# Patient Record
Sex: Female | Born: 1960 | Race: White | Smoking: Current every day smoker
Health system: Southern US, Community
[De-identification: ages and names within clinical notes are randomized; demographics above are authoritative.]

## PROBLEM LIST (undated history)

## (undated) ENCOUNTER — Encounter

## (undated) ENCOUNTER — Ambulatory Visit

## (undated) ENCOUNTER — Telehealth

## (undated) ENCOUNTER — Ambulatory Visit: Payer: Medicare (Managed Care)

## (undated) ENCOUNTER — Encounter
Attending: Student in an Organized Health Care Education/Training Program | Primary: Student in an Organized Health Care Education/Training Program

## (undated) ENCOUNTER — Ambulatory Visit: Payer: MEDICARE

## (undated) ENCOUNTER — Ambulatory Visit
Payer: MEDICARE | Attending: Student in an Organized Health Care Education/Training Program | Primary: Student in an Organized Health Care Education/Training Program

## (undated) DIAGNOSIS — R5383 Other fatigue: Secondary | ICD-10-CM

## (undated) DIAGNOSIS — Z9889 Other specified postprocedural states: Secondary | ICD-10-CM

## (undated) DIAGNOSIS — M1990 Unspecified osteoarthritis, unspecified site: Secondary | ICD-10-CM

## (undated) DIAGNOSIS — M797 Fibromyalgia: Secondary | ICD-10-CM

## (undated) DIAGNOSIS — K219 Gastro-esophageal reflux disease without esophagitis: Secondary | ICD-10-CM

## (undated) DIAGNOSIS — N200 Calculus of kidney: Secondary | ICD-10-CM

## (undated) DIAGNOSIS — F32A Depression, unspecified: Secondary | ICD-10-CM

## (undated) DIAGNOSIS — I639 Cerebral infarction, unspecified: Secondary | ICD-10-CM

## (undated) DIAGNOSIS — I729 Aneurysm of unspecified site: Secondary | ICD-10-CM

## (undated) DIAGNOSIS — T783XXA Angioneurotic edema, initial encounter: Secondary | ICD-10-CM

## (undated) DIAGNOSIS — M150 Primary generalized (osteo)arthritis: Secondary | ICD-10-CM

## (undated) DIAGNOSIS — F329 Major depressive disorder, single episode, unspecified: Secondary | ICD-10-CM

## (undated) DIAGNOSIS — R42 Dizziness and giddiness: Secondary | ICD-10-CM

## (undated) DIAGNOSIS — N311 Reflex neuropathic bladder, not elsewhere classified: Secondary | ICD-10-CM

## (undated) DIAGNOSIS — Z8489 Family history of other specified conditions: Secondary | ICD-10-CM

## (undated) DIAGNOSIS — Z789 Other specified health status: Secondary | ICD-10-CM

## (undated) DIAGNOSIS — R0602 Shortness of breath: Secondary | ICD-10-CM

## (undated) DIAGNOSIS — I471 Supraventricular tachycardia: Secondary | ICD-10-CM

## (undated) DIAGNOSIS — G43909 Migraine, unspecified, not intractable, without status migrainosus: Secondary | ICD-10-CM

## (undated) DIAGNOSIS — F419 Anxiety disorder, unspecified: Secondary | ICD-10-CM

## (undated) DIAGNOSIS — R51 Headache: Secondary | ICD-10-CM

## (undated) DIAGNOSIS — R112 Nausea with vomiting, unspecified: Secondary | ICD-10-CM

## (undated) HISTORY — DX: Major depressive disorder, single episode, unspecified: F32.9

## (undated) HISTORY — DX: Fibromyalgia: M79.7

## (undated) HISTORY — PX: URETHRA SURGERY: SHX824

## (undated) HISTORY — DX: Cerebral infarction, unspecified: I63.9

## (undated) HISTORY — DX: Dizziness and giddiness: R42

## (undated) HISTORY — DX: Depression, unspecified: F32.A

## (undated) HISTORY — DX: Calculus of kidney: N20.0

## (undated) HISTORY — PX: KIDNEY STONE SURGERY: SHX686

## (undated) HISTORY — PX: BREAST CYST EXCISION: SHX579

## (undated) HISTORY — PX: COLONOSCOPY W/ BIOPSIES AND POLYPECTOMY: SHX1376

## (undated) HISTORY — DX: Angioneurotic edema, initial encounter: T78.3XXA

## (undated) HISTORY — DX: Shortness of breath: R06.02

## (undated) HISTORY — PX: HEMORROIDECTOMY: SUR656

## (undated) HISTORY — PX: TOTAL ABDOMINAL HYSTERECTOMY: SHX209

## (undated) HISTORY — DX: Primary generalized (osteo)arthritis: M15.0

## (undated) HISTORY — DX: Other specified health status: Z78.9

## (undated) HISTORY — DX: Anxiety disorder, unspecified: F41.9

## (undated) HISTORY — DX: Unspecified osteoarthritis, unspecified site: M19.90

## (undated) HISTORY — DX: Reflex neuropathic bladder, not elsewhere classified: N31.1

## (undated) HISTORY — DX: Other fatigue: R53.83

## (undated) HISTORY — DX: Migraine, unspecified, not intractable, without status migrainosus: G43.909

## (undated) HISTORY — DX: Headache: R51

---
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
## 1998-06-13 HISTORY — PX: ABDOMINAL HYSTERECTOMY: SHX81

## 2009-06-13 DIAGNOSIS — G459 Transient cerebral ischemic attack, unspecified: Secondary | ICD-10-CM

## 2009-06-13 DIAGNOSIS — I639 Cerebral infarction, unspecified: Secondary | ICD-10-CM

## 2009-06-13 HISTORY — PX: CARDIOVASCULAR STRESS TEST: SHX262

## 2009-06-13 HISTORY — DX: Cerebral infarction, unspecified: I63.9

## 2009-06-13 HISTORY — DX: Transient cerebral ischemic attack, unspecified: G45.9

## 2011-06-30 ENCOUNTER — Encounter: Payer: Self-pay | Admitting: Cardiovascular Disease

## 2011-06-30 ENCOUNTER — Ambulatory Visit (INDEPENDENT_AMBULATORY_CARE_PROVIDER_SITE_OTHER): Payer: Self-pay | Admitting: Cardiovascular Disease

## 2011-06-30 VITALS — BP 124/78 | HR 96 | Ht 63.5 in | Wt 140.8 lb

## 2011-06-30 DIAGNOSIS — R600 Localized edema: Secondary | ICD-10-CM | POA: Insufficient documentation

## 2011-06-30 DIAGNOSIS — R609 Edema, unspecified: Secondary | ICD-10-CM

## 2011-06-30 NOTE — Progress Notes (Signed)
Kara Ochoa Date of Birth  Dec 22, 1960 [facility]     Kara Ochoa  [address]    [address]   [address] [place], [place]  [address]    [place], [place]  [address] [phone]  Fax  [phone]  [phone]  Fax [phone]   History of Present Illness:  Kara Ochoa is a 51 yo with a hx of fibromyalgia and migraines,  and recent onset total body edema ( arms, hands, legs, ankles) .    She has fairly healthy with the exception of her osteoarthritis. She's gained 40 pounds this past year. Since that time she's had lots of swelling in arms, hands, legs, and ankles.  A venous duplex was negative for DVT.  CT angiogram to chest was negative for pulmonary embolus.  She's had an outside echocardiogram which revealed normal left systolic function with an ejection fraction of 60%. There is no evidence of pulmonary hypertension. There was no ASD or VSD. There was mild tricuspid regurgitation and trace mitral regurgitation.  Her weight gain has been slow and progressive for the past year.   She was given Lasix to take PRN for some leg swelling.  She has intertmittant chest pains .  She has DOE.  She does not get any regular exercise.  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  She's on her feet walking and checking machines 12 hours a day.  She does not get any regular aerobic exercise. She admits to eating a bit of fast food. She smokes about a pack of cigarettes a day.   Current Outpatient Prescriptions  Medication Sig Dispense Refill  . carisoprodol (SOMA) 350 MG tablet Take 350 mg by mouth daily.      . celecoxib (CELEBREX) 200 MG capsule Take 200 mg by mouth 2 (two) times daily.      . cyclobenzaprine (FLEXERIL) 10 MG tablet Take 10 mg by mouth 3 (three) times daily as needed.      . eletriptan (RELPAX) 20 MG tablet One tablet by mouth as needed for migraine headache.  If the headache improves and then returns, dose may be repeated after 2 hours  have elapsed since first dose (do not exceed 80 mg per day). may repeat in 2 hours if necessary      . eletriptan (RELPAX) 40 MG tablet One tablet by mouth as needed for migraine headache.  If the headache improves and then returns, dose may be repeated after 2 hours have elapsed since first dose (do not exceed 80 mg per day). may repeat in 2 hours if necessary      . estradiol (CLIMARA) 0.1 mg/24hr Place 1 patch onto the skin once a week.      Marland Kitchen LORazepam (ATIVAN) 1 MG tablet Take 1 mg by mouth 3 (three) times daily.      Marland Kitchen oxyCODONE (OXYCONTIN) 10 MG 12 hr tablet Take 10 mg by mouth every 12 (twelve) hours as needed.          Allergies  Allergen Reactions  . Penicillins   . Sulfa Drugs Cross Reactors     Past Medical History  Diagnosis Date  . Chest pain     Pressure  . SOB (shortness of breath)   . Lightheadedness   . Headache   . Mini stroke 2011  . Allergy history unknown   . Constipation   . Fatigue   . Depression   . Arthritis   . Anxiety  Past Surgical History  Procedure Date  . Cardiovascular stress test 2011    with IV   . Abdominal hysterectomy 2000    History  Smoking status  . Current Everyday Smoker  Smokeless tobacco  . Not on file    History  Alcohol Use: Not on file    No family history on file.  Reviw of Systems:  Reviewed in the HPI.  All other systems are negative.  Physical Exam: BP 124/78  Pulse 96  Ht 5' 3.5" (1.613 m)  Wt 140 lb 12.8 oz (63.866 kg)  BMI 24.55 kg/m2 The patient is alert and oriented x 3.  The mood and affect are normal.   Skin: warm and dry.  Color is normal.    HEENT:   Normocephalic/atraumatic. She has normal carotids. Neck is supple. There is no JVD.  Lungs: Her lungs are clear.   Heart: Regular rate S1-S2. There is a soft systolic murmur. There is no S3 gallop.    Abdomen: Good bowel sounds. There is no hepatosplenomegaly.  Extremities:  No clubbing cyanosis or edema. Her distal pulses are quite  good.  Neuro:  Her exam is nonfocal. Her gait is normal the    ECG: Normal sinus rhythm. She is nonspecific ST-T wave abnormalities.  Assessment / Plan:

## 2011-06-30 NOTE — Patient Instructions (Signed)
Your physician recommends that you schedule a follow-up appointment in: AS NEEDED BASIS  

## 2011-06-30 NOTE — Assessment & Plan Note (Signed)
Kara Ochoa presents with multiple vague complaints but the primary cardiac question is what to do about her leg edema. She has intermittent edema which I suspect is due to the fact that she stands on her feet for 12 hours a day. She also smokes cigarettes he needs extra salt in the form of fast foods. Her echocardiogram reveals normal left ventricular systolic function. She has mild tricuspid regurgitation but no significant abnormalities that would indicate any real cardiac problem.  I think that her edema is cosmetic edema. I do not think that it needs regular therapy. We discussed the appropriate therapies including leg elevation, compression hose and ambulation. Her medical doctor has given her some Lasix and I think that is fine for temporary [place] but I don't think that is a good long-term solution. She's had venous duplex scan which was negative for DVT. She's also had a CT angio  which is negative for pulmonary embolus.  At this point I don't think that she needs any further cardiac workup. I'll see her again on as-needed basis.

## 2016-12-07 ENCOUNTER — Encounter: Payer: Self-pay | Admitting: Internal Medicine

## 2016-12-07 ENCOUNTER — Ambulatory Visit (INDEPENDENT_AMBULATORY_CARE_PROVIDER_SITE_OTHER): Payer: 59 | Admitting: Internal Medicine

## 2016-12-07 VITALS — BP 110/71 | HR 58 | Ht 63.0 in | Wt 137.4 lb

## 2016-12-07 DIAGNOSIS — R011 Cardiac murmur, unspecified: Secondary | ICD-10-CM | POA: Diagnosis not present

## 2016-12-07 DIAGNOSIS — R0602 Shortness of breath: Secondary | ICD-10-CM

## 2016-12-07 DIAGNOSIS — R079 Chest pain, unspecified: Secondary | ICD-10-CM

## 2016-12-07 DIAGNOSIS — R002 Palpitations: Secondary | ICD-10-CM | POA: Diagnosis not present

## 2016-12-07 NOTE — Patient Instructions (Addendum)
Your physician has requested that you have an echocardiogram @ [address]. Echocardiography is a painless test that uses sound waves to create images of your heart. It provides your doctor with information about the size and shape of your heart and how well your heart's chambers and valves are working. This procedure takes approximately one hour. There are no restrictions for this procedure.  Your physician has requested that you have a lexiscan myoview @ [address]. For further information please visit [email or web address]. Please follow instruction sheet, as given.  Your physician has recommended that you wear an event monitor for 2 weeks - placed @ [address]. Event monitors are medical devices that record the heart's electrical activity. Doctors most often us these monitors to diagnose arrhythmias. Arrhythmias are problems with the speed or rhythm of the heartbeat. The monitor is a small, portable device. You can wear one while you do your normal daily activities. This is usually used to diagnose what is causing palpitations/syncope (passing out).  Your physician recommends that you schedule a follow-up appointment after your testing.   Your physician has recommended you make the following change in your medication:  -- DECREASE metoprolol tartrate to 25mg  twice daily (cut 50mg  tablets in half & take half tablet twice daily)

## 2016-12-07 NOTE — Progress Notes (Signed)
OFFICE CONSULT NOTE  Chief Complaint:  Fluttering, chest pain, fatigue  Primary Care Physician: Shelbie AmmonsHaque, Imran P, MD  HPI:  Kara Ochoa is a 56 y.o. female who is being seen today for the evaluation of fluttering, chest pain, fatigue at the request of Delray AltAnkita Desai, PA. [name] has a complex medical history significant for depression, anxiety, fibromyalgia, chronic headaches, constipation, recent SIBO, history of multiple TIAs and 1 reported stroke, osteoarthritis and recent symptoms of shortness of breath, left chest pain and palpitations. She recently presented to her primary care provider with symptoms of left chest pain and worsening shortness of breath she reported fluttering in her chest. An EKG was performed which was interpreted by the computer as sinus rhythm at 81 with extensive ST and T-wave changes possibly due to ischemia. The EKG is circled in several leads indicating what was interpreted as a flutter waves and the patient was referred to primarily for atrial flutter. I personally reviewed this EKG and disagree with the provider's assessment. I do agree with the computer interpretation that this is a sinus rhythm. I suspect what we were saying was baseline lead artifact. We repeated an EKG today which shows sinus bradycardia and nonspecific ST changes at 58. Heart rate is about 30 points lower this is likely related to her being started on high-dose metoprolol 50 mg twice daily. She was also placed on aspirin which she had previously taken that was removed by her gastroenterologist. This was primarily for stroke prevention given her history of TIA. Incidentally, if this were to be atrial flutter, her CHADSVASC score would likely be 5 or higher based on her TIA/stroke history and being female. Aspirin would not be sufficient anticoagulation to prevent stroke. She subsequently is reporting chest pain. She's describes this as over the left chest her left breast and squeezes her left breast  to describes the symptom. She reports the pain is deep, nonradiating, not worse with exertion or relieved by rest, rated as a 7 or 8 out of 10. She says it comes very intermittently. No clear alleviating factors. There is some associated fatigue recently as well.  PMHx:  Past Medical History:  Diagnosis Date  . Allergy history unknown   . Anxiety   . Arthritis   . Chest pain    Pressure  . Constipation   . Depression   . Fatigue   . Fibromyalgia   . Headache(784.0)   . Kidney stone   . Lightheadedness   . Migraines   . Mini stroke (HCC) 2011  . OA (osteoarthritis)   . SOB (shortness of breath)     Past Surgical History:  Procedure Laterality Date  . ABDOMINAL HYSTERECTOMY  2000  . CARDIOVASCULAR STRESS TEST  2011   with IV     FAMHx:  Family History  Problem Relation Age of Onset  . Cancer Mother   . Depression Mother   . Valvular heart disease Mother        mitral valve reflux  . Other Father        degenerative bone   . Colon cancer Maternal Grandmother   . Stroke Maternal Grandfather   . Thyroid disease Daughter   . Basal cell carcinoma Daughter     SOCHx:   reports that she has been smoking.  She has smoked for the past 38.00 years. She has never used smokeless tobacco. She reports that she does not drink alcohol or use drugs.  ALLERGIES:  Allergies  Allergen Reactions  .  Bee Venom Swelling    Respiratory   . Codeine Hives  . Zonisamide Anaphylaxis    Headache medication  . Penicillins Hives  . Sulfa Drugs Cross Reactors Swelling    Throat swelling   . Hydrocodone Nausea And Vomiting    ROS: Pertinent items noted in HPI and remainder of comprehensive ROS otherwise negative.  HOME MEDS: No current outpatient prescriptions on file prior to visit.   No current facility-administered medications on file prior to visit.     LABS/IMAGING: No results found for this or any previous visit (from the past 48 hour(s)). No results found.  LIPID  PANEL: No results found for: CHOL, TRIG, HDL, CHOLHDL, VLDL, LDLCALC, LDLDIRECT  WEIGHTS: Wt Readings from Last 3 Encounters:  12/07/16 137 lb 6.4 oz (62.3 kg)  06/30/11 140 lb 12.8 oz (63.9 kg)    VITALS: BP 110/71   Pulse (!) 58   Ht 5\' 3"  (1.6 m)   Wt 137 lb 6.4 oz (62.3 kg)   BMI 24.34 kg/m   EXAM: General appearance: alert and no distress Neck: no carotid bruit and no JVD Lungs: clear to auscultation bilaterally Heart: regular rate and rhythm, S1, S2 normal and systolic murmur: early systolic 2/6, blowing at apex Abdomen: soft, non-tender; bowel sounds normal; no masses,  no organomegaly Extremities: extremities normal, atraumatic, no cyanosis or edema Pulses: 2+ and symmetric Skin: Skin color, texture, turgor normal. No rashes or lesions Neurologic: Grossly normal Psych: Pleasant, mildly anxious  EKG: Sinus bradycardia 58, nonspecific ST changes - personally reviewed  ASSESSMENT: 1. Dyspnea and exertion, fatigue, palpitations - left chest pain 2. Inconclusive evidence of atrial flutter 3. Depression/anxiety/fibromyalgia 4. History of recurrent TIA/stroke-unknown etiology  PLAN: 1.   [name] reports worsening dyspnea on exertion, fatigue and palpitations. She is also describing left-sided chest pain. It's not clear this is anginal however she does have cardiac risk factors and prior history of TIA/stroke. At this point I agree with starting low-dose aspirin and continuing it. I do believe she is on too high of a dose of metoprolol. 50 mg twice a day is a very high starting dose for somebody his beta blocker nave. I advised decreasing that dose to 25 mg twice a day. We will plan an echocardiogram to look for structural changes to explain her dyspnea and left-sided chest pain. I also like to obtain a Lexiscan Myoview's issues and unable to walk on a treadmill to evaluate for any ischemic causes of her chest pain. Finally with regard to palpitations, I disagree with the  interpretation of her EKG indicating atrial flutter. This is more likely to be baseline lead artifact. If one follows the contiguous V2 lead tracing you can see that the atrial flutter ceases prior to the end of the 12-lead with clear P-wave morphologies that are larger than the "presumed flutter waves". The computer interpretation was sinus rhythm as well. We'll plan a 2 week monitor nonetheless.  Follow-up with me afterwards. Thanks as eyes for the kind referral.  TIME: I have spent a total of 60 minutes with patient reviewing hospital notes, telemetry, EKGs, labs and examining the patient as well as establishing an assessment and plan that was discussed with the patient. > 50% of time was spent in direct patient care.   Chrystie Nose, MD, [facility]  [place]  [facility]  Attending Cardiologist  Direct Dial: [phone]  Fax: [phone]  Website:  [email or web address] Nicely Garry Bochicchio 12/07/2016, 5:14 PM

## 2016-12-08 ENCOUNTER — Telehealth: Payer: Self-pay | Admitting: Internal Medicine

## 2016-12-08 NOTE — Telephone Encounter (Signed)
Mrs.Cheema is calling because she has questions about her Lexiscan . Please call

## 2016-12-08 NOTE — Telephone Encounter (Signed)
Phone number on file is wrong. Returned call but goes to another person.

## 2016-12-09 NOTE — Telephone Encounter (Signed)
Number listed was the wrong number. Verified with person spoke with and she stated she had that number for 2 years and it was incorrect, took out of Epic. Called remaining number listed for patient and recording stating call can not be completed.

## 2016-12-12 NOTE — Telephone Encounter (Signed)
Unable to reach pt or leave a message  

## 2016-12-13 ENCOUNTER — Telehealth (HOSPITAL_COMMUNITY): Payer: Self-pay

## 2016-12-13 NOTE — Telephone Encounter (Signed)
Close encounter 

## 2016-12-15 ENCOUNTER — Telehealth (HOSPITAL_COMMUNITY): Payer: Self-pay

## 2016-12-15 NOTE — Telephone Encounter (Signed)
Encounter complete. 

## 2016-12-16 ENCOUNTER — Ambulatory Visit (HOSPITAL_COMMUNITY)
Admission: RE | Admit: 2016-12-16 | Payer: Medicare Other | Source: Ambulatory Visit | Attending: Internal Medicine | Admitting: Internal Medicine

## 2016-12-19 ENCOUNTER — Other Ambulatory Visit (HOSPITAL_COMMUNITY): Payer: PRIVATE HEALTH INSURANCE

## 2016-12-21 ENCOUNTER — Other Ambulatory Visit: Payer: Self-pay

## 2016-12-21 ENCOUNTER — Ambulatory Visit (HOSPITAL_COMMUNITY): Payer: Medicare Other | Attending: Cardiovascular Disease

## 2016-12-21 ENCOUNTER — Ambulatory Visit (INDEPENDENT_AMBULATORY_CARE_PROVIDER_SITE_OTHER): Payer: Medicare Other

## 2016-12-21 DIAGNOSIS — R002 Palpitations: Secondary | ICD-10-CM | POA: Diagnosis not present

## 2016-12-21 DIAGNOSIS — R011 Cardiac murmur, unspecified: Secondary | ICD-10-CM | POA: Diagnosis present

## 2016-12-21 DIAGNOSIS — R079 Chest pain, unspecified: Secondary | ICD-10-CM | POA: Insufficient documentation

## 2016-12-21 DIAGNOSIS — R0602 Shortness of breath: Secondary | ICD-10-CM | POA: Diagnosis not present

## 2016-12-26 ENCOUNTER — Encounter (HOSPITAL_COMMUNITY): Payer: Self-pay | Admitting: Internal Medicine

## 2017-01-02 ENCOUNTER — Ambulatory Visit: Payer: Medicare Other | Admitting: Internal Medicine

## 2017-01-04 ENCOUNTER — Telehealth (HOSPITAL_COMMUNITY): Payer: Self-pay | Admitting: Internal Medicine

## 2017-01-04 NOTE — Telephone Encounter (Signed)
User: Kara Ochoa, [name] A Date/time: 12/26/2016 1:59 PM  Comment: Called pt and her phone is still disconnected so a letter will be mailed out today.   Context: [place] Schedule Orders/Appt Requests Outcome: Not Available  Phone number: [phone] Phone Type: Home Phone  Comm. type: Telephone Call type: Outgoing  Contact: [name], [name] Relation to patient: Self  Letter:      User: Kara Ochoa, [name] A Date/time: 12/20/2016 2:40 PM  Comment: Patient's number is disconnected.  Context: [place] Schedule Orders/Appt Requests Outcome: Missing or Invalid Number  Phone number: [phone] Phone Type: Home Phone  Comm. type: Telephone Call type: Outgoing  Contact: [name], [name] Relation to patient: Self  Letter:       She will be removed from the workqueue.

## 2017-01-05 ENCOUNTER — Ambulatory Visit: Payer: Medicare Other | Admitting: Cardiology

## 2017-01-05 ENCOUNTER — Ambulatory Visit: Payer: Medicare Other | Admitting: Internal Medicine

## 2017-01-06 ENCOUNTER — Telehealth (HOSPITAL_COMMUNITY): Payer: Self-pay | Admitting: Internal Medicine

## 2017-01-06 NOTE — Telephone Encounter (Signed)
Echo was reassuring with normal systolic and diastolic function. We'll proceed without the stress test.  Dr. HRexene Edison

## 2017-01-06 NOTE — Telephone Encounter (Signed)
Patient returned my call in regards to scheduling her Lexiscan. She stated that after calling her insurance to see how much she would be responsible for, that amount came to $1200. She stated that she can not afford that. Please advise.

## 2017-01-06 NOTE — Telephone Encounter (Signed)
Spoke with patient. She cannot afford lexiscan. Advised would need to defer to MD to see if further testing is warranted. Had normal echo.   Message routed

## 2017-01-06 NOTE — Telephone Encounter (Signed)
Follow up   Pt calling back - says to leave the message on the phone because she cant answer

## 2017-01-06 NOTE — Telephone Encounter (Signed)
LMTCB

## 2017-01-09 NOTE — Telephone Encounter (Signed)
Left message for patient that MD said is OK to proceed without stress testing based on echo results

## 2017-01-13 ENCOUNTER — Telehealth: Payer: Self-pay | Admitting: Internal Medicine

## 2017-01-13 NOTE — Telephone Encounter (Signed)
Patient would like to change from Dr. Rennis GoldenHilty  to Dr. Tomie Chinaevankar in the [facility] office. Is this ok?

## 2017-01-16 NOTE — Telephone Encounter (Signed)
Ok with me to switch providers top Dr. Josiah Loboevenkar if she wants.  Dr. Rennis GoldenHilty

## 2017-01-26 ENCOUNTER — Ambulatory Visit: Admission: RE | Admit: 2017-01-26 | Discharge: 2017-01-26 | Disposition: A | Discharge status: Home / Self Care

## 2017-01-26 DIAGNOSIS — M79672 Pain in left foot: Principal | ICD-10-CM

## 2017-01-27 ENCOUNTER — Encounter: Payer: Self-pay | Admitting: Internal Medicine

## 2017-02-02 MED ORDER — OMEPRAZOLE 40 MG CAPSULE,DELAYED RELEASE
ORAL_CAPSULE | 2 refills | 0 days | Status: CP
Start: 2017-02-02 — End: 2017-10-09

## 2017-02-23 NOTE — Telephone Encounter (Signed)
Called and left message for patient to call office on numerous occasions and she has not returned call.Marland Kitchen..Marland Kitchen

## 2017-10-09 MED ORDER — OMEPRAZOLE 40 MG CAPSULE,DELAYED RELEASE
ORAL_CAPSULE | 1 refills | 0 days | Status: CP
Start: 2017-10-09 — End: 2017-11-15

## 2017-11-07 ENCOUNTER — Other Ambulatory Visit (HOSPITAL_COMMUNITY): Payer: Self-pay | Admitting: Interventional Radiology

## 2017-11-07 ENCOUNTER — Telehealth (HOSPITAL_COMMUNITY): Payer: Self-pay

## 2017-11-07 DIAGNOSIS — I671 Cerebral aneurysm, nonruptured: Secondary | ICD-10-CM

## 2017-11-07 NOTE — Telephone Encounter (Signed)
Called to schedule consult, no answer, left vm. AW  

## 2017-11-09 ENCOUNTER — Ambulatory Visit (HOSPITAL_COMMUNITY)
Admission: RE | Admit: 2017-11-09 | Discharge: 2017-11-09 | Disposition: A | Discharge status: Home / Self Care | Payer: Medicare PPO | Source: Ambulatory Visit | Attending: Interventional Radiology | Admitting: Interventional Radiology

## 2017-11-09 DIAGNOSIS — I671 Cerebral aneurysm, nonruptured: Secondary | ICD-10-CM

## 2017-11-09 HISTORY — PX: IR RADIOLOGIST EVAL & MGMT: IMG5224

## 2017-11-10 ENCOUNTER — Encounter (HOSPITAL_COMMUNITY): Payer: Self-pay | Admitting: Interventional Radiology

## 2017-11-10 ENCOUNTER — Other Ambulatory Visit (HOSPITAL_COMMUNITY): Payer: Self-pay | Admitting: Interventional Radiology

## 2017-11-10 DIAGNOSIS — I671 Cerebral aneurysm, nonruptured: Secondary | ICD-10-CM

## 2017-11-11 HISTORY — PX: BRAIN SURGERY: SHX531

## 2017-11-15 ENCOUNTER — Telehealth: Payer: Self-pay | Admitting: Student

## 2017-11-15 MED ORDER — PANTOPRAZOLE 40 MG TABLET,DELAYED RELEASE
ORAL_TABLET | Freq: Two times a day (BID) | ORAL | 1 refills | 0.00000 days | Status: CP
Start: 2017-11-15 — End: 2018-02-13

## 2017-11-15 NOTE — Telephone Encounter (Signed)
Received message from patient regarding Plavix use. Patient was seen in consultation with Dr. Corliss Skainseveshwar 11/09/2017 for management of her right paraophthalmic aneurysm. She is planned for intervention with Dr. Corliss Skainseveshwar 11/23/2017. Patient is currently taking Plavix 75 mg once daily and Aspirin 81 mg once daily.  Patient complains of a sore throat and "choaking" feeling that she believes is due to her Plavix use. States she began taking Plavix Saturday 11/11/2017. States that Sunday 11/12/2017 she developed a sore throat with associated "choaking" feeling. Patient states that when she lays flat, the "choaking" feeling worsens. States she has been taking Plavix once daily since Saturday 11/11/2017. States the sore throat and "choaking" feeling have been stable since Sunday 11/12/2017.  Patient also complains of GERD flare up. States that she was told by Dr. Corliss Skainseveshwar to discontinue Omeprazole due to her Plavix use. States she was supposed to receive a prescription for another GERD medication, but never did.  Discussed case with Dr. Corliss Skainseveshwar.  Informed patient, per Dr. Corliss Skainseveshwar, to discontinue Plavix use immediately. Informed patient that if she does not feel better in 2 days, to see her PCP. Informed patient to call us in 2 days to update her on how she is feeling and we will make a plan regarding her intervention. Informed patient that if her symptoms worsen to go to the ED immediately for evaluation.  Informed patient, per Dr. Corliss Skainseveshwar, that she can restart taking Omeprazole as directed for GERD, as she will no longer be on Plavix.  All questions answered and concerns addressed. Patient conveys understanding and agrees with plan.  Waylan Bogalexandra M Jamien Casanova, PA-C 11/15/2017, 3:47 PM

## 2017-11-16 ENCOUNTER — Other Ambulatory Visit (HOSPITAL_COMMUNITY): Payer: Self-pay

## 2017-11-16 DIAGNOSIS — K219 Gastro-esophageal reflux disease without esophagitis: Secondary | ICD-10-CM | POA: Diagnosis not present

## 2017-11-16 DIAGNOSIS — I671 Cerebral aneurysm, nonruptured: Secondary | ICD-10-CM | POA: Diagnosis not present

## 2017-11-16 DIAGNOSIS — M940 Chondrocostal junction syndrome [Tietze]: Secondary | ICD-10-CM | POA: Diagnosis not present

## 2017-11-16 DIAGNOSIS — Z86010 Personal history of colonic polyps: Secondary | ICD-10-CM | POA: Diagnosis not present

## 2017-11-16 DIAGNOSIS — Z7982 Long term (current) use of aspirin: Secondary | ICD-10-CM | POA: Diagnosis not present

## 2017-11-16 DIAGNOSIS — Z87442 Personal history of urinary calculi: Secondary | ICD-10-CM | POA: Diagnosis not present

## 2017-11-16 DIAGNOSIS — Z91030 Bee allergy status: Secondary | ICD-10-CM | POA: Diagnosis not present

## 2017-11-16 DIAGNOSIS — I729 Aneurysm of unspecified site: Secondary | ICD-10-CM

## 2017-11-16 DIAGNOSIS — Z91018 Allergy to other foods: Secondary | ICD-10-CM | POA: Diagnosis not present

## 2017-11-16 DIAGNOSIS — F17210 Nicotine dependence, cigarettes, uncomplicated: Secondary | ICD-10-CM | POA: Diagnosis not present

## 2017-11-16 DIAGNOSIS — Z79899 Other long term (current) drug therapy: Secondary | ICD-10-CM | POA: Diagnosis not present

## 2017-11-16 DIAGNOSIS — Z7902 Long term (current) use of antithrombotics/antiplatelets: Secondary | ICD-10-CM | POA: Diagnosis not present

## 2017-11-16 DIAGNOSIS — M797 Fibromyalgia: Secondary | ICD-10-CM | POA: Diagnosis not present

## 2017-11-16 DIAGNOSIS — F329 Major depressive disorder, single episode, unspecified: Secondary | ICD-10-CM | POA: Diagnosis not present

## 2017-11-16 DIAGNOSIS — G43909 Migraine, unspecified, not intractable, without status migrainosus: Secondary | ICD-10-CM | POA: Diagnosis present

## 2017-11-16 DIAGNOSIS — Z818 Family history of other mental and behavioral disorders: Secondary | ICD-10-CM | POA: Diagnosis not present

## 2017-11-16 LAB — PLATELET INHIBITION P2Y12: Platelet Function  P2Y12: 1 [PRU] — ABNORMAL LOW (ref 194–418)

## 2017-11-21 ENCOUNTER — Telehealth (HOSPITAL_COMMUNITY): Payer: Self-pay | Admitting: *Deleted

## 2017-11-21 ENCOUNTER — Other Ambulatory Visit (HOSPITAL_COMMUNITY): Payer: Self-pay | Admitting: Radiology

## 2017-11-21 DIAGNOSIS — I729 Aneurysm of unspecified site: Secondary | ICD-10-CM

## 2017-11-21 LAB — PLATELET INHIBITION P2Y12: PLATELET FUNCTION P2Y12: 3 [PRU] — AB (ref 194–418)

## 2017-11-21 NOTE — Telephone Encounter (Signed)
called and spoke with patient.  Per Dr. Corliss Skainseveshwar pt needs to take Brilinta Every other day and ASA 81 mg daily.   Pt states she took Brilinta this am. Instructed patient to not take Brilinta until she comes in Thursday and we will recheck P2Y12.  And make decision based on lab work.

## 2017-11-22 ENCOUNTER — Other Ambulatory Visit: Payer: Self-pay | Admitting: Radiology

## 2017-11-22 ENCOUNTER — Other Ambulatory Visit: Payer: Self-pay

## 2017-11-22 ENCOUNTER — Encounter (HOSPITAL_COMMUNITY): Payer: Self-pay | Admitting: Certified Registered Nurse Anesthetist

## 2017-11-22 NOTE — Progress Notes (Signed)
Pt denies SOB, chest pain, and being under the care of a cardiologist. Pt denies having a cardiac cath. Pt made aware to stop taking vitamins, fish oil herbal medications, glucosamine, Apple Cider Vinegar tabs. Do not take any NSAIDs ie: Ibuprofen, Advil, Naproxen (Aleve), Motrin, BC and Goody Powder. Pt stated that she was not instructed to take ASA daily and Brilinta every other day. Pt stated that she was instructed to take a half of Brilinta along with Aspirin daily. Pt stated that she takes both Brilinta and Aspirin in the afternoon and will bring both medications in on the DOS. Pt stated that she was instructed to not check in at admitting but to go to the lab first. ?? [facility] Pt verbalized understanding of all pre-op instructions.

## 2017-11-23 ENCOUNTER — Inpatient Hospital Stay (HOSPITAL_COMMUNITY)
Admission: RE | Admit: 2017-11-23 | Discharge: 2017-11-25 | DRG: 027 | Disposition: A | Discharge status: Home / Self Care | Payer: Medicare Other | Attending: Interventional Radiology | Admitting: Interventional Radiology

## 2017-11-23 ENCOUNTER — Other Ambulatory Visit: Payer: Self-pay

## 2017-11-23 ENCOUNTER — Encounter (HOSPITAL_COMMUNITY)
Admission: RE | Disposition: A | Discharge status: Home / Self Care | Payer: Self-pay | Source: Home / Self Care | Attending: Interventional Radiology

## 2017-11-23 ENCOUNTER — Ambulatory Visit (HOSPITAL_COMMUNITY): Payer: Medicare Other | Admitting: Certified Registered Nurse Anesthetist

## 2017-11-23 ENCOUNTER — Encounter (HOSPITAL_COMMUNITY): Payer: Self-pay | Admitting: *Deleted

## 2017-11-23 ENCOUNTER — Ambulatory Visit (HOSPITAL_COMMUNITY)
Admission: RE | Admit: 2017-11-23 | Discharge: 2017-11-23 | Disposition: A | Discharge status: Home / Self Care | Payer: Medicare Other | Source: Ambulatory Visit | Attending: Interventional Radiology | Admitting: Interventional Radiology

## 2017-11-23 DIAGNOSIS — I671 Cerebral aneurysm, nonruptured: Principal | ICD-10-CM | POA: Diagnosis present

## 2017-11-23 DIAGNOSIS — Z818 Family history of other mental and behavioral disorders: Secondary | ICD-10-CM | POA: Diagnosis not present

## 2017-11-23 DIAGNOSIS — F17210 Nicotine dependence, cigarettes, uncomplicated: Secondary | ICD-10-CM | POA: Diagnosis not present

## 2017-11-23 DIAGNOSIS — Z7902 Long term (current) use of antithrombotics/antiplatelets: Secondary | ICD-10-CM | POA: Diagnosis not present

## 2017-11-23 DIAGNOSIS — F329 Major depressive disorder, single episode, unspecified: Secondary | ICD-10-CM | POA: Diagnosis present

## 2017-11-23 DIAGNOSIS — Z7982 Long term (current) use of aspirin: Secondary | ICD-10-CM

## 2017-11-23 DIAGNOSIS — Z87442 Personal history of urinary calculi: Secondary | ICD-10-CM | POA: Diagnosis not present

## 2017-11-23 DIAGNOSIS — M797 Fibromyalgia: Secondary | ICD-10-CM | POA: Diagnosis not present

## 2017-11-23 DIAGNOSIS — Z91030 Bee allergy status: Secondary | ICD-10-CM | POA: Diagnosis not present

## 2017-11-23 DIAGNOSIS — Z86010 Personal history of colonic polyps: Secondary | ICD-10-CM

## 2017-11-23 DIAGNOSIS — Z91018 Allergy to other foods: Secondary | ICD-10-CM

## 2017-11-23 DIAGNOSIS — G43909 Migraine, unspecified, not intractable, without status migrainosus: Secondary | ICD-10-CM | POA: Diagnosis present

## 2017-11-23 DIAGNOSIS — K219 Gastro-esophageal reflux disease without esophagitis: Secondary | ICD-10-CM | POA: Diagnosis not present

## 2017-11-23 DIAGNOSIS — M940 Chondrocostal junction syndrome [Tietze]: Secondary | ICD-10-CM | POA: Diagnosis present

## 2017-11-23 DIAGNOSIS — Z79899 Other long term (current) drug therapy: Secondary | ICD-10-CM

## 2017-11-23 HISTORY — DX: Gastro-esophageal reflux disease without esophagitis: K21.9

## 2017-11-23 HISTORY — PX: IR ANGIO VERTEBRAL SEL SUBCLAVIAN INNOMINATE UNI R MOD SED: IMG5365

## 2017-11-23 HISTORY — PX: IR ANGIO VERTEBRAL SEL VERTEBRAL UNI L MOD SED: IMG5367

## 2017-11-23 HISTORY — DX: Aneurysm of unspecified site: I72.9

## 2017-11-23 HISTORY — DX: Family history of other specified conditions: Z84.89

## 2017-11-23 HISTORY — DX: Other specified postprocedural states: Z98.890

## 2017-11-23 HISTORY — PX: IR ANGIO INTRA EXTRACRAN SEL INTERNAL CAROTID UNI R MOD SED: IMG5362

## 2017-11-23 HISTORY — PX: IR ANGIOGRAM FOLLOW UP STUDY: IMG697

## 2017-11-23 HISTORY — PX: IR 3D INDEPENDENT WKST: IMG2385

## 2017-11-23 HISTORY — PX: IR ANGIO INTRA EXTRACRAN SEL COM CAROTID INNOMINATE UNI L MOD SED: IMG5358

## 2017-11-23 HISTORY — DX: Other specified postprocedural states: R11.2

## 2017-11-23 HISTORY — PX: RADIOLOGY WITH ANESTHESIA: SHX6223

## 2017-11-23 HISTORY — DX: Cerebral infarction, unspecified: I63.9

## 2017-11-23 HISTORY — PX: IR TRANSCATH/EMBOLIZ: IMG695

## 2017-11-23 LAB — CBC WITH DIFFERENTIAL/PLATELET
ABS IMMATURE GRANULOCYTES: 0 10*3/uL (ref 0.0–0.1)
Basophils Absolute: 0.1 10*3/uL (ref 0.0–0.1)
Basophils Relative: 1 %
Eosinophils Absolute: 0.1 10*3/uL (ref 0.0–0.7)
Eosinophils Relative: 2 %
HEMATOCRIT: 39 % (ref 36.0–46.0)
Hemoglobin: 12.9 g/dL (ref 12.0–15.0)
IMMATURE GRANULOCYTES: 1 %
LYMPHS ABS: 3.4 10*3/uL (ref 0.7–4.0)
Lymphocytes Relative: 47 %
MCH: 32.4 pg (ref 26.0–34.0)
MCHC: 33.1 g/dL (ref 30.0–36.0)
MCV: 98 fL (ref 78.0–100.0)
MONO ABS: 0.7 10*3/uL (ref 0.1–1.0)
MONOS PCT: 9 %
NEUTROS ABS: 2.8 10*3/uL (ref 1.7–7.7)
NEUTROS PCT: 40 %
Platelets: 235 10*3/uL (ref 150–400)
RBC: 3.98 MIL/uL (ref 3.87–5.11)
RDW: 13.6 % (ref 11.5–15.5)
WBC: 7.1 10*3/uL (ref 4.0–10.5)

## 2017-11-23 LAB — BASIC METABOLIC PANEL
ANION GAP: 11 (ref 5–15)
BUN: 21 mg/dL — ABNORMAL HIGH (ref 6–20)
CHLORIDE: 107 mmol/L (ref 101–111)
CO2: 26 mmol/L (ref 22–32)
CREATININE: 0.73 mg/dL (ref 0.44–1.00)
Calcium: 8.9 mg/dL (ref 8.9–10.3)
GFR calc non Af Amer: >60 mL/min (ref >60)
Glucose, Bld: 97 mg/dL (ref 65–99)
Potassium: 3.4 mmol/L — ABNORMAL LOW (ref 3.5–5.1)
Sodium: 144 mmol/L (ref 135–145)

## 2017-11-23 LAB — PROTIME-INR
INR: 0.91
Prothrombin Time: 12.1 seconds (ref 11.4–15.2)

## 2017-11-23 LAB — POCT ACTIVATED CLOTTING TIME
ACTIVATED CLOTTING TIME: 158 s
ACTIVATED CLOTTING TIME: 202 s

## 2017-11-23 LAB — MRSA PCR SCREENING: MRSA by PCR: NEGATIVE

## 2017-11-23 LAB — PLATELET INHIBITION P2Y12
PLATELET FUNCTION P2Y12: 5 [PRU] — AB (ref 194–418)
Platelet Function  P2Y12: 227 [PRU] (ref 194–418)

## 2017-11-23 LAB — HEPARIN LEVEL (UNFRACTIONATED): Heparin Unfractionated: 0.11 IU/mL — ABNORMAL LOW (ref 0.30–0.70)

## 2017-11-23 SURGERY — IR WITH ANESTHESIA
Anesthesia: General

## 2017-11-23 MED ORDER — PANTOPRAZOLE SODIUM 40 MG PO TBEC: 40.0000 mg | DELAYED_RELEASE_TABLET | Freq: Every day | ORAL | Status: DC

## 2017-11-23 MED ORDER — SUGAMMADEX SODIUM 200 MG/2ML IV SOLN
INTRAVENOUS | Status: DC | PRN
  Administered 2017-11-23: 200 mg via INTRAVENOUS

## 2017-11-23 MED ORDER — HEPARIN (PORCINE) IN NACL 100-0.45 UNIT/ML-% IJ SOLN
INTRAMUSCULAR | Status: AC
  Filled 2017-11-23: qty 250

## 2017-11-23 MED ORDER — PHENYLEPHRINE HCL 10 MG/ML IJ SOLN
INTRAVENOUS | Status: DC | PRN
  Administered 2017-11-23: 50 ug/min via INTRAVENOUS

## 2017-11-23 MED ORDER — GABAPENTIN 100 MG PO CAPS: 100.0000 mg | ORAL_CAPSULE | Freq: Three times a day (TID) | ORAL | Status: DC

## 2017-11-23 MED ORDER — PROTAMINE SULFATE 10 MG/ML IV SOLN
INTRAVENOUS | Status: DC | PRN
  Administered 2017-11-23: 5 mg via INTRAVENOUS

## 2017-11-23 MED ORDER — ASPIRIN 81 MG PO CHEW
81.0000 mg | CHEWABLE_TABLET | Freq: Every day | ORAL | Status: DC
  Administered 2017-11-24 – 2017-11-25 (×2): 81 mg via ORAL
  Filled 2017-11-23 (×2): qty 1

## 2017-11-23 MED ORDER — DIVALPROEX SODIUM 250 MG PO DR TAB
250.0000 mg | DELAYED_RELEASE_TABLET | Freq: Every day | ORAL | Status: DC
  Administered 2017-11-24 – 2017-11-25 (×2): 250 mg via ORAL
  Filled 2017-11-23 (×2): qty 1

## 2017-11-23 MED ORDER — HEPARIN (PORCINE) IN NACL 100-0.45 UNIT/ML-% IJ SOLN
500.0000 [IU]/h | INTRAMUSCULAR | Status: DC
  Filled 2017-11-23: qty 250

## 2017-11-23 MED ORDER — MIDAZOLAM HCL 2 MG/2ML IJ SOLN
INTRAMUSCULAR | Status: AC
  Filled 2017-11-23: qty 2

## 2017-11-23 MED ORDER — PROMETHAZINE HCL 25 MG PO TABS: 25.0000 mg | ORAL_TABLET | Freq: Four times a day (QID) | ORAL | Status: DC | PRN

## 2017-11-23 MED ORDER — TICAGRELOR 90 MG PO TABS
180.0000 mg | ORAL_TABLET | ORAL | Status: AC
  Administered 2017-11-23: 180 mg via ORAL
  Filled 2017-11-23: qty 2

## 2017-11-23 MED ORDER — SODIUM CHLORIDE 0.9 % IV SOLN: INTRAVENOUS | Status: DC

## 2017-11-23 MED ORDER — ASPIRIN 81 MG PO CHEW
81.0000 mg | CHEWABLE_TABLET | Freq: Once | ORAL | Status: AC
  Administered 2017-11-23: 81 mg via ORAL
  Filled 2017-11-23: qty 1

## 2017-11-23 MED ORDER — TICAGRELOR 90 MG PO TABS: 90.0000 mg | ORAL_TABLET | Freq: Two times a day (BID) | ORAL | Status: DC

## 2017-11-23 MED ORDER — FENTANYL CITRATE (PF) 100 MCG/2ML IJ SOLN: 25.0000 ug | INTRAMUSCULAR | Status: DC | PRN

## 2017-11-23 MED ORDER — ONDANSETRON HCL 4 MG/2ML IJ SOLN
INTRAMUSCULAR | Status: DC | PRN
  Administered 2017-11-23: 4 mg via INTRAVENOUS

## 2017-11-23 MED ORDER — VANCOMYCIN HCL 1000 MG IV SOLR
1000.0000 mg | INTRAVENOUS | Status: AC
  Administered 2017-11-23: 1000 mg via INTRAVENOUS

## 2017-11-23 MED ORDER — NICARDIPINE HCL IN NACL 20-0.86 MG/200ML-% IV SOLN
0.0000 mg/h | INTRAVENOUS | Status: DC
  Filled 2017-11-23: qty 200

## 2017-11-23 MED ORDER — DEXAMETHASONE SODIUM PHOSPHATE 10 MG/ML IJ SOLN
INTRAMUSCULAR | Status: DC | PRN
  Administered 2017-11-23: 10 mg via INTRAVENOUS

## 2017-11-23 MED ORDER — HEPARIN SODIUM (PORCINE) 1000 UNIT/ML IJ SOLN
INTRAMUSCULAR | Status: DC | PRN
  Administered 2017-11-23 (×3): 500 [IU] via INTRAVENOUS
  Administered 2017-11-23: 1000 [IU] via INTRAVENOUS
  Administered 2017-11-23: 2000 [IU] via INTRAVENOUS

## 2017-11-23 MED ORDER — LIDOCAINE HCL 1 % IJ SOLN
INTRAMUSCULAR | Status: AC
  Filled 2017-11-23: qty 20

## 2017-11-23 MED ORDER — ACETAMINOPHEN 650 MG RE SUPP: 650.0000 mg | RECTAL | Status: DC | PRN

## 2017-11-23 MED ORDER — DIVALPROEX SODIUM 250 MG PO DR TAB: 250.0000 mg | DELAYED_RELEASE_TABLET | ORAL | Status: DC

## 2017-11-23 MED ORDER — VENLAFAXINE HCL ER 150 MG PO CP24: 150.0000 mg | ORAL_CAPSULE | Freq: Every day | ORAL | Status: DC

## 2017-11-23 MED ORDER — ASPIRIN 81 MG PO CHEW: 81.0000 mg | CHEWABLE_TABLET | Freq: Every day | ORAL | Status: DC

## 2017-11-23 MED ORDER — ACETAMINOPHEN 160 MG/5ML PO SOLN: 650.0000 mg | ORAL | Status: DC | PRN

## 2017-11-23 MED ORDER — SODIUM CHLORIDE 0.9 % IV SOLN
INTRAVENOUS | Status: DC
  Administered 2017-11-23 – 2017-11-24 (×2): via INTRAVENOUS

## 2017-11-23 MED ORDER — MIDAZOLAM HCL 5 MG/5ML IJ SOLN
INTRAMUSCULAR | Status: DC | PRN
  Administered 2017-11-23 (×3): 1 mg via INTRAVENOUS

## 2017-11-23 MED ORDER — VENLAFAXINE HCL ER 150 MG PO CP24
150.0000 mg | ORAL_CAPSULE | Freq: Every day | ORAL | Status: DC
  Administered 2017-11-23 – 2017-11-24 (×2): 150 mg via ORAL
  Filled 2017-11-23 (×3): qty 1

## 2017-11-23 MED ORDER — LIDOCAINE HCL (CARDIAC) PF 100 MG/5ML IV SOSY
PREFILLED_SYRINGE | INTRAVENOUS | Status: DC | PRN
  Administered 2017-11-23: 60 mg via INTRAVENOUS

## 2017-11-23 MED ORDER — CALCIUM 1200 1200-1000 MG-UNIT PO CHEW: 1.0000 | CHEWABLE_TABLET | Freq: Every day | ORAL | Status: DC

## 2017-11-23 MED ORDER — LORAZEPAM 1 MG PO TABS
1.0000 mg | ORAL_TABLET | Freq: Two times a day (BID) | ORAL | Status: DC
  Administered 2017-11-24 – 2017-11-25 (×3): 1 mg via ORAL
  Filled 2017-11-23 (×3): qty 1

## 2017-11-23 MED ORDER — LORAZEPAM 1 MG PO TABS: 1.0000 mg | ORAL_TABLET | Freq: Two times a day (BID) | ORAL | Status: DC

## 2017-11-23 MED ORDER — ACETAMINOPHEN 325 MG PO TABS
650.0000 mg | ORAL_TABLET | ORAL | Status: DC | PRN
  Administered 2017-11-23 – 2017-11-25 (×6): 650 mg via ORAL
  Filled 2017-11-23 (×6): qty 2

## 2017-11-23 MED ORDER — IOHEXOL 300 MG/ML  SOLN
300.0000 mL | Freq: Once | INTRAMUSCULAR | Status: AC | PRN
  Administered 2017-11-23: 160 mL via INTRA_ARTERIAL

## 2017-11-23 MED ORDER — LACTATED RINGERS IV SOLN
INTRAVENOUS | Status: DC | PRN
  Administered 2017-11-23: 08:00:00 via INTRAVENOUS

## 2017-11-23 MED ORDER — TICAGRELOR 90 MG PO TABS: 45.0000 mg | ORAL_TABLET | Freq: Every day | ORAL | Status: DC

## 2017-11-23 MED ORDER — ROCURONIUM BROMIDE 100 MG/10ML IV SOLN
INTRAVENOUS | Status: DC | PRN
  Administered 2017-11-23: 60 mg via INTRAVENOUS

## 2017-11-23 MED ORDER — PANTOPRAZOLE SODIUM 40 MG PO TBEC
40.0000 mg | DELAYED_RELEASE_TABLET | Freq: Every day | ORAL | Status: DC
  Administered 2017-11-24 – 2017-11-25 (×2): 40 mg via ORAL
  Filled 2017-11-23 (×2): qty 1

## 2017-11-23 MED ORDER — FENTANYL CITRATE (PF) 100 MCG/2ML IJ SOLN
INTRAMUSCULAR | Status: DC | PRN
  Administered 2017-11-23 (×2): 50 ug via INTRAVENOUS
  Administered 2017-11-23: 75 ug via INTRAVENOUS
  Administered 2017-11-23: 25 ug via INTRAVENOUS

## 2017-11-23 MED ORDER — CALCIUM CARBONATE-VITAMIN D 500-200 MG-UNIT PO TABS
1.0000 | ORAL_TABLET | Freq: Every day | ORAL | Status: DC
  Administered 2017-11-24 – 2017-11-25 (×2): 1 via ORAL
  Filled 2017-11-23 (×3): qty 1

## 2017-11-23 MED ORDER — PROPOFOL 10 MG/ML IV BOLUS
INTRAVENOUS | Status: DC | PRN
  Administered 2017-11-23: 130 mg via INTRAVENOUS

## 2017-11-23 MED ORDER — LIDOCAINE HCL (PF) 1 % IJ SOLN
INTRAMUSCULAR | Status: DC | PRN
  Administered 2017-11-23: 10 mL

## 2017-11-23 MED ORDER — LACTATED RINGERS IV SOLN
INTRAVENOUS | Status: DC | PRN
  Administered 2017-11-23: 09:00:00 via INTRAVENOUS

## 2017-11-23 MED ORDER — DIVALPROEX SODIUM 500 MG PO DR TAB
500.0000 mg | DELAYED_RELEASE_TABLET | Freq: Every day | ORAL | Status: DC
  Administered 2017-11-23 – 2017-11-24 (×2): 500 mg via ORAL
  Filled 2017-11-23 (×3): qty 1

## 2017-11-23 MED ORDER — FENTANYL CITRATE (PF) 100 MCG/2ML IJ SOLN
INTRAMUSCULAR | Status: AC
  Filled 2017-11-23: qty 2

## 2017-11-23 MED ORDER — TICAGRELOR 90 MG PO TABS
90.0000 mg | ORAL_TABLET | Freq: Two times a day (BID) | ORAL | Status: DC
  Administered 2017-11-23 – 2017-11-25 (×4): 90 mg via ORAL
  Filled 2017-11-23 (×4): qty 1

## 2017-11-23 MED ORDER — ASPIRIN EC 81 MG PO TBEC: 81.0000 mg | DELAYED_RELEASE_TABLET | Freq: Every day | ORAL | Status: DC

## 2017-11-23 MED ORDER — PROMETHAZINE HCL 25 MG PO TABS
25.0000 mg | ORAL_TABLET | Freq: Four times a day (QID) | ORAL | Status: DC | PRN
  Administered 2017-11-24: 25 mg via ORAL
  Filled 2017-11-23: qty 1

## 2017-11-23 MED ORDER — GABAPENTIN 100 MG PO CAPS
100.0000 mg | ORAL_CAPSULE | Freq: Three times a day (TID) | ORAL | Status: DC
  Administered 2017-11-23 – 2017-11-25 (×6): 100 mg via ORAL
  Filled 2017-11-23 (×6): qty 1

## 2017-11-23 NOTE — Progress Notes (Addendum)
MD Deveshwar arrived in room at 0945. Timeout done after. Patient under care of CRNA. RN in room to assist.

## 2017-11-23 NOTE — Procedures (Signed)
S/P 4 vessel cerebral arteriogram  RT CFA approach. Findings. 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 S/P endovasculaR TREATMENT OF rt ica PERIOPHTALMIC ANEURYSM WITH X1 PIPELINE FLEX DEVICE  WITH STASIS

## 2017-11-23 NOTE — Progress Notes (Signed)
Saw patient in neuro ICU following procedure. Patient underwent cerebral angiogram with embolization of right ICA periophthalmic aneurysm this AM with Dr. Corliss Skainseveshwar.  Patient awake and alert laying in bed. Complains of headache, rated 5/10 at this time. Denies weakness, numbness/tingling, dizziness, vision changes, hearing changes, tinnitus, or speech difficulty.  Alert, awake, and oriented x3. Speech and comprehension intact. PERRL bilaterally. EOMs intact bilaterally without nystagmus or subjective diplopia. Visual fields not assessed. No facial asymmetry. Tongue midline. Motor power symmetric proportional to effort. No pronator drift. Fine motor and coordination intact and symmetric. Gait not assessed. Romberg not assessed. Heel to toe not assessed. Distal pulses 2+ bilaterally. Right groin incision soft without hematoma, minimal oozing noted on gauze.  Plan to stay in neuro ICU for overnight observation. Administer Tylenol PRN headaches. Continue with routine neuro and BP checks.  Patient to lay flat until 1600 today. Ok to take out a-line if causes patient discomfort. IR to follow.  Kara Bogalexandra M Jonte Shiller, PA-C 11/23/2017, 4:01 PM

## 2017-11-23 NOTE — Anesthesia Preprocedure Evaluation (Addendum)
Anesthesia Evaluation  Patient identified by MRN, date of birth, ID band Patient awake    Reviewed: Allergy & Precautions, NPO status , Patient's Chart, lab work & pertinent test results  History of Anesthesia Complications (+) PONV  Airway Mallampati: II  TM Distance: >3 FB Neck ROM: Full    Dental no notable dental hx.    Pulmonary shortness of breath, Current Smoker,    breath sounds clear to auscultation       Cardiovascular  Rhythm:Regular Rate:Normal     Neuro/Psych  Headaches,  Neuromuscular disease CVA    GI/Hepatic GERD  ,  Endo/Other    Renal/GU Renal disease     Musculoskeletal  (+) Arthritis , Fibromyalgia -  Abdominal   Peds  Hematology   Anesthesia Other Findings   Reproductive/Obstetrics                            Anesthesia Physical Anesthesia Plan  ASA: III  Anesthesia Plan: General   Post-op Pain Management:    Induction: Intravenous  PONV Risk Score and Plan: 4 or greater and Treatment may vary due to age or medical condition  Airway Management Planned: Oral ETT  Additional Equipment:   Intra-op Plan:   Post-operative Plan: Extubation in OR  Informed Consent: I have reviewed the patients History and Physical, chart, labs and discussed the procedure including the risks, benefits and alternatives for the proposed anesthesia with the patient or authorized representative who has indicated his/her understanding and acceptance.   Dental advisory given  Plan Discussed with: CRNA  Anesthesia Plan Comments:         Anesthesia Quick Evaluation

## 2017-11-23 NOTE — Sedation Documentation (Signed)
Sheath removed. 6FR angioseal closure device used. Manual pressure being held at right groin site.

## 2017-11-23 NOTE — Progress Notes (Signed)
ANTICOAGULATION CONSULT NOTE - Follow Up Consult  Pharmacy Consult for heparin Indication: s/p cerebral embolization  Labs: Recent Labs    11/23/17 0655 11/23/17 2116  HGB 12.9  --   HCT 39.0  --   PLT 235  --   LABPROT 12.1  --   INR 0.91  --   HEPARINUNFRC  --  0.11*  CREATININE 0.73  --     Assessment/Plan:  57yo female therapeutic on heparin with initial dosing s/p IR. Will continue gtt at current rate until off for sheath removal.   Kara Ochoa, PharmD, BCPS  11/23/2017,11:24 PM

## 2017-11-23 NOTE — Progress Notes (Signed)
ANTICOAGULATION CONSULT NOTE - Initial Consult  Pharmacy Consult for heparin Indication: Post interventional radiology   Allergies  Allergen Reactions  . Bee Venom Anaphylaxis  . Plavix [Clopidogrel Bisulfate] Shortness Of Breath and Other (See Comments)    QUESTIONABLE ASSOCIATION WITH SYMPTOMS Over the course of a week it made her sore throat and difficulty breathing, believes throat was slowly swelling. Starting a z-pack today 11/17/17 to see if sore throat subsides.  Louanne Skye [Pickled Meat] Swelling    Lips, legs, arms swell  . Sulfa Drugs Cross Reactors Anaphylaxis  . Zonisamide Anaphylaxis and Other (See Comments)    Headache medication  . Codeine Hives  . Penicillins Hives and Other (See Comments)    Has patient had a PCN reaction causing immediate rash, facial/tongue/throat swelling, SOB or lightheadedness with hypotension: Unknown Has patient had a PCN reaction causing severe rash involving mucus membranes or skin necrosis: No Has patient had a PCN reaction that required hospitalization: No Has patient had a PCN reaction occurring within the last 10 years: No If all of the above answers are "NO", then may proceed with Cephalosporin use.   Marland Kitchen Hydrocodone Nausea And Vomiting    Patient Measurements: Height: '5\' 3"'  (160 cm) Weight: 125 lb (56.7 kg) IBW/kg (Calculated) : 52.4 Heparin Dosing Weight: 56.7 kg   Vital Signs: Temp: 98.6 F (37 C) (06/13 1257) Temp Source: Oral (06/13 8177) BP: 104/66 (06/13 1343) Pulse Rate: 79 (06/13 1345)  Labs: Recent Labs    11/23/17 0655  HGB 12.9  HCT 39.0  PLT 235  LABPROT 12.1  INR 0.91  CREATININE 0.73    Estimated Creatinine Clearance: 65.7 mL/min (by C-G formula based on SCr of 0.73 mg/dL).   Medical History: Past Medical History:  Diagnosis Date  . Allergy history unknown   . Aneurysm ([place])   . Anxiety   . Arthritis   . Chest pain    Pressure  . Constipation   . CVA (cerebral vascular accident) ([place])    2016, 2018  . Depression   . Family history of adverse reaction to anesthesia    pt had mother had PONV, difficulty waking up  . Fatigue   . Fibromyalgia   . GERD (gastroesophageal reflux disease)   . Headache(784.0)   . Kidney stone   . Lightheadedness   . Migraines   . Mini stroke ([place]) 2011  . OA (osteoarthritis)   . PONV (postoperative nausea and vomiting)   . SOB (shortness of breath)     Medications:  Medications Prior to Admission  Medication Sig Dispense Refill Last Dose  . aspirin EC 81 MG tablet Take 81 mg by mouth daily.   11/22/2017 at Unknown time  . Calcium Carbonate-Vit D-Min (CALCIUM 1200) 1200-1000 MG-UNIT CHEW Chew 1 tablet by mouth daily.   11/22/2017 at Unknown time  . Cholecalciferol (VITAMIN D PO) Take 5,000 Units by mouth daily.    11/22/2017 at Unknown time  . Cyanocobalamin (B-12) 1000 MCG/ML KIT Inject 1 mL as directed every 30 (thirty) days.   11/22/2017 at Unknown time  . divalproex (DEPAKOTE) 250 MG DR tablet Take 250-500 mg by mouth See admin instructions. 1 tablet in the morning, 2 tablets in the evening    11/23/2017 at 0415  . gabapentin (NEURONTIN) 100 MG capsule Take 100 mg by mouth 3 (three) times daily.   11/23/2017 at 0415  . Lactobacillus (ACIDOPHILUS PO) Take 1 capsule by mouth daily. Probiotic 10 Billion +   Past Week at Unknown  time  . LORazepam (ATIVAN) 1 MG tablet Take 1 mg by mouth 2 (two) times daily. Pt does take 0.5 mg in the morning   11/23/2017 at 0415  . Misc Natural Products (APPLE CIDER VINEGAR DIET PO) Take 1 tablet by mouth daily.   11/22/2017 at Unknown time  . omeprazole (PRILOSEC) 40 MG capsule Take 40 mg by mouth 2 (two) times daily.   11/23/2017 at 0415  . Oxycodone HCl 10 MG TABS Take 10 mg by mouth every 8 (eight) hours as needed (pain).    11/22/2017 at Unknown time  . promethazine (PHENERGAN) 25 MG tablet Take 25 mg by mouth every 6 (six) hours as needed for nausea or vomiting.   Past Month at Unknown time  . ticagrelor (BRILINTA)  90 MG TABS tablet Take 45 mg by mouth daily.   Past Week at Unknown time  . venlafaxine XR (EFFEXOR-XR) 150 MG 24 hr capsule Take 150 mg by mouth at bedtime.    11/22/2017 at Unknown time  . venlafaxine XR (EFFEXOR-XR) 75 MG 24 hr capsule Take 75 capsules by mouth at bedtime.    11/22/2017 at Unknown time  . vitamin B-12 (CYANOCOBALAMIN) 1000 MCG tablet Take 1,000 mcg by mouth daily.   11/22/2017 at Unknown time  . vitamin E 400 UNIT capsule Take 400 Units by mouth daily.   11/22/2017 at Unknown time  . EPINEPHrine (EPIPEN 2-PAK) 0.3 mg/0.3 mL IJ SOAJ injection Inject 0.3 mg into the muscle continuous as needed.   More than a month at Unknown time    Assessment: 80 YOF s/p 4 vessel cerebral arteriogram s/p pipeline flex device on IV heparin at 500 units/hr. H/H and Plt wnl. SCr wnl   Goal of Therapy:  Heparin level 0.1 - 0.25 units/ml Monitor platelets by anticoagulation protocol: Yes   Plan:  -Continue IV heparin at 500 units/hr. Infusion to stop at 0800 tomorrow for sheath removal  -F/u 6 hr HL  Albertina Parr, PharmD., BCPS Clinical Pharmacist Clinical phone for 11/23/17 until 10:30pm: [phone] If after 10:30pm, please call main pharmacy at: [phone]

## 2017-11-23 NOTE — Plan of Care (Signed)
Pt tolerating clear liquid diet at this time

## 2017-11-23 NOTE — Sedation Documentation (Signed)
Nurse preformed ACT I stat on patient per Dr Corliss Skainseveshwar order. Result was 158 and told to MD during procedure. Patient remains under care of CRNA

## 2017-11-23 NOTE — Anesthesia Postprocedure Evaluation (Signed)
Anesthesia Post Note  Patient: Kara Ochoa  Procedure(s) Performed: EMBOLIZATION (N/A )     Patient location during evaluation: PACU Anesthesia Type: General Level of consciousness: awake and sedated Pain management: pain level controlled Vital Signs Assessment: post-procedure vital signs reviewed and stable Respiratory status: spontaneous breathing, nonlabored ventilation, respiratory function stable and patient connected to nasal cannula oxygen Cardiovascular status: blood pressure returned to baseline and stable Postop Assessment: no apparent nausea or vomiting Anesthetic complications: no    Last Vitals:  Vitals:   11/23/17 1343 11/23/17 1345  BP: 104/66   Pulse: 77 79  Resp: 15 17  Temp:    SpO2: 99% 99%    Last Pain:  Vitals:   11/23/17 1400  TempSrc:   PainSc: 0-No pain                 Vitoria Conyer,JAMES TERRILL

## 2017-11-23 NOTE — Transfer of Care (Signed)
Immediate Anesthesia Transfer of Care Note  Patient: Kara Ochoa  Procedure(s) Performed: EMBOLIZATION (N/A )  Patient Location: PACU  Anesthesia Type:General  Level of Consciousness: awake, alert  and oriented  Airway & Oxygen Therapy: Patient Spontanous Breathing and Patient connected to nasal cannula oxygen  Post-op Assessment: Report given to RN, Post -op Vital signs reviewed and stable and Patient moving all extremities X 4  Post vital signs: Reviewed and stable  Last Vitals:  Vitals Value Taken Time  BP 116/52 11/23/2017 12:58 PM  Temp 37 C 11/23/2017 12:57 PM  Pulse 85 11/23/2017  1:04 PM  Resp 15 11/23/2017  1:04 PM  SpO2 98 % 11/23/2017  1:04 PM  Vitals shown include unvalidated device data.  Last Pain:  Vitals:   11/23/17 0700  TempSrc:   PainSc: 4       Patients Stated Pain Goal: 3 (11/23/17 0700)  Complications: No apparent anesthesia complications

## 2017-11-23 NOTE — Sedation Documentation (Signed)
Patient intubated for remainder of procedure. Pt remains under care of CRNA.

## 2017-11-23 NOTE — H&P (Signed)
Chief Complaint: Patient was seen in consultation today for cerebral arteriogram and intracranial aneurysm embolization   Supervising Physician: Luanne Bras  Patient Status: [facility] - Out-pt  History of Present Illness: Kara Ochoa is a 57 y.o. female   Hx migraine headaches Hx CVA 2016; 2018  She has some residual confusion ; speech changes and gait instabilities rarely after 2 CVAs Denies N/V; denies dizziness Using all 4s equally  Was evaluated for recent migraine headache that lasted several days MRI performed 5/1:  IMPRESSION: MRI HEAD 1. Normal noncontrast MRI head, stable examination. MRA HEAD: 1. 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 2. Otherwise normal MRA head.  Was seen in consultation with Dr Estanislado Pandy 11/09/17 Now scheduled for cerebral arteriogram with possible embolization of Right paraophthalmic artery aneurysm  LD Plavix 6/5 LD Brilinta 6/11  Past Medical History:  Diagnosis Date  . Allergy history unknown   . Aneurysm ([place])   . Anxiety   . Arthritis   . Chest pain    Pressure  . Constipation   . CVA (cerebral vascular accident) ([place])    2016, 2018  . Depression   . Family history of adverse reaction to anesthesia    pt had mother had PONV, difficulty waking up  . Fatigue   . Fibromyalgia   . GERD (gastroesophageal reflux disease)   . Headache(784.0)   . Kidney stone   . Lightheadedness   . Migraines   . Mini stroke ([place]) 2011  . OA (osteoarthritis)   . PONV (postoperative nausea and vomiting)   . SOB (shortness of breath)     Past Surgical History:  Procedure Laterality Date  . ABDOMINAL HYSTERECTOMY  2000  . BREAST CYST EXCISION     lanced   . CARDIOVASCULAR STRESS TEST  2011   with IV   . COLONOSCOPY W/ BIOPSIES AND POLYPECTOMY    . HEMORROIDECTOMY    . IR RADIOLOGIST EVAL & MGMT  11/09/2017    Allergies: Bee venom; Plavix [clopidogrel bisulfate]; Sausage [pickled meat]; Sulfa drugs cross reactors;  Zonisamide; Codeine; Penicillins; and Hydrocodone  Medications: Prior to Admission medications   Medication Sig Start Date End Date Taking? Authorizing Provider  aspirin EC 81 MG tablet Take 81 mg by mouth daily.   Yes [provider]  Calcium Carbonate-Vit D-Min (CALCIUM 1200) 1200-1000 MG-UNIT CHEW Chew 1 tablet by mouth daily.   Yes [provider]  Cholecalciferol (VITAMIN D PO) Take 5,000 Units by mouth daily.    Yes [provider]  Cyanocobalamin (B-12) 1000 MCG/ML KIT Inject 1 mL as directed every 30 (thirty) days.   Yes [provider]  divalproex (DEPAKOTE) 250 MG DR tablet Take 250-500 mg by mouth See admin instructions. 1 tablet in the morning, 2 tablets in the evening    Yes [provider]  gabapentin (NEURONTIN) 100 MG capsule Take 100 mg by mouth 3 (three) times daily.   Yes [provider]  Lactobacillus (ACIDOPHILUS PO) Take 1 capsule by mouth daily. Probiotic 10 Billion +   Yes [provider]  LORazepam (ATIVAN) 1 MG tablet Take 1 mg by mouth 2 (two) times daily. Pt does take 0.5 mg in the morning   Yes [provider]  Misc Natural Products (APPLE CIDER VINEGAR DIET PO) Take 1 tablet by mouth daily.   Yes [provider]  omeprazole (PRILOSEC) 40 MG capsule Take 40 mg by mouth 2 (two) times daily.   Yes [provider]  Oxycodone HCl  10 MG TABS Take 10 mg by mouth every 8 (eight) hours as needed (pain).    Yes [provider]  promethazine (PHENERGAN) 25 MG tablet Take 25 mg by mouth every 6 (six) hours as needed for nausea or vomiting.   Yes [provider]  ticagrelor (BRILINTA) 90 MG TABS tablet Take 45 mg by mouth daily.   Yes [provider]  venlafaxine XR (EFFEXOR-XR) 150 MG 24 hr capsule Take 150 mg by mouth at bedtime.    Yes [provider]  venlafaxine XR (EFFEXOR-XR) 75 MG 24 hr capsule Take 75 capsules by mouth at bedtime.  06/19/17  Yes  [provider]  vitamin B-12 (CYANOCOBALAMIN) 1000 MCG tablet Take 1,000 mcg by mouth daily.   Yes [provider]  vitamin E 400 UNIT capsule Take 400 Units by mouth daily.   Yes [provider]  EPINEPHrine (EPIPEN 2-PAK) 0.3 mg/0.3 mL IJ SOAJ injection Inject 0.3 mg into the muscle continuous as needed.    [provider]     Family History  Problem Relation Age of Onset  . Cancer Mother   . Depression Mother   . Valvular heart disease Mother        mitral valve reflux  . Other Father        degenerative bone   . Colon cancer Maternal Grandmother   . Stroke Maternal Grandfather   . Thyroid disease Daughter   . Basal cell carcinoma Daughter     Social History   Socioeconomic History  . Marital status: Legally Separated    Spouse name: Not on file  . Number of children: 4  . Years of education: 54  . Highest education level: Not on file  Occupational History  . Occupation: Hotel manager: [employer]  . Financial resource strain: Not on file  . Food insecurity:    Worry: Not on file    Inability: Not on file  . Transportation needs:    Medical: Not on file    Non-medical: Not on file  Tobacco Use  . Smoking status: Current Every Day Smoker    Packs/day: 0.25    Years: 38.00    Pack years: 9.50    Types: Cigarettes  . Smokeless tobacco: Never Used  . Tobacco comment: approx 10 packs every 3 weeks  Substance and Sexual Activity  . Alcohol use: No  . 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 Sexual activity: Not on file  Lifestyle  . Physical activity:    Days per week: Not on file    Minutes per session: Not on file  . Stress: Not on file  Relationships  . Social connections:    Talks on phone: Not on file    Gets together: Not on file    Attends religious service: Not on file    Active member of club or organization: Not on file    Attends meetings of clubs or organizations:  Not on file    Relationship status: Not on file  Other Topics Concern  . Not on file  Social History Narrative  . Not on file    Review of Systems: A 12 point ROS discussed and pertinent positives are indicated in the HPI above.  All other systems are negative.  Review of Systems  Constitutional: Negative for activity change, appetite change, fatigue and fever.  HENT: Negative for tinnitus, trouble swallowing and voice change.  Eyes: Negative for photophobia and visual disturbance.  Respiratory: Negative for cough, choking and shortness of breath.   Cardiovascular: Negative for chest pain.  Gastrointestinal: Negative for abdominal pain.  Musculoskeletal: Positive for gait problem.  Neurological: Positive for speech difficulty and headaches. Negative for dizziness, tremors, seizures, syncope, facial asymmetry, weakness, light-headedness and numbness.       Migraines  Speech with minimal changes  Psychiatric/Behavioral: Negative for behavioral problems, confusion and decreased concentration.    Vital Signs: BP 119/72   Pulse 79   Temp 97.9 F (36.6 C) (Oral)   Ht 5' 3" (1.6 m)   Wt 125 lb (56.7 kg)   SpO2 99%   BMI 22.14 kg/m   Physical Exam  Constitutional: She is oriented to person, place, and time. She appears well-nourished.  HENT:  Head: Atraumatic.  Eyes: EOM are normal.  Neck: Neck supple.  Cardiovascular: Normal rate, regular rhythm and normal heart sounds.  Pulmonary/Chest: Effort normal and breath sounds normal.  Abdominal: Soft. Bowel sounds are normal.  Musculoskeletal: Normal range of motion. She exhibits no edema.  Neurological: She is alert and oriented to person, place, and time.  Skin: Skin is warm and dry.  Ecchymotic areas over body  Psychiatric: She has a normal mood and affect. Her behavior is normal. Judgment and thought content normal.  Nursing note and vitals reviewed.   Imaging: Ir Radiologist Eval & Mgmt  Result Date: 11/10/2017 EXAM:  NEW PATIENT OFFICE VISIT CHIEF COMPLAINT: Headaches.  Recent discovery of a right-sided intracranial aneurysm. 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 The patient does give a history of migraine headaches which appear to be generalized for many years. She reports there is a constant background headache in the parietooccipital regions. Acute exacerbations may last up to 4 days with associated nausea, vomiting and significant debilitation where the patient is nonfunctional. During this time she reports only mild improvement with the Depakote and other medications she takes. Following the headaches, the patient appears spent out and tired. Her most recent episode was about 5 days ago over Memorial Day lasting 4 days again associated with nausea, vomiting, photophobia and significant debilitation. According to the patient she was "unresponsive" for 3 days. Her migraine headaches have been reasonably controlled without undue debilitation up until November of last year. Otherwise, the patient reports no changes in her speech, visual blurring, diplopia, blindness, or loss of consciousness or of seizure-like activity. The patient has had multiple visits with a neurologist and also her internist regarding management of her headaches. 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 Review of systems, otherwise, the patient reports no undue chest pain, shortness of breath or palpitation, coughing, wheezing or of hemoptysis. Her weight is steady, her appetite is within normal limits. She has no abdominal pain, constipation, diarrhea or bloody stools. She denies symptoms of dysuria, hematuria or polyuria. Past Medical History: Past  medical history of fibromyalgia, and measured depression. Surgical History: Hysterectomy, and oophorectomy and hemorrhoidectomy. Medications: Depakote. Ativan as needed. Gabapentin. Oxycodone which she has been taking for many years every 8 hours only when needed. Vitamin D tablets. Vitamin E tablets. Calcium supplementation. Apple vinegar. B12 tablets. Omeprazole. Patient claims to be taking medication for diuresis, with the other combination ingredient being unknown. Allergies:  Bee and wasp stings, sulfa, penicillin, codeine. Social History: Patient has 4 children alive and well. 4 brothers and 3 sisters. She smokes up to a pack every 3-4 days. Denies using illicit chemicals. Says she drinks occasionally. Family History: Asthma. History of headaches. High blood pressure. Kidney disease. Lung disease. Stroke. Thyroid disease. Migraine headaches. Her grandfather died of a ruptured intracranial aneurysm. REVIEW OF SYSTEMS: Negative unless as mentioned above. PHYSICAL EXAMINATION: Somewhat anxious and jittery. Otherwise, affect appropriate for the situation. Neurologically intact without lateralizing cranial nerve, motor, sensory, coordination or station and gait abnormalities. ASSESSMENT AND PLAN: The patient's most recent MRI scan of the brain and MRA of the brain was reviewed with her. Brought to her attention was the saccular outpouching in the region of the right paraophthalmic region measuring approximately 3-3.5 mm. She was informed that is probably represented an intracranial aneurysm. The natural history of unruptured intracranial aneurysms was reviewed with her. Risk of rupture of 1-2% per year with attendant significant mobility and mortality were reviewed. Increased risk of rupture associated with female gender, hypertension, smoking and family history of ruptured or unruptured intracranial aneurysms was discussed. Options considered regarding future management was that of continued surveillance every 6  months to a year with neuroimaging with the patient advised to stop smoking and to continue monitoring her blood pressure. The other option was to consider obliteration of the aneurysm from the parent circulation. The endovascular route and surgical clipping was reviewed with her. The patient exhibited some knowledge of treatment of unruptured intracranial aneurysms. The patient expressed a desire to have this treated endovascularly. The endovascular procedure of treatment would be proceeded by a diagnostic catheter arteriogram. Depending on the findings and the angio architecture of the aneurysm, options of endovascular treatment may include primary coiling, stent assisted coiling versus use of a flow diverter device. Risk of thromboembolic stroke, versus remote possibility of intracranial rupture either during treatment, versus delayed rupture with use of a flow diverter were reviewed in detail. The patient would be preprocedurally prepared with aspirin 81 mg a day, and Plavix 75 mg a day at least 10 days prior to the procedure. Postprocedure the patient would remain in the hospital for at least 24 hours with probable discharge should the patient remain stable without complications. Questions were answered to her satisfaction. The patient would like to proceed with a diagnostic catheter arteriogram with intent to treat. This will be scheduled as soon as possible. In the meantime, she was advised to stop smoking. She was asked to call should she have any concerns or questions. Electronically Signed   By: Luanne Bras M.D.   On: 11/09/2017 15:45    Labs:  CBC: Recent Labs    11/23/17 0655  WBC 7.1  HGB 12.9  HCT 39.0  PLT 235    COAGS: Recent Labs    11/23/17 0655  INR 0.91    BMP: Recent Labs    11/23/17 0655  NA 144  K 3.4*  CL 107  CO2 26  GLUCOSE 97  BUN 21*  CALCIUM 8.9  CREATININE 0.73  GFRNONAA >60  GFRAA >60    LIVER FUNCTION TESTS: No results for input(s):  BILITOT, AST, ALT, ALKPHOS, PROT, ALBUMIN in the last 8760 hours.  TUMOR MARKERS: No results for input(s): AFPTM, CEA, CA199, CHROMGRNA in the last 8760 hours.  Assessment and Plan:  [id number] today LD Plavix 6/5; LD Brilinta 6/11 Hx CVA 2016; 2018 Migraines continue-- most recent few weeks ago MRI reveals  Rt Paraophthalmic artery aneurysm Now scheduled for cerebral arteriogram with possible embolization Risks and benefits of cerebral angiogram with intervention were discussed with the patient including, but not limited to bleeding, infection, vascular injury, contrast induced renal failure, stroke or even death.  This interventional procedure involves the use of X-rays and because of the nature of the planned procedure, it is possible that we will have prolonged use of X-ray fluoroscopy.  Potential radiation risks to you include (but are not limited to) the following: - A slightly elevated risk for cancer  several years later in life. This risk is typically less than 0.5% percent. This risk is low in comparison to the normal incidence of human cancer, which is 33% for women and 50% for men according to the [place]. - Radiation induced injury can include skin redness, resembling a rash, tissue breakdown / ulcers and hair loss (which can be temporary or permanent).   The likelihood of either of these occurring depends on the difficulty of the procedure and whether you are sensitive to radiation due to previous procedures, disease, or genetic conditions.   IF your procedure requires a prolonged use of radiation, you will be notified and given written instructions for further action.  It is your responsibility to monitor the irradiated area for the 2 weeks following the procedure and to notify your physician if you are concerned that you have suffered a radiation induced injury.    All of the patient's questions were answered, patient is agreeable to proceed.  Consent signed  and in chart.  Pt is aware if intervention is performed today-- she will be admitted overnight to Neuro ICU. Plan for DC in am Agreeable to proceed  Thank you for this interesting consult.  I greatly enjoyed meeting Kara Ochoa and look forward to participating in their care.  A copy of this report was sent to the requesting provider on this date.  Electronically Signed: Lavonia Drafts, PA-C 11/23/2017, 8:12 AM   I spent a total of    40 Minutes in face to face in clinical consultation, greater than 50% of which was counseling/coordinating care for cerebral arteriogram with possible intervention

## 2017-11-23 NOTE — Anesthesia Procedure Notes (Signed)
Arterial Line Insertion Start/End6/13/2019 9:00 AM, 11/23/2017 9:10 AM Performed by: Sharee HolsterMassagee, Terry, MD, Epifanio LeschesMercer, Anisha Starliper L, CRNA, CRNA  Preanesthetic checklist: patient identified, IV checked, site marked, risks and benefits discussed, surgical consent, monitors and equipment checked, pre-op evaluation, timeout performed and anesthesia consent Lidocaine 1% used for infiltration and patient sedated Left, radial was placed Catheter size: 20 G Hand hygiene performed , maximum sterile barriers used  and Seldinger technique used Allen's test indicative of satisfactory collateral circulation Attempts: 2 Procedure performed without using ultrasound guided technique. Following insertion, dressing applied and Biopatch. Post procedure assessment: normal  Patient tolerated the procedure well with no immediate complications.

## 2017-11-23 NOTE — Anesthesia Procedure Notes (Signed)
Procedure Name: Intubation Date/Time: 11/23/2017 10:50 AM Performed by: Inda Coke, CRNA Pre-anesthesia Checklist: Patient identified, Emergency Drugs available, Suction available and Patient being monitored Patient Re-evaluated:Patient Re-evaluated prior to induction Oxygen Delivery Method: Circle System Utilized Preoxygenation: Pre-oxygenation with 100% oxygen Induction Type: IV induction Ventilation: Mask ventilation without difficulty Laryngoscope Size: Mac and 3 Grade View: Grade II Tube type: Oral Tube size: 7.0 mm Number of attempts: 1 Airway Equipment and Method: Stylet and Oral airway Placement Confirmation: ETT inserted through vocal cords under direct vision,  positive ETCO2 and breath sounds checked- equal and bilateral Secured at: 22 cm Tube secured with: Tape Dental Injury: Teeth and Oropharynx as per pre-operative assessment  Comments: Performed by Julianne Rice, SRNA

## 2017-11-24 ENCOUNTER — Encounter (HOSPITAL_COMMUNITY): Payer: Self-pay | Admitting: Interventional Radiology

## 2017-11-24 DIAGNOSIS — M940 Chondrocostal junction syndrome [Tietze]: Secondary | ICD-10-CM | POA: Diagnosis not present

## 2017-11-24 DIAGNOSIS — G43909 Migraine, unspecified, not intractable, without status migrainosus: Secondary | ICD-10-CM | POA: Diagnosis not present

## 2017-11-24 DIAGNOSIS — M797 Fibromyalgia: Secondary | ICD-10-CM | POA: Diagnosis not present

## 2017-11-24 DIAGNOSIS — F329 Major depressive disorder, single episode, unspecified: Secondary | ICD-10-CM | POA: Diagnosis not present

## 2017-11-24 DIAGNOSIS — I671 Cerebral aneurysm, nonruptured: Secondary | ICD-10-CM | POA: Diagnosis not present

## 2017-11-24 LAB — CBC WITH DIFFERENTIAL/PLATELET
Abs Immature Granulocytes: 0 10*3/uL (ref 0.0–0.1)
BASOS ABS: 0 10*3/uL (ref 0.0–0.1)
Basophils Relative: 0 %
EOS PCT: 0 %
Eosinophils Absolute: 0 10*3/uL (ref 0.0–0.7)
HEMATOCRIT: 31.2 % — AB (ref 36.0–46.0)
HEMOGLOBIN: 10.2 g/dL — AB (ref 12.0–15.0)
Immature Granulocytes: 1 %
LYMPHS PCT: 20 %
Lymphs Abs: 1.6 10*3/uL (ref 0.7–4.0)
MCH: 32.6 pg (ref 26.0–34.0)
MCHC: 32.7 g/dL (ref 30.0–36.0)
MCV: 99.7 fL (ref 78.0–100.0)
MONO ABS: 0.4 10*3/uL (ref 0.1–1.0)
MONOS PCT: 5 %
Neutro Abs: 5.8 10*3/uL (ref 1.7–7.7)
Neutrophils Relative %: 74 %
Platelets: 198 10*3/uL (ref 150–400)
RBC: 3.13 MIL/uL — ABNORMAL LOW (ref 3.87–5.11)
RDW: 13.6 % (ref 11.5–15.5)
WBC: 7.9 10*3/uL (ref 4.0–10.5)

## 2017-11-24 LAB — BASIC METABOLIC PANEL
Anion gap: 5 (ref 5–15)
BUN: 12 mg/dL (ref 6–20)
CO2: 28 mmol/L (ref 22–32)
Calcium: 8.1 mg/dL — ABNORMAL LOW (ref 8.9–10.3)
Chloride: 109 mmol/L (ref 101–111)
Creatinine, Ser: 0.65 mg/dL (ref 0.44–1.00)
GFR calc Af Amer: >60 mL/min (ref >60)
GLUCOSE: 98 mg/dL (ref 65–99)
POTASSIUM: 3.3 mmol/L — AB (ref 3.5–5.1)
Sodium: 142 mmol/L (ref 135–145)

## 2017-11-24 LAB — PLATELET INHIBITION P2Y12: PLATELET FUNCTION P2Y12: 36 [PRU] — AB (ref 194–418)

## 2017-11-24 MED ORDER — ENSURE ENLIVE PO LIQD
237.0000 mL | Freq: Two times a day (BID) | ORAL | Status: DC
  Administered 2017-11-24 – 2017-11-25 (×2): 237 mL via ORAL

## 2017-11-24 NOTE — Progress Notes (Addendum)
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 Estanislado Pandy.  Dr. Estanislado Pandy was first notified at 0730 of patient's right groin bleeding that had been occurring intermittently all night. Upon assessment this AM, patient noted to have active oozing from her right groin incision.  Patient awake and alert laying in bed. Complains of chest pain, rated 9/10 at this time. States pain is located under her left breast. States pain started sometime last night. States she is not sure if her pain is related to her GERD, as she did not have her medication last night. Denies headache, weakness, numbness/tingling, vision changes, hearing changes, tinnitus, or speech difficulty.  Allergies: Bee venom; Plavix [clopidogrel bisulfate]; Sausage [pickled meat]; Sulfa drugs cross reactors; Zonisamide; Codeine; Penicillins; and Hydrocodone  Medications: Prior to Admission medications   Medication Sig Start Date End Date Taking? Authorizing Provider  aspirin EC 81 MG tablet Take 81 mg by mouth daily.   Yes [provider]  Calcium Carbonate-Vit D-Min (CALCIUM 1200) 1200-1000 MG-UNIT CHEW Chew 1 tablet by mouth daily.   Yes [provider]  Cholecalciferol (VITAMIN D PO) Take 5,000 Units by mouth daily.    Yes [provider]  Cyanocobalamin (B-12) 1000 MCG/ML KIT Inject 1 mL as directed every 30 (thirty) days.   Yes [provider]  divalproex (DEPAKOTE) 250 MG DR tablet Take 250-500 mg by mouth See admin instructions. 1 tablet in the morning, 2 tablets in the evening    Yes [provider]  gabapentin (NEURONTIN) 100 MG capsule Take 100 mg by mouth 3 (three) times daily.   Yes [provider]  Lactobacillus (ACIDOPHILUS PO) Take 1 capsule by mouth daily. Probiotic 10 Billion +   Yes [provider]  LORazepam  (ATIVAN) 1 MG tablet Take 1 mg by mouth 2 (two) times daily. Pt does take 0.5 mg in the morning   Yes [provider]  Misc Natural Products (APPLE CIDER VINEGAR DIET PO) Take 1 tablet by mouth daily.   Yes [provider]  omeprazole (PRILOSEC) 40 MG capsule Take 40 mg by mouth 2 (two) times daily.   Yes [provider]  Oxycodone HCl 10 MG TABS Take 10 mg by mouth every 8 (eight) hours as needed (pain).    Yes [provider]  promethazine (PHENERGAN) 25 MG tablet Take 25 mg by mouth every 6 (six) hours as needed for nausea or vomiting.   Yes [provider]  ticagrelor (BRILINTA) 90 MG TABS tablet Take 45 mg by mouth daily.   Yes [provider]  venlafaxine XR (EFFEXOR-XR) 150 MG 24 hr capsule Take 150 mg by mouth at bedtime.    Yes [provider]  venlafaxine XR (EFFEXOR-XR) 75 MG 24 hr capsule Take 75 capsules by mouth at bedtime.  06/19/17  Yes [provider]  vitamin B-12 (CYANOCOBALAMIN) 1000 MCG tablet Take 1,000 mcg by mouth daily.   Yes [provider]  vitamin E 400 UNIT capsule Take 400 Units by mouth daily.   Yes [provider]  EPINEPHrine (EPIPEN 2-PAK) 0.3 mg/0.3 mL IJ SOAJ injection Inject 0.3 mg into the muscle continuous as needed.    [provider]     Vital Signs: BP 91/76   Pulse 79   Temp 98 F (36.7 C) (Oral)   Resp (!) 21   Ht '5\' 3"'  (1.6 m)  Wt 131 lb 6.3 oz (59.6 kg)   SpO2 100%   BMI 23.28 kg/m   Physical Exam  Constitutional: She is oriented to person, place, and time. She appears well-developed and well-nourished. No distress.  Cardiovascular: Normal rate, regular rhythm and normal heart sounds.  No murmur heard. Pulmonary/Chest: Effort normal and breath sounds normal. No respiratory distress. She has no wheezes.  Neurological: She is alert and oriented to person, place, and time.  Alert, awake, and oriented x3. Speech and comprehension intact. PERRL  bilaterally. EOMs intact bilaterally without nystagmus or subjective diplopia. Visual fields not assessed. No facial asymmetry. Tongue midline. Motor power symmetric proportional to effort. No pronator drift. Fine motor and coordination intact and symmetric. Gait not assessed. Romberg not assessed. Heel to toe not assessed. Distal pulses 2+ bilaterally. Right groin incision soft without hematoma, oozing noted at incision site.  Skin: Skin is warm and dry.  Right groin incision with active oozing, no hematoma.  Psychiatric: She has a normal mood and affect. Her behavior is normal. Judgment and thought content normal.  Nursing note and vitals reviewed.   Imaging: No results found.  Labs:  CBC: Recent Labs    11/23/17 0655 11/24/17 0544  WBC 7.1 7.9  HGB 12.9 10.2*  HCT 39.0 31.2*  PLT 235 198    COAGS: Recent Labs    11/23/17 0655  INR 0.91    BMP: Recent Labs    11/23/17 0655 11/24/17 0544  NA 144 142  K 3.4* 3.3*  CL 107 109  CO2 26 28  GLUCOSE 97 98  BUN 21* 12  CALCIUM 8.9 8.1*  CREATININE 0.73 0.65  GFRNONAA >60 >60  GFRAA >60 >60    LIVER FUNCTION TESTS: No results for input(s): BILITOT, AST, ALT, ALKPHOS, PROT, ALBUMIN in the last 8760 hours.  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 Estanislado Pandy. Dr. Estanislado Pandy and RN held manual pressure with v-pad on right groin site for 30 minutes. Patient to remain flat until 1200 today. Dr. Estanislado Pandy contacted Dr. Einar Gip for cardiology consult. IR to follow.  Electronically Signed: Earley Abide, PA-C 11/24/2017, 9:15 AM   I spent a total of 15 Minutes at the the patient's bedside AND on the patient's hospital floor or unit, greater than 50% of which was counseling/coordinating care for right ICA periophthalmic aneurysm s/p embolization.

## 2017-11-24 NOTE — Progress Notes (Signed)
Went to see patient in neuro ICU. Patient underwent cerebral angiogram with embolization of right ICA periophthalmic aneurysm this AM with Dr. Corliss Skainseveshwar.  Patient awake and alert laying in bed with no complaints at this time. Denies headache, weakness, numbness/tingling, dizziness, vision changes, hearing changes, tinnitus, or speech difficulty.  Alert, awake, and oriented x3. Speech and comprehension intact. PERRL bilaterally. EOMs intact bilaterally without nystagmus or subjective diplopia. Visual fields not assessed. No facial asymmetry. Tongue midline. Motor power symmetric proportional to effort. No pronator drift. Fine motor and coordination intact and symmetric. Gait not assessed. Romberg not assessed. Heel to toe not assessed. Distal pulses 2+ bilaterally. Right groin incision soft without hematoma, oozing noted on vpad and through gauze.  Plan to stay in neuro ICU for overnight observation. Right groin was redressed with new vpad, pressure dressing, and 10 pound sandbag. Continue with routine neuro and BP checks.  Patient to lay flat until 2000 today. Can remove urinary foley at 2000 today. IR to follow.  Kara Bogalexandra M Bernal Luhman, PA-C 11/24/2017, 3:52 PM

## 2017-11-24 NOTE — Consult Note (Signed)
CARDIOLOGY CONSULT NOTE  Patient ID: Kara Ochoa MRN: [id number] DOB/AGE: [birth year] 57 y.o.  Admit date: 11/23/2017 Referring Physician  Juliet Rude, MD Primary Physician:  Raelyn Number, MD Reason for Consultation  Chest pain   HPI: Kara Ochoa  is a 57 y.o. female  Patient with history of fibromyalgia, history of CVA x2 in 2016 in 2018, chronic headaches, migraine headache, history of right paraophthalmic artery aneurysm admitted on 11/23/2017 for cerebral angiography and coiling, underwent successful coiling, patient then complained of chest pain stating that it is 10 out of 10 in intensity that started last night, has been persistent until this morning.  Chest discomfort described as tightness in the middle of the chest and also on the left breast area.  Hard to breathe due to pain.  No acute dyspnea, she does have chronic shortness of breath.  No nausea or vomiting.  No change in her appetite.  Denies any symptoms of claudication, although states that she has mild generalized weakness with difficulty in gait since stroke.  She does smoke but states that she is not going to smoke anymore since the procedure.  No history of hypertension or hyperlipidemia.  Past Medical History:  Diagnosis Date  . Allergy history unknown   . Aneurysm ([place])   . Anxiety   . Arthritis   . Chest pain    Pressure  . Constipation   . CVA (cerebral vascular accident) ([place])    2016, 2018  . Depression   . Family history of adverse reaction to anesthesia    pt had mother had PONV, difficulty waking up  . Fatigue   . Fibromyalgia   . GERD (gastroesophageal reflux disease)   . Headache(784.0)   . Kidney stone   . Lightheadedness   . Migraines   . Mini stroke ([place]) 2011  . OA (osteoarthritis)   . PONV (postoperative nausea and vomiting)   . SOB (shortness of breath)      Past Surgical History:  Procedure Laterality Date  . ABDOMINAL HYSTERECTOMY  2000  . BREAST CYST EXCISION     lanced   .  CARDIOVASCULAR STRESS TEST  2011   with IV   . COLONOSCOPY W/ BIOPSIES AND POLYPECTOMY    . HEMORROIDECTOMY    . IR RADIOLOGIST EVAL & MGMT  11/09/2017     Family History  Problem Relation Age of Onset  . Cancer Mother   . Depression Mother   . Valvular heart disease Mother        mitral valve reflux  . Other Father        degenerative bone   . Colon cancer Maternal Grandmother   . Stroke Maternal Grandfather   . Thyroid disease Daughter   . Basal cell carcinoma Daughter      Social History: Social History   Socioeconomic History  . Marital status: Legally Separated    Spouse name: Not on file  . Number of children: 4  . Years of education: 54  . Highest education level: Not on file  Occupational History  . Occupation: Hotel manager: [employer]  . Financial resource strain: Not on file  . Food insecurity:    Worry: Not on file    Inability: Not on file  . Transportation needs:    Medical: Not on file    Non-medical: Not on file  Tobacco Use  . Smoking status: Current Every Day Smoker    Packs/day: 0.25  Years: 38.00    Pack years: 9.50    Types: Cigarettes  . Smokeless tobacco: Never Used  . Tobacco comment: approx 10 packs every 3 weeks  Substance and Sexual Activity  . Alcohol use: No  . 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 Sexual activity: Not on file  Lifestyle  . Physical activity:    Days per week: Not on file    Minutes per session: Not on file  . Stress: Not on file  Relationships  . Social connections:    Talks on phone: Not on file    Gets together: Not on file    Attends religious service: Not on file    Active member of club or organization: Not on file    Attends meetings of clubs or organizations: Not on file    Relationship status: Not on file  . Intimate partner violence:    Fear of current or ex partner: Not on file    Emotionally abused: Not on file    Physically abused: Not on  file    Forced sexual activity: Not on file  Other Topics Concern  . Not on file  Social History Narrative  . Not on file     Medications Prior to Admission  Medication Sig Dispense Refill Last Dose  . aspirin EC 81 MG tablet Take 81 mg by mouth daily.   11/22/2017 at Unknown time  . Calcium Carbonate-Vit D-Min (CALCIUM 1200) 1200-1000 MG-UNIT CHEW Chew 1 tablet by mouth daily.   11/22/2017 at Unknown time  . Cholecalciferol (VITAMIN D PO) Take 5,000 Units by mouth daily.    11/22/2017 at Unknown time  . Cyanocobalamin (B-12) 1000 MCG/ML KIT Inject 1 mL as directed every 30 (thirty) days.   11/22/2017 at Unknown time  . divalproex (DEPAKOTE) 250 MG DR tablet Take 250-500 mg by mouth See admin instructions. 1 tablet in the morning, 2 tablets in the evening    11/23/2017 at 0415  . gabapentin (NEURONTIN) 100 MG capsule Take 100 mg by mouth 3 (three) times daily.   11/23/2017 at 0415  . Lactobacillus (ACIDOPHILUS PO) Take 1 capsule by mouth daily. Probiotic 10 Billion +   Past Week at Unknown time  . LORazepam (ATIVAN) 1 MG tablet Take 1 mg by mouth 2 (two) times daily. Pt does take 0.5 mg in the morning   11/23/2017 at 0415  . Misc Natural Products (APPLE CIDER VINEGAR DIET PO) Take 1 tablet by mouth daily.   11/22/2017 at Unknown time  . omeprazole (PRILOSEC) 40 MG capsule Take 40 mg by mouth 2 (two) times daily.   11/23/2017 at 0415  . Oxycodone HCl 10 MG TABS Take 10 mg by mouth every 8 (eight) hours as needed (pain).    11/22/2017 at Unknown time  . promethazine (PHENERGAN) 25 MG tablet Take 25 mg by mouth every 6 (six) hours as needed for nausea or vomiting.   Past Month at Unknown time  . ticagrelor (BRILINTA) 90 MG TABS tablet Take 45 mg by mouth daily.   Past Week at Unknown time  . venlafaxine XR (EFFEXOR-XR) 150 MG 24 hr capsule Take 150 mg by mouth at bedtime.    11/22/2017 at Unknown time  . venlafaxine XR (EFFEXOR-XR) 75 MG 24 hr capsule Take 75 capsules by mouth at bedtime.    11/22/2017 at  Unknown time  . vitamin B-12 (CYANOCOBALAMIN) 1000 MCG tablet Take 1,000 mcg by mouth daily.   11/22/2017  at Unknown time  . vitamin E 400 UNIT capsule Take 400 Units by mouth daily.   11/22/2017 at Unknown time  . EPINEPHrine (EPIPEN 2-PAK) 0.3 mg/0.3 mL IJ SOAJ injection Inject 0.3 mg into the muscle continuous as needed.   More than a month at Unknown time   Review of Systems  Constitutional: Positive for malaise/fatigue. Negative for chills and fever.  HENT: Negative.   Eyes: Negative.   Respiratory: Positive for shortness of breath (chronic and stable).   Cardiovascular: Positive for chest pain. Negative for palpitations, orthopnea, claudication, leg swelling and PND.  Gastrointestinal: Negative.   Genitourinary: Negative.   Musculoskeletal: Positive for back pain, joint pain and myalgias. Negative for falls.  Skin: Negative.   Neurological: Positive for headaches. Negative for sensory change, speech change, seizures and loss of consciousness.  Endo/Heme/Allergies: Negative.   Psychiatric/Behavioral: Positive for depression. Negative for hallucinations and substance abuse. The patient is not nervous/anxious.    Physical Exam: Blood pressure 120/65, pulse 65, temperature 98 F (36.7 C), temperature source Oral, resp. rate 15, height '5\' 3"'  (1.6 m), weight 59.6 kg (131 lb 6.3 oz), SpO2 100 %.  .Physical Exam  Constitutional: She is oriented to person, place, and time and well-developed, well-nourished, and in no distress. No distress.  HENT:  Head: Normocephalic and atraumatic.  Eyes: Conjunctivae are normal.  Neck: Normal range of motion. Neck supple.  Cardiovascular: Normal rate, regular rhythm and intact distal pulses. Exam reveals no gallop and no friction rub.  No murmur heard. Pulmonary/Chest: Effort normal and breath sounds normal. She exhibits tenderness (Reproducible left costocholdral junction tenderness present).  Abdominal: Soft. Bowel sounds are normal.  Musculoskeletal:  Normal range of motion. She exhibits no edema or deformity.  Neurological: She is alert and oriented to person, place, and time.  Skin: Skin is warm and dry. She is not diaphoretic.  Psychiatric: Affect normal.  Labs:   Lab Results  Component Value Date   WBC 7.9 11/24/2017   HGB 10.2 (L) 11/24/2017   HCT 31.2 (L) 11/24/2017   MCV 99.7 11/24/2017   PLT 198 11/24/2017    Recent Labs  Lab 11/24/17 0544  NA 142  K 3.3*  CL 109  CO2 28  BUN 12  CREATININE 0.65  CALCIUM 8.1*  GLUCOSE 98   Scheduled Meds: . aspirin  81 mg Oral Daily   Or  . aspirin  81 mg Per Tube Daily  . calcium-vitamin D  1 tablet Oral Q breakfast  . divalproex  250 mg Oral Daily  . divalproex  500 mg Oral QHS  . gabapentin  100 mg Oral TID  . LORazepam  1 mg Oral BID  . pantoprazole  40 mg Oral Daily  . ticagrelor  90 mg Oral BID   Or  . ticagrelor  90 mg Per Tube BID  . venlafaxine XR  150 mg Oral QHS   Continuous Infusions: . sodium chloride 75 mL/hr at 11/24/17 0600  . niCARDipine     PRN Meds:.acetaminophen **OR** acetaminophen (TYLENOL) oral liquid 160 mg/5 mL **OR** acetaminophen, promethazine  CARDIAC STUDIES:  EKG: NSR.  No evidence of ischemia.  Normal QT interval.  Event monitor 12/21/2016: Normal sinus rhythm without significant arrhythmias.  ECHO: 12/21/2016 Left ventricle: The cavity size was normal. Systolic function was  normal. The estimated ejection fraction was in the range of 60%  to 65%. Wall motion was normal; there were no regional wall  motion abnormalities. Left ventricular diastolic function  parameters were  normal. - Aortic valve: Transvalvular velocity was within the normal range.  There was no stenosis. There was no regurgitation. - Mitral valve: Transvalvular velocity was within the normal range.  There was no evidence for stenosis. There was no regurgitation. - Right ventricle: The cavity size was normal. Wall thickness was  normal. Systolic function was normal. -  Right atrium: The atrium was moderately dilated. - Atrial septum: No defect or patent foramen ovale was identified  by color flow Doppler. - Tricuspid valve: There was trivial regurgitation. - Pulmonary arteries: Systolic pressure was within the normal  range. PA peak pressure: 26 mm Hg (S). - Pericardium, extracardiac: A trivial pericardial effusion was  identified.  ASSESSMENT AND PLAN:  1. Musculoskeletal chest pain/Costochondritis 2. Fibromyalgia 3.  Tobacco use disorder 4.  History of CVA and cerebral aneurysm status post coiling of right ICA periophthalmic aneurysm and right MCA trifurcation aneurysm on 11/23/2017.   Rec: I will set her up for stress testing in the outpatient basis and see her back for f/u. Can be discharged from cardiac standpoint. Patient felt reassured.  No changes in the medications were done today.  Again I have discussed with the patient regarding smoking cessation and the importance of smoking cessation especially in view of prior strokes.  Patient states that her lipids are completely normal and was evaluated by her PCP.  I reviewed her echocardiogram and also event monitor that was performed previously.  She has not had any further palpitations.     Adrian Prows, MD 11/24/2017, 9:52 AM [facility] [place] Pager: [phone] Office: [phone] If no answer Cell [phone]

## 2017-11-24 NOTE — Progress Notes (Signed)
Paged Dr. Corliss Skainseveshwar - R groin site oozing blood this morning upon initial assessment with Skeet SimmerFrancisca, RN.  Saturated new dressing between 0700 and 0730. Pt also complaining of left-sided CP this morning, states uncertain if this pain is similar to her reflux or not. Verbal order for 12-lead EKG and request to hold pressure over R groin site for 30 minutes per Dr. Corliss Skainseveshwar. Heparin to be stopped at 0700.

## 2017-11-24 NOTE — Plan of Care (Signed)
Anxiety decreased after pt was given scheduled ativan. Resting comfortably. NAD, VSS.

## 2017-11-24 NOTE — Progress Notes (Signed)
Initial Nutrition Assessment  DOCUMENTATION CODES:   Not applicable  INTERVENTION:  Provide Ensure Enlive po BID, each supplement provides 350 kcal and 20 grams of protein.  Encourage adequate PO intake.   NUTRITION DIAGNOSIS:   Inadequate oral intake related to poor appetite as evidenced by per patient/family report.  GOAL:   Patient will meet greater than or equal to 90% of their needs  MONITOR:   PO intake, Supplement acceptance, Weight trends, Labs, Skin, I & O's  REASON FOR ASSESSMENT:   Malnutrition Screening Tool    ASSESSMENT:   57 y.o. female Hx migraine headaches, CVA presents for scheduled for cerebral arteriogram with possible embolization of Right paraophthalmic artery aneurysm  Right ICA periophthalmic aneurysm s/p embolization 6/13.  Diet advanced to a regular diet this AM. Pt reports difficulties eating at meals due to need to lay flat. Family at bedside has been feeding pt at bedside to encouraged po intake. Pt eats well at home with usual intake of 3 meals a day with no other difficulties. Weight reported to be stable. RD to order nutritional supplements to aid in caloric and protein need.   Unable to complete Nutrition-Focused physical exam at this time as pt requesting rest.   Labs and medications reviewed.   Diet Order:   Diet Order           Diet regular Room service appropriate? Yes; Fluid consistency: Thin  Diet effective now          EDUCATION NEEDS:   Not appropriate for education at this time  Skin:  Skin Assessment: Skin Integrity Issues: Skin Integrity Issues:: Incisions Incisions: groin R  Last BM:  6/12  Height:   Ht Readings from Last 1 Encounters:  11/23/17 5\' 3"  (1.6 m)    Weight:   Wt Readings from Last 1 Encounters:  11/23/17 131 lb 6.3 oz (59.6 kg)    Ideal Body Weight:  52.27 kg  BMI:  Body mass index is 23.28 kg/m.  Estimated Nutritional Needs:   Kcal:  1650-1850  Protein:  75-90 grams  Fluid:   1.6- 1.8 L/day    Roslyn SmilingStephanie Tonya Wantz, MS, RD, LDN Pager # [phone] After hours/ weekend pager # [phone]

## 2017-11-25 DIAGNOSIS — I671 Cerebral aneurysm, nonruptured: Secondary | ICD-10-CM | POA: Diagnosis not present

## 2017-11-25 DIAGNOSIS — G43909 Migraine, unspecified, not intractable, without status migrainosus: Secondary | ICD-10-CM | POA: Diagnosis not present

## 2017-11-25 LAB — POTASSIUM: Potassium: 3.7 mmol/L (ref 3.5–5.1)

## 2017-11-25 MED ORDER — POTASSIUM CHLORIDE 10 MEQ/100ML IV SOLN
10.0000 meq | INTRAVENOUS | Status: DC
  Administered 2017-11-25: 10 meq via INTRAVENOUS
  Filled 2017-11-25 (×2): qty 100

## 2017-11-25 MED ORDER — POTASSIUM CHLORIDE CRYS ER 10 MEQ PO TBCR
10.0000 meq | EXTENDED_RELEASE_TABLET | Freq: Once | ORAL | Status: AC
  Administered 2017-11-25: 10 meq via ORAL
  Filled 2017-11-25: qty 1

## 2017-11-25 NOTE — Discharge Instructions (Addendum)
Endovascular Therapy for Cerebral Aneurysm, Care After °This sheet gives you information about how to care for yourself after your procedure. Your health care provider may also give you more specific instructions. If you have problems or questions, contact your health care provider. °What can I expect after the procedure? °After the procedure, it is common to have: °· Pain, tenderness, and swelling around your incision. °· Headaches. ° °Follow these instructions at home: °Medicines °· Take over-the-counter and prescription medicines only as told by your health care provider. °· If you were prescribed medicines to prevent blood clots (antiplatelet medicines), talk with your health care provider about the risks. These medicines can increase bleeding, so you may need to avoid certain activities. °Incision care °· Follow instructions from your health care provider about how to take care of your incision. Make sure you: °? Wash your hands with soap and water before you change your bandage (dressing). If soap and water are not available, use hand sanitizer. °? Change your dressing as told by your health care provider. °? Leave stitches (sutures), skin glue, or adhesive strips in place. These skin closures may need to stay in place for 2 weeks or longer. If adhesive strip edges start to loosen and curl up, you may trim the loose edges. Do not remove adhesive strips completely unless your health care provider tells you to do that. °· Check your incision area every day for signs of infection. Check for: °? Redness, swelling, or pain. °? Fluid or blood. °? Warmth. °? Pus or a bad smell. °Activity °· Ask your health care provider what activities are safe for you during recovery. Most people can return to normal activities 2-6 weeks after the procedure. °· Do not drive until your health care provider approves. °· Do not drive or use heavy machinery while taking prescription pain medicine. °· Do not lift anything that is heavier  than 10 lb (4.5 kg), or the limit that you are told, until your health care provider says that it is safe. °· Exercise regularly, as directed by your health care provider. °· Attend rehabilitation therapy as told by your health care provider. This may include: °? Physical and occupational therapy. °? Speech-language therapy. °? Brain exercises. °? Balance exercises. °? Individual or group therapy. °? Education about your condition and treatment. °Eating and drinking °· Drink enough fluid to keep your urine pale yellow. °· Eat a healthy diet. This includes plenty of fruits and vegetables, whole grains, low-fat dairy products, and lean protein. °General instructions °· Do not use any products that contain nicotine or tobacco, such as cigarettes and e-cigarettes. If you need help quitting, ask your health care provider. °· Do not take baths, swim, or use a hot tub until your health care provider approves. Ask your health care provider if you may take showers. You may only be allowed to take sponge baths for bathing. °· Manage your stress. If you need help with this, talk with your health care provider. °· Wear compression stockings as told by your health care provider. These stockings help to prevent blood clots and reduce swelling in your legs. °· Keep all follow-up visits as told by your health care provider. This is important. °Contact a health care provider if: °· You have redness, swelling, or pain around your incision. °· You have fluid or blood coming from your incision. °· Your incision feels warm to the touch. °· You have pus or a bad smell coming from your incision. °· You have   a fever. °Get help right away if: °· You have: °? Stiffness in your neck. °? Pain, numbness, weakness, or swelling in your legs. °? Severe chest pain. °? Difficulty breathing. °? Confusion. °· You have any symptoms of stroke. "BE FAST" is an easy way to remember the main warning signs of stroke: °? B - Balance. Signs are dizziness,  sudden trouble walking, or loss of balance. °? E - Eyes. Signs are trouble seeing or a sudden change in vision. °? F - Face. Signs are sudden weakness or numbness of the face, or the face or eyelid drooping on one side. °? A - Arms. Signs are weakness or numbness in an arm. This happens suddenly and usually on one side of the body. °? S - Speech. Signs are sudden trouble speaking, slurred speech, or trouble understanding what people say. °? T - Time. Time to call emergency services. Write down what time symptoms started. °· You have other signs of stroke, such as: °? A sudden, severe headache that does not get better with medicine. °? Sudden nausea or vomiting. °? A seizure. °These symptoms may represent a serious problem that is an emergency. Do not wait to see if the symptoms will go away. Get medical help right away. Call your local emergency services (911 in the [place] Do not drive yourself to the hospital. °Summary °· After this procedure, it is common to have some pain and swelling around your incision. °· Follow instructions from your health care provider about how to take care of your incision. Check for signs of infection every day. °· Most people can return to normal activities 2-6 weeks after the procedure. Ask your health care provider what activities are safe for you during recovery. °· Do not drive until your health care provider approves. Do not drive while taking prescription pain medicine. °· Do not use any products that contain nicotine or tobacco, such as cigarettes and e-cigarettes. If you need help quitting, ask your health care provider. °This information is not intended to replace advice given to you by your health care provider. Make sure you discuss any questions you have with your health care provider. °Document Released: 09/05/2016 Document Revised: 09/05/2016 Document Reviewed: 09/05/2016 °Elsevier Interactive Patient Education © 2018 [employer] ° °

## 2017-11-25 NOTE — Progress Notes (Signed)
Saw patient in neuro ICU. Patient underwent cerebral angiogram with embolization of right ICA periophthalmic aneurysm this AM with Dr. Corliss Skainseveshwar. She is planned for potential discharge today.  Patient awake and alert laying in bed with no complaints at this time. Denies headache, weakness, numbness/tingling, dizziness, vision changes, hearing changes, tinnitus, or speech difficulty.  Right groin incision was noted to be oozing though bandage. Patient states this must have happened when she got up to use the bathroom. Manual pressure was held with v-pad for 20 minutes. Hemostasis achieved at 0930 and pressure bandage was applied with 10 pound sand bag. Distal pulses intact bilaterally.  Potassium 3.3 11/24/2017. Will order Potassium chloride IV.  Patient to remain flat until 1330 today with sand bag in place. Will recheck groin in approximately 4 hours and re-evaluate for discharge at that time.  Waylan Bogalexandra M Jewell Haught, PA-C 11/25/2017, 9:47 AM

## 2017-11-25 NOTE — Progress Notes (Signed)
RN has reviewed discharge instructions with pt as well as pt father who is present at the bedside and both fully understood instructions. Pt verbalizes that she will call to schedule a follow up visit with Dr. Corliss Skainseveshwar in 2 weeks. She also has been instructed to take her next dose of Brilinta this evening at 10pm which she has already at home. VSS. Drsg over pt R groin is CDI, no oozing or hematoma observed. Pt will ambulate in the hall before discharge home. All pt belongings are present.

## 2017-11-25 NOTE — Discharge Summary (Signed)
Patient ID: Kara Ochoa MRN: [id number] DOB/AGE: 07-11-1960 57 y.o.  Admit date: 11/23/2017 Discharge date: 11/25/2017  Supervising Physician: Luanne Bras  Patient Status: [facility] - In-pt  Admission Diagnoses: Right ICA periophthalmic aneurysm  Discharge Diagnoses:  Active Problems:   Brain aneurysm   Discharged Condition: stable  Hospital Course:  Patient underwent cerebral angiogram with embolization of right ICA periophthalmic aneurysm 11/23/2017 with Dr. Estanislado Pandy. Procedure occurred without complications, VSS, right groin incision stable. Patient was transferred to neuro ICU for overnight observation.  On the morning of 11/24/2017, patient complained of 9/10 chest pain. Cardiology was consulted and Dr. Einar Gip evaluated patient. She was found to have costochondritis and is to follow-up with Dr. Einar Gip after discharge.  Dr. Estanislado Pandy was first notified 11/24/2017 at approximately 07:30 that patient's right groin incision was oozing intermittently overnight. Upon assessment, patient found to have active oozing of right groin incision, no hematoma noted. Manual pressure with v-pad was held by Dr. Estanislado Pandy and RN. Right groin incision continued to ooze, so patient was kept in neuro ICU for overnight observation.  Upon morning assessment on 11/25/2017, patient noted to still have intermittent oozing of right groin incision, no hematoma noted. Manual pressure and v-pad was held by me for 20 minutes with hemostasis achieved at 0930 this AM. Patient was to remain flat for another 4 hours, and at this time her groin site was soft without active bleeding or hematoma.   Patient's potassium found to be 3.3 on 11/24/2017. Per Dr. Estanislado Pandy, Potassium Chloride administered 11/25/2017. Potassium to be re-drawn prior to discharge.  Plan to discharge home today and follow-up with Dr. Estanislado Pandy in clinic in 2 weeks.  Consults: cardiology  Significant Diagnostic Studies: Ir Radiologist Eval &  Mgmt  Result Date: 11/10/2017 EXAM: NEW PATIENT OFFICE VISIT CHIEF COMPLAINT: Headaches.  Recent discovery of a right-sided intracranial aneurysm. 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 The patient does give a history of migraine headaches which appear to be generalized for many years. She reports there is a constant background headache in the parietooccipital regions. Acute exacerbations may last up to 4 days with associated nausea, vomiting and significant debilitation where the patient is nonfunctional. During this time she reports only mild improvement with the Depakote and other medications she takes. Following the headaches, the patient appears spent out and tired. Her most recent episode was about 5 days ago over Memorial Day lasting 4 days again associated with nausea, vomiting, photophobia and significant debilitation. According to the patient she was "unresponsive" for 3 days. Her migraine headaches have been reasonably controlled without undue debilitation up until November of last year. Otherwise, the patient reports no changes in her speech, visual blurring, diplopia, blindness, or loss of consciousness or of seizure-like activity. The patient has had multiple visits with a neurologist and also her internist regarding management of her headaches. 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 Review of systems, otherwise, the patient reports no undue chest pain, shortness of breath or palpitation, coughing, wheezing or of hemoptysis. Her weight is steady, her appetite is within normal limits. She has no abdominal pain, constipation, diarrhea or bloody stools. She denies symptoms of dysuria, hematuria or  polyuria. Past Medical History: Past medical history of fibromyalgia, and measured depression. Surgical History:  Hysterectomy, and oophorectomy and hemorrhoidectomy. Medications: Depakote. Ativan as needed. Gabapentin. Oxycodone which she has been taking for many years every 8 hours only when needed. Vitamin D tablets. Vitamin E tablets. Calcium supplementation. Apple vinegar. B12 tablets. Omeprazole. Patient claims to be taking medication for diuresis, with the other combination ingredient being unknown. Allergies: Bee and wasp stings, sulfa, penicillin, codeine. Social History: Patient has 4 children alive and well. 4 brothers and 3 sisters. She smokes up to a pack every 3-4 days. Denies using illicit chemicals. Says she drinks occasionally. Family History: Asthma. History of headaches. High blood pressure. Kidney disease. Lung disease. Stroke. Thyroid disease. Migraine headaches. Her grandfather died of a ruptured intracranial aneurysm. REVIEW OF SYSTEMS: Negative unless as mentioned above. PHYSICAL EXAMINATION: Somewhat anxious and jittery. Otherwise, affect appropriate for the situation. Neurologically intact without lateralizing cranial nerve, motor, sensory, coordination or station and gait abnormalities. ASSESSMENT AND PLAN: The patient's most recent MRI scan of the brain and MRA of the brain was reviewed with her. Brought to her attention was the saccular outpouching in the region of the right paraophthalmic region measuring approximately 3-3.5 mm. She was informed that is probably represented an intracranial aneurysm. The natural history of unruptured intracranial aneurysms was reviewed with her. Risk of rupture of 1-2% per year with attendant significant mobility and mortality were reviewed. Increased risk of rupture associated with female gender, hypertension, smoking and family history of ruptured or unruptured intracranial aneurysms was discussed. Options considered regarding future management was that  of continued surveillance every 6 months to a year with neuroimaging with the patient advised to stop smoking and to continue monitoring her blood pressure. The other option was to consider obliteration of the aneurysm from the parent circulation. The endovascular route and surgical clipping was reviewed with her. The patient exhibited some knowledge of treatment of unruptured intracranial aneurysms. The patient expressed a desire to have this treated endovascularly. The endovascular procedure of treatment would be proceeded by a diagnostic catheter arteriogram. Depending on the findings and the angio architecture of the aneurysm, options of endovascular treatment may include primary coiling, stent assisted coiling versus use of a flow diverter device. Risk of thromboembolic stroke, versus remote possibility of intracranial rupture either during treatment, versus delayed rupture with use of a flow diverter were reviewed in detail. The patient would be preprocedurally prepared with aspirin 81 mg a day, and Plavix 75 mg a day at least 10 days prior to the procedure. Postprocedure the patient would remain in the hospital for at least 24 hours with probable discharge should the patient remain stable without complications. Questions were answered to her satisfaction. The patient would like to proceed with a diagnostic catheter arteriogram with intent to treat. This will be scheduled as soon as possible. In the meantime, she was advised to stop smoking. She was asked to call should she have any concerns or questions. Electronically Signed   By: Luanne Bras M.D.   On: 11/09/2017 15:45    Discharge Exam: Blood pressure 114/68, pulse 68, temperature 97.8 F (36.6 C), temperature source Oral, resp. rate 16, height '5\' 3"'  (1.6 m), weight 131 lb 6.3 oz (59.6 kg), SpO2 96 %. Physical Exam  Constitutional: She appears well-developed and well-nourished. No distress.  Cardiovascular: Normal rate, regular rhythm and  normal heart sounds.  No murmur heard. Pulmonary/Chest: Effort normal and breath sounds normal. No respiratory distress. She has no wheezes.  Neurological:  Alert, awake, and orientedx3. Speech and comprehensionintact. PERRLbilaterally. EOMsintact bilaterally withoutnystagmus  or subjective diplopia. Visual fieldsnot assessed. Nofacial asymmetry. Tongue midline. Motor powersymmetric proportional to effort. Nopronator drift. Fine motor and coordinationintact and symmetric. Gaitnot assessed. Rombergnot assessed. Heel to toenot assessed. Distal pulses2+ bilaterally. Right groin incision soft without active bleeding or hematoma.  Skin: Skin is warm and dry.  Right groin incision soft without active bleeding or hematoma.  Psychiatric: She has a normal mood and affect. Her behavior is normal. Judgment and thought content normal.  Nursing note and vitals reviewed.   Disposition: Discharge disposition: 01-Home or Self Care       Discharge Instructions    Call MD for:  difficulty breathing, headache or visual disturbances   Complete by:  As directed    Call MD for:  extreme fatigue   Complete by:  As directed    Call MD for:  hives   Complete by:  As directed    Call MD for:  persistant dizziness or light-headedness   Complete by:  As directed    Call MD for:  persistant nausea and vomiting   Complete by:  As directed    Call MD for:  redness, tenderness, or signs of infection (pain, swelling, redness, odor or green/yellow discharge around incision site)   Complete by:  As directed    Call MD for:  severe uncontrolled pain   Complete by:  As directed    Call MD for:  temperature >100.4   Complete by:  As directed    Diet - low sodium heart healthy   Complete by:  As directed    Discharge instructions   Complete by:  As directed    No driving self for 2 weeks. No bending, stooping, or lifting more than 10 pounds for 2 weeks. No walking up stairs until  11/26/2017. Stay hydrated by drinking plenty of water. Take Brilinta 90 mg twice daily. Take Aspirin 81 mg once day.   Increase activity slowly   Complete by:  As directed      Allergies as of 11/25/2017      Reactions   Bee Venom Anaphylaxis   Plavix [clopidogrel Bisulfate] Shortness Of Breath, Other (See Comments)   QUESTIONABLE ASSOCIATION WITH SYMPTOMS Over the course of a week it made her sore throat and difficulty breathing, believes throat was slowly swelling. Starting a z-pack today 11/17/17 to see if sore throat subsides.   Sausage [pickled Meat] Swelling   Lips, legs, arms swell   Sulfa Drugs Cross Reactors Anaphylaxis   Zonisamide Anaphylaxis, Other (See Comments)   Headache medication   Codeine Hives   Penicillins Hives, Other (See Comments)   Has patient had a PCN reaction causing immediate rash, facial/tongue/throat swelling, SOB or lightheadedness with hypotension: Unknown Has patient had a PCN reaction causing severe rash involving mucus membranes or skin necrosis: No Has patient had a PCN reaction that required hospitalization: No Has patient had a PCN reaction occurring within the last 10 years: No If all of the above answers are "NO", then may proceed with Cephalosporin use.   Hydrocodone Nausea And Vomiting      Medication List    TAKE these medications   ACIDOPHILUS PO Take 1 capsule by mouth daily. Probiotic 10 Billion +   APPLE CIDER VINEGAR DIET PO Take 1 tablet by mouth daily.   aspirin EC 81 MG tablet Take 81 mg by mouth daily.   B-12 1000 MCG/ML Kit Inject 1 mL as directed every 30 (thirty) days.   vitamin B-12 1000 MCG tablet Commonly  known as:  CYANOCOBALAMIN Take 1,000 mcg by mouth daily.   CALCIUM 1200 1200-1000 MG-UNIT Chew Chew 1 tablet by mouth daily.   divalproex 250 MG DR tablet Commonly known as:  DEPAKOTE Take 250-500 mg by mouth See admin instructions. 1 tablet in the morning, 2 tablets in the evening   EPIPEN 2-PAK 0.3  mg/0.3 mL Soaj injection Generic drug:  EPINEPHrine Inject 0.3 mg into the muscle continuous as needed.   gabapentin 100 MG capsule Commonly known as:  NEURONTIN Take 100 mg by mouth 3 (three) times daily.   LORazepam 1 MG tablet Commonly known as:  ATIVAN Take 1 mg by mouth 2 (two) times daily. Pt does take 0.5 mg in the morning   omeprazole 40 MG capsule Commonly known as:  PRILOSEC Take 40 mg by mouth 2 (two) times daily.   Oxycodone HCl 10 MG Tabs Take 10 mg by mouth every 8 (eight) hours as needed (pain).   promethazine 25 MG tablet Commonly known as:  PHENERGAN Take 25 mg by mouth every 6 (six) hours as needed for nausea or vomiting.   ticagrelor 90 MG Tabs tablet Commonly known as:  BRILINTA Take 45 mg by mouth daily.   venlafaxine XR 150 MG 24 hr capsule Commonly known as:  EFFEXOR-XR Take 150 mg by mouth at bedtime. What changed:  Another medication with the same name was removed. Continue taking this medication, and follow the directions you see here.   VITAMIN D PO Take 5,000 Units by mouth daily.   vitamin E 400 UNIT capsule Take 400 Units by mouth daily.      Follow-up Information    Adrian Prows, MD Follow up on 12/04/2017.   Specialty:  Cardiology Why:  You are scheduled for stress test as discussed on 12/04/2017. Do not eat after midnight and do not drink coffee one day prior and day of stress test. Arrive 15 minutes early. Contact information: [place] [address] [phone]        Luanne Bras, MD Follow up.   Specialties:  Interventional Radiology, Radiology Why:  Please follow-up with Dr. Estanislado Pandy in clinic 2 weeks after discharge. Contact information: [address] [place] [address] [phone]            Electronically Signed: Earley Abide, PA-C 11/25/2017, 1:49 PM   I have spent Less Than 30 Minutes discharging Kara Ochoa.

## 2017-11-27 ENCOUNTER — Telehealth (HOSPITAL_COMMUNITY): Payer: Self-pay

## 2017-11-27 NOTE — Telephone Encounter (Signed)
Called pt to schedule 2 wk f/u. She stated that she is not feeling well at all. She is having bad tremors, headache on the R side, 3 nose bleeds since 3am this morning, and she is having a hard time moving around. She said she had to put on a depends because she is unable to get around fast enough. I spoke to Dr. Corliss Skainseveshwar and he advised the pt to come to the ED. Pt agreed and is headed here now. AW

## 2017-11-28 ENCOUNTER — Encounter (HOSPITAL_COMMUNITY): Payer: Self-pay | Admitting: Interventional Radiology

## 2017-11-29 ENCOUNTER — Other Ambulatory Visit: Payer: Self-pay

## 2017-11-29 ENCOUNTER — Other Ambulatory Visit (HOSPITAL_COMMUNITY): Payer: Self-pay | Admitting: Interventional Radiology

## 2017-11-29 ENCOUNTER — Emergency Department (HOSPITAL_COMMUNITY)
Admission: EM | Admit: 2017-11-29 | Discharge: 2017-11-30 | Disposition: A | Discharge status: Home / Self Care | Payer: Medicare PPO | Attending: Emergency Medicine | Admitting: Emergency Medicine

## 2017-11-29 ENCOUNTER — Telehealth (HOSPITAL_COMMUNITY): Payer: Self-pay | Admitting: *Deleted

## 2017-11-29 ENCOUNTER — Emergency Department (HOSPITAL_COMMUNITY): Payer: Medicare PPO

## 2017-11-29 ENCOUNTER — Encounter (HOSPITAL_COMMUNITY): Payer: Self-pay

## 2017-11-29 DIAGNOSIS — Y92239 Unspecified place in hospital as the place of occurrence of the external cause: Secondary | ICD-10-CM | POA: Insufficient documentation

## 2017-11-29 DIAGNOSIS — F17210 Nicotine dependence, cigarettes, uncomplicated: Secondary | ICD-10-CM | POA: Diagnosis not present

## 2017-11-29 DIAGNOSIS — W460XXA Contact with hypodermic needle, initial encounter: Secondary | ICD-10-CM | POA: Insufficient documentation

## 2017-11-29 DIAGNOSIS — Y998 Other external cause status: Secondary | ICD-10-CM | POA: Insufficient documentation

## 2017-11-29 DIAGNOSIS — I671 Cerebral aneurysm, nonruptured: Secondary | ICD-10-CM

## 2017-11-29 DIAGNOSIS — Y9389 Activity, other specified: Secondary | ICD-10-CM | POA: Insufficient documentation

## 2017-11-29 DIAGNOSIS — Z7982 Long term (current) use of aspirin: Secondary | ICD-10-CM | POA: Insufficient documentation

## 2017-11-29 DIAGNOSIS — S7011XA Contusion of right thigh, initial encounter: Secondary | ICD-10-CM | POA: Insufficient documentation

## 2017-11-29 DIAGNOSIS — Z79899 Other long term (current) drug therapy: Secondary | ICD-10-CM | POA: Diagnosis not present

## 2017-11-29 DIAGNOSIS — R2241 Localized swelling, mass and lump, right lower limb: Secondary | ICD-10-CM | POA: Diagnosis present

## 2017-11-29 DIAGNOSIS — T148XXA Other injury of unspecified body region, initial encounter: Secondary | ICD-10-CM

## 2017-11-29 DIAGNOSIS — R51 Headache: Secondary | ICD-10-CM | POA: Diagnosis not present

## 2017-11-29 DIAGNOSIS — I729 Aneurysm of unspecified site: Secondary | ICD-10-CM

## 2017-11-29 LAB — COMPREHENSIVE METABOLIC PANEL
ALBUMIN: 3.2 g/dL — AB (ref 3.5–5.0)
ALK PHOS: 37 U/L — AB (ref 38–126)
ALT: 14 U/L (ref 14–54)
AST: 17 U/L (ref 15–41)
Anion gap: 7 (ref 5–15)
BILIRUBIN TOTAL: 0.4 mg/dL (ref 0.3–1.2)
BUN: 14 mg/dL (ref 6–20)
CALCIUM: 9.1 mg/dL (ref 8.9–10.3)
CO2: 29 mmol/L (ref 22–32)
CREATININE: 0.67 mg/dL (ref 0.44–1.00)
Chloride: 104 mmol/L (ref 101–111)
GFR calc non Af Amer: >60 mL/min (ref >60)
GLUCOSE: 79 mg/dL (ref 65–99)
Potassium: 4.3 mmol/L (ref 3.5–5.1)
SODIUM: 140 mmol/L (ref 135–145)
TOTAL PROTEIN: 5.8 g/dL — AB (ref 6.5–8.1)

## 2017-11-29 LAB — CBC WITH DIFFERENTIAL/PLATELET
Abs Immature Granulocytes: 0.3 10*3/uL — ABNORMAL HIGH (ref 0.0–0.1)
BASOS ABS: 0.1 10*3/uL (ref 0.0–0.1)
BASOS PCT: 1 %
EOS ABS: 0.2 10*3/uL (ref 0.0–0.7)
Eosinophils Relative: 2 %
HCT: 31.7 % — ABNORMAL LOW (ref 36.0–46.0)
Hemoglobin: 9.6 g/dL — ABNORMAL LOW (ref 12.0–15.0)
Immature Granulocytes: 2 %
Lymphocytes Relative: 27 %
Lymphs Abs: 3.2 10*3/uL (ref 0.7–4.0)
MCH: 32.7 pg (ref 26.0–34.0)
MCHC: 30.3 g/dL (ref 30.0–36.0)
MCV: 107.8 fL — AB (ref 78.0–100.0)
MONO ABS: 1.2 10*3/uL — AB (ref 0.1–1.0)
MONOS PCT: 11 %
Neutro Abs: 6.8 10*3/uL (ref 1.7–7.7)
Neutrophils Relative %: 57 %
PLATELETS: 243 10*3/uL (ref 150–400)
RBC: 2.94 MIL/uL — ABNORMAL LOW (ref 3.87–5.11)
RDW: 13.8 % (ref 11.5–15.5)
WBC: 11.8 10*3/uL — ABNORMAL HIGH (ref 4.0–10.5)

## 2017-11-29 LAB — PROTIME-INR
INR: 0.88
Prothrombin Time: 11.9 seconds (ref 11.4–15.2)

## 2017-11-29 MED ORDER — IOHEXOL 300 MG/ML  SOLN
100.0000 mL | Freq: Once | INTRAMUSCULAR | Status: AC | PRN
  Administered 2017-11-29: 100 mL via INTRAVENOUS

## 2017-11-29 NOTE — ED Provider Notes (Signed)
[facility] EMERGENCY DEPARTMENT Provider Note   CSN: [id number] Arrival date & time: 11/29/17  1934     History   Chief Complaint Chief Complaint  Patient presents with  . Bleeding/Bruising    HPI Kara Ochoa is a 57 y.o. female.  HPI Presents with concern of pain, swelling, discoloration on her right thigh, right labia majora's. Patient has notable history of aneurysm repair via femoral puncture last week. She notes that she had pain in the area on discharge, but subsequently has developed worsening swelling, discomfort, sore, severe, not improved with anything. There is concurrent worsening of discoloration throughout the area, including her right sided labia majora. No distal loss of sensation or strength in the foot. There is some other ecchymosis in the right wrist, without clear precipitant, this is nonpainful, beyond with palpation. No lightheadedness, no chest pain, she does have minor headache. After speaking with her interventional radiology physician she was sent here for evaluation. Past Medical History:  Diagnosis Date  . Allergy history unknown   . Aneurysm ([place])   . Anxiety   . Arthritis   . Chest pain    Pressure  . Constipation   . CVA (cerebral vascular accident) ([place])    2016, 2018  . Depression   . Family history of adverse reaction to anesthesia    pt had mother had PONV, difficulty waking up  . Fatigue   . Fibromyalgia   . GERD (gastroesophageal reflux disease)   . Headache(784.0)   . Kidney stone   . Lightheadedness   . Migraines   . Mini stroke ([place]) 2011  . OA (osteoarthritis)   . PONV (postoperative nausea and vomiting)   . SOB (shortness of breath)     Patient Active Problem List   Diagnosis Date Noted  . Brain aneurysm 11/23/2017  . Chest pain 12/07/2016  . Palpitations 12/07/2016  . Shortness of breath 12/07/2016  . Murmur 12/07/2016  . Leg edema 06/30/2011    Past Surgical History:  Procedure  Laterality Date  . ABDOMINAL HYSTERECTOMY  2000  . BREAST CYST EXCISION     lanced   . CARDIOVASCULAR STRESS TEST  2011   with IV   . COLONOSCOPY W/ BIOPSIES AND POLYPECTOMY    . HEMORROIDECTOMY    . IR 3D INDEPENDENT WKST  11/23/2017  . IR ANGIO INTRA EXTRACRAN SEL COM CAROTID INNOMINATE UNI L MOD SED  11/23/2017  . IR ANGIO INTRA EXTRACRAN SEL INTERNAL CAROTID UNI R MOD SED  11/23/2017  . IR ANGIO VERTEBRAL SEL SUBCLAVIAN INNOMINATE UNI R MOD SED  11/23/2017  . IR ANGIO VERTEBRAL SEL VERTEBRAL UNI L MOD SED  11/23/2017  . IR ANGIOGRAM FOLLOW UP STUDY  11/23/2017  . IR RADIOLOGIST EVAL & MGMT  11/09/2017  . IR TRANSCATH/EMBOLIZ  11/23/2017  . RADIOLOGY WITH ANESTHESIA N/A 11/23/2017   Procedure: EMBOLIZATION;  Surgeon: Luanne Bras, MD;  Location: [facility];  Service: Radiology;  Laterality: N/A;     OB History   None      Home Medications    Prior to Admission medications   Medication Sig Start Date End Date Taking? Authorizing Provider  aspirin EC 81 MG tablet Take 81 mg by mouth daily.    [provider]  Calcium Carbonate-Vit D-Min (CALCIUM 1200) 1200-1000 MG-UNIT CHEW Chew 1 tablet by mouth daily.    [provider]  Cholecalciferol (VITAMIN D PO) Take 5,000 Units by mouth daily.     [provider]  Cyanocobalamin (B-12) 1000 MCG/ML KIT Inject 1 mL as directed every 30 (thirty) days.    [provider]  divalproex (DEPAKOTE) 250 MG DR tablet Take 250-500 mg by mouth See admin instructions. 1 tablet in the morning, 2 tablets in the evening     [provider]  EPINEPHrine (EPIPEN 2-PAK) 0.3 mg/0.3 mL IJ SOAJ injection Inject 0.3 mg into the muscle continuous as needed.    [provider]  gabapentin (NEURONTIN) 100 MG capsule Take 100 mg by mouth 3 (three) times daily.    [provider]  Lactobacillus (ACIDOPHILUS PO) Take 1 capsule by mouth daily. Probiotic 10 Billion +    [provider]  LORazepam  (ATIVAN) 1 MG tablet Take 1 mg by mouth 2 (two) times daily. Pt does take 0.5 mg in the morning    [provider]  Misc Natural Products (APPLE CIDER VINEGAR DIET PO) Take 1 tablet by mouth daily.    [provider]  omeprazole (PRILOSEC) 40 MG capsule Take 40 mg by mouth 2 (two) times daily.    [provider]  Oxycodone HCl 10 MG TABS Take 10 mg by mouth every 8 (eight) hours as needed (pain).     [provider]  promethazine (PHENERGAN) 25 MG tablet Take 25 mg by mouth every 6 (six) hours as needed for nausea or vomiting.    [provider]  ticagrelor (BRILINTA) 90 MG TABS tablet Take 45 mg by mouth daily.    [provider]  venlafaxine XR (EFFEXOR-XR) 150 MG 24 hr capsule Take 150 mg by mouth at bedtime.     [provider]  vitamin B-12 (CYANOCOBALAMIN) 1000 MCG tablet Take 1,000 mcg by mouth daily.    [provider]  vitamin E 400 UNIT capsule Take 400 Units by mouth daily.    [provider]    Family History Family History  Problem Relation Age of Onset  . Cancer Mother   . Depression Mother   . Valvular heart disease Mother        mitral valve reflux  . Other Father        degenerative bone   . Colon cancer Maternal Grandmother   . Stroke Maternal Grandfather   . Thyroid disease Daughter   . Basal cell carcinoma Daughter     Social History Social History   Tobacco Use  . Smoking status: Current Every Day Smoker    Packs/day: 0.25    Years: 38.00    Pack years: 9.50    Types: Cigarettes  . Smokeless tobacco: Never Used  . Tobacco comment: approx 10 packs every 3 weeks  Substance Use Topics  . Alcohol use: No  . Drug use: No    Comment: long term oxycodone use     Allergies   Bee venom; Plavix [clopidogrel bisulfate]; Sausage [pickled meat]; Sulfa drugs cross reactors; Zonisamide; Codeine; Penicillins; and Hydrocodone   Review of Systems Review of Systems  Constitutional:        Per HPI, otherwise negative  HENT:       Per HPI, otherwise negative  Respiratory:       Per HPI, otherwise negative  Cardiovascular:       Per HPI, otherwise negative  Gastrointestinal: Negative for vomiting.  Endocrine:       Negative aside from HPI  Genitourinary:       Neg aside from HPI   Musculoskeletal:       Per HPI, otherwise  negative  Skin: Positive for color change.  Neurological: Negative for syncope.  Hematological: Bruises/bleeds easily.     Physical Exam Updated Vital Signs BP 122/72 (BP Location: Right Arm)   Pulse 68   Temp 98 F (36.7 C) (Oral)   Resp 19   Ht '5\' 3"'  (1.6 m)   Wt 59.4 kg (131 lb)   SpO2 100%   BMI 23.21 kg/m   Physical Exam  Constitutional: She is oriented to person, place, and time. She appears well-developed and well-nourished. No distress.  HENT:  Head: Normocephalic and atraumatic.  Eyes: Conjunctivae and EOM are normal.  Cardiovascular: Normal rate and regular rhythm.  Pulmonary/Chest: Effort normal and breath sounds normal. No stridor. No respiratory distress.  Abdominal: She exhibits no distension.  Musculoskeletal: She exhibits no edema.  Neurological: She is alert and oriented to person, place, and time. No cranial nerve deficit.  Skin: Skin is warm and dry.     Psychiatric: She has a normal mood and affect.  Nursing note and vitals reviewed.    ED Treatments / Results  Labs (all labs ordered are listed, but only abnormal results are displayed) Labs Reviewed  COMPREHENSIVE METABOLIC PANEL  CBC WITH DIFFERENTIAL/PLATELET  PROTIME-INR    EKG None  Radiology No results found.  Procedures Procedures (including critical care time)  Medications Ordered in ED Medications - No data to display   Initial Impression / Assessment and Plan / ED Course  I have reviewed the triage vital signs and the nursing notes.  Pertinent labs & imaging results that were available during my care of the patient were reviewed  by me and considered in my medical decision making (see chart for details).    To the initial evaluation I reviewed the patient's chart including documentation from recent aneurysm repair, and ongoing efforts to appropriately titrate the patient's antiplatelet medication.  This patient presents about 1 week after endovascular repair of aneurysm, now with concern about pain in the right femoral crease, discoloration, swelling. Patient is distally neurovascular intact, has no complaints suggesting CNS pathology, but there is suspicion for pseudoaneurysm versus hematoma contributing to her pain. Initial labs reassuring, no substantial coagulopathy demonstrated, no thrombocytopenia. Patient is awaiting CT scan for further evaluation, characterization of her lesion.  PA Deborah Chalk will follow the results and recheck the patient. Final Clinical Impressions(s) / ED Diagnoses  Post operative pain    Carmin Muskrat, MD 11/29/17 2217

## 2017-11-29 NOTE — Telephone Encounter (Signed)
Called patient she states that her right leg is black and swollen past her knee.  She states there is a golf ball size place at her groin.  I informed patient that she needed to come to the ER as soon as possible.  That we needed to evaluate her leg, that she very well could be bleeding into her leg.  I stressed repeatedly  that she needed to come that this was an emergency.  I discussed these findings with Dr. Corliss Skainseveshwar and he agreed that patient needs to be evaluated on the emergency room

## 2017-11-29 NOTE — ED Triage Notes (Signed)
Patient had brain aneurysm repair on Thursday (1 week ago) and continues to have bruising from femoral artery area to her knee. Also c/o new bruising/swelling to right arm. Patient started Brilinta 4 days before surgery and is taking asa as well.

## 2017-11-30 ENCOUNTER — Emergency Department (HOSPITAL_BASED_OUTPATIENT_CLINIC_OR_DEPARTMENT_OTHER): Payer: Medicare PPO

## 2017-11-30 DIAGNOSIS — Z9889 Other specified postprocedural states: Secondary | ICD-10-CM | POA: Diagnosis not present

## 2017-11-30 MED ORDER — MORPHINE SULFATE (PF) 4 MG/ML IV SOLN: 4.0000 mg | Freq: Once | INTRAVENOUS | Status: DC

## 2017-11-30 MED ORDER — OXYCODONE-ACETAMINOPHEN 5-325 MG PO TABS
2.0000 | ORAL_TABLET | Freq: Once | ORAL | Status: AC
  Administered 2017-11-30: 2 via ORAL
  Filled 2017-11-30: qty 2

## 2017-11-30 NOTE — ED Notes (Signed)
Patient transported to Ultrasound 

## 2017-11-30 NOTE — Discharge Instructions (Signed)
You have been diagnosed with having a hematoma to your right groin.  Please use cool compress or ice pack as needed for pain.  Follow up with your doctor in 1 week for recheck.  Return if you have any concerns.

## 2017-11-30 NOTE — Progress Notes (Signed)
Patient ID: Kara CircleAundrea Ochoa, female   DOB: 10/05/1960, 57 y.o.   MRN: [id number] INR. Called about patient early  am from the ER . She had earlier been advised to come to the ER on  Monday to be evaluated for  right groin pain and swelling ,and difficulty walking. She decided not to. Called again yesterday with worsening groin pain and ecchymosis in the upper medial aspect of her  rght thigh and difficulty  with weight bearing.Marland Kitchen. She was advised again to come to the ER. Had a CT of her abdomen this am which showed a 3.665mm x 2mm hematoma in the rt femoral region.  O/E Is in obvious distress due to the pain in her RT groin. Diffuse ecchymosis of the the RT groin with exquisite tenderness. Hard appro 4cm x 2cm RT infra inguinal mass at groin puncture site. Distal pulses OK  Plan . US of the RT groin to R/O associated pseudoaneurysm. Will probably need vascular surgery consult. D/W patient and family P2 Y 12 . S.Janaa Acero MD

## 2017-11-30 NOTE — Progress Notes (Signed)
*  PRELIMINARY RESULTS* Vascular Ultrasound Limited Right Lower Extremity Duplex has been completed. There is no obvious evidence of pseudoaneurysm. There is a heterogenous avascular area of the right groin measuring 4.5 x 2.0 x 3.8cm, suggestive of hematoma. There is an extension of arterial flow from the right common femoral artery, suggestive of possible needle track. There is no evidence of deep vein thrombosis involving the right common femoral vein.  Preliminary results discussed with Fayrene HelperBowie Tran, PA-C.  11/30/2017 9:51 AM Gertie FeyMichelle Tedford Berg, BS, RVT, RDCS, RDMS

## 2017-11-30 NOTE — ED Provider Notes (Signed)
12:38 AM Patient care assumed from Dr. Jeraldine LootsLockwood at change of shift.  The patient is a 57 year old female who presents for increasing groin pain, swelling, discoloration.  She has a history of aneurysm repair via femoral puncture last week by Dr. Corliss Skainseveshwar.  CT pending at change of shift to evaluate for pseudoaneurysm.  CT findings reviewed.  This shows amorphous soft tissue attenuation in the femoral puncture site suspicious for hematoma measuring 3.5 x 1.6 x 3.5 cm.  No evidence of active hemorrhage.  Radiology is noting possibility of pseudoaneurysm.  Plan to consult vascular surgery to review imaging for this reason.  Clinically, the patient's symptoms do seem more consistent with hematoma.  She has firmness to the area with some discomfort to palpation.  Femoral puncture site with extensive ecchymotic changes extending to the right labia majora, lateral right thigh, and inner and posterior thigh down to the right knee.  Patient neurovascularly intact.  She has been hemodynamically stable.  Patient aware of plan for vascular consultation.  12:47 AM Case discussed with Dr. Darrick PennaFields of vascular surgery.  Dr. Darrick PennaFields recommends discussing case with Dr. Corliss Skainseveshwar given that findings would be secondary to post procedure complication.  If Dr. Corliss Skainseveshwar feels it would be beneficial for VVS to become involved, patient may be able to be seen in OP clinic today/tomorrow.  1:12 AM Case discussed with Dr. Corliss Skainseveshwar. Give CT reading, he requests completion of ultrasound to definitively determine between hematoma or pseudoaneurysm. Patient will require observation admission for completion of this vs ED hold until imaging obtainable in the morning. If pseudoaneurysm, Dr. Corliss Skainseveshwar requests consultation to VVS to evaluate patient for surgical repair. If hematoma, patient appropriate for supportive management and OP follow up as scheduled in 1 week.   Vitals:   11/30/17 0030 11/30/17 0045 11/30/17 0100 11/30/17 0153   BP: 101/67  104/72 101/75  Pulse: 75 75 71 75  Resp:  18 14 18   Temp:      TempSrc:      SpO2: 96% 96% 97% 99%  Weight:      Height:          Antony MaduraHumes, Averil Digman, PA-C 11/30/17 0544    Gwyneth SproutPlunkett, [place], MD 11/30/17 [phone]

## 2017-11-30 NOTE — ED Provider Notes (Signed)
Received sign out from [employer], PA-C at the beginning of shift.  Pt had a venopuncture to R femoral region, who subsequently develop pain and swelling. Initially CT showing hematoma but pseudoaneurysm was not excluded, it was recommended for an [place] to be performed for better assessment.    US demonstrates evidence of a hematoma, no evidence of pseudoaneurysm.  On exam, significant ecchymosis skin changes noted to the right groin extending towards the right thigh, right lower abdomen, and right labial region with induration noted to the right groin.  No pulsatile mass.  Patient noted to have low blood pressure of 88/56 however she mentioned her baseline blood pressures usually in the low 90s.  I suspect this is near her baseline.  Patient given instruction for care of ecchymosis which includes cool compress and rest.  She will follow-up closely with her PCP for further care.  Return precautions discussed.  BP 92/68 (BP Location: Left Arm)   Pulse 64   Temp 97.9 F (36.6 C) (Oral)   Resp 18   Ht _0  (1.6 m)   Wt 59.4 kg (131 lb)   SpO2 98%   BMI 23.21 kg/m   Results for orders placed or performed during the hospital encounter of 11/29/17  Comprehensive metabolic panel  Result Value Ref Range   Sodium 140 135 - 145 mmol/L   Potassium 4.3 3.5 - 5.1 mmol/L   Chloride 104 101 - 111 mmol/L   CO2 29 22 - 32 mmol/L   Glucose, Bld 79 65 - 99 mg/dL   BUN 14 6 - 20 mg/dL   Creatinine, Ser 0.67 0.44 - 1.00 mg/dL   Calcium 9.1 8.9 - 10.3 mg/dL   Total Protein 5.8 (L) 6.5 - 8.1 g/dL   Albumin 3.2 (L) 3.5 - 5.0 g/dL   AST 17 15 - 41 U/L   ALT 14 14 - 54 U/L   Alkaline Phosphatase 37 (L) 38 - 126 U/L   Total Bilirubin 0.4 0.3 - 1.2 mg/dL   GFR calc non Af Amer >60 >60 mL/min   GFR calc Af Amer >60 >60 mL/min   Anion gap 7 5 - 15  CBC with Differential  Result Value Ref Range   WBC 11.8 (H) 4.0 - 10.5 K/uL   RBC 2.94 (L) 3.87 - 5.11 MIL/uL   Hemoglobin 9.6 (L) 12.0 - 15.0 g/dL   HCT 31.7  (L) 36.0 - 46.0 %   MCV 107.8 (H) 78.0 - 100.0 fL   MCH 32.7 26.0 - 34.0 pg   MCHC 30.3 30.0 - 36.0 g/dL   RDW 13.8 11.5 - 15.5 %   Platelets 243 150 - 400 K/uL   Neutrophils Relative % 57 %   Neutro Abs 6.8 1.7 - 7.7 K/uL   Lymphocytes Relative 27 %   Lymphs Abs 3.2 0.7 - 4.0 K/uL   Monocytes Relative 11 %   Monocytes Absolute 1.2 (H) 0.1 - 1.0 K/uL   Eosinophils Relative 2 %   Eosinophils Absolute 0.2 0.0 - 0.7 K/uL   Basophils Relative 1 %   Basophils Absolute 0.1 0.0 - 0.1 K/uL   Immature Granulocytes 2 %   Abs Immature Granulocytes 0.3 (H) 0.0 - 0.1 K/uL  Protime-INR  Result Value Ref Range   Prothrombin Time 11.9 11.4 - 15.2 seconds   INR 0.88    Ir Transcath/emboliz  Result Date: 11/28/2017 INDICATION: Patient with a recent history of worsening headaches over the past few months involving the right parietooccipital regions.  Past history of migraine headaches. 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 CLINICAL DATA:  Discovery of recent intracranial aneurysms as above. EXAM: TRANSCATHETER THERAPY EMBOLIZATION; IR NEURO EACH ADD'L AFTER BASIC UNI RIGHT (MS); IR ANGIO INTRA EXTRACRAN SEL COM CAROTID INNOMINATE UNI LEFT MOD SED; ARTERIOGRAPHY; IR ANGIO VERTEBRAL SEL VERTEBRAL UNI LEFT MOD SED; IR ANGIO INTRA EXTRACRAN SEL INTERNAL CAROTID UNI RIGHT MOD SED COMPARISON:  Recent MRA MRI of the brain. MEDICATIONS: Heparin 4,500 units IV; none antibiotic was administered within 1 hour of the procedure. ANESTHESIA/SEDATION: Mac anesthesia for the initial diagnostic portion and general anesthesia for the intervention portion. CONTRAST:  Isovue 300 approximately 140 mL. FLUOROSCOPY TIME:  Fluoroscopy Time: 38 minutes 36 seconds (1461 mGy). COMPLICATIONS: None immediate. TECHNIQUE: Informed written consent was obtained from the patient after a thorough discussion of the procedural risks, benefits and  alternatives. All questions were addressed. Maximal Sterile Barrier Technique was utilized including caps, mask, sterile gowns, sterile gloves, sterile drape, hand hygiene and skin antiseptic. A timeout was performed prior to the initiation of the procedure. The right groin was prepped and draped in the usual sterile fashion. Thereafter using modified Seldinger technique, transfemoral access into the right common femoral artery was obtained without difficulty. Over a 0.035 inch guidewire, a 5 French Pinnacle sheath was inserted. Through this, and also over 0.035 inch guidewire, a 5 [place] JB 1 catheter was advanced to the aortic arch region and selectively positioned in the right common carotid artery, right subclavian artery, the left common carotid artery and the left vertebral artery. A 3D rotational arteriogram was also performed of the right anterior circulation intracranially from a right common carotid artery injection. 3D reconstructions were then performed on a separate workstation. FINDINGS: The right subclavian arteriogram demonstrates the right vertebral artery origin to be widely patent. This is the hypoplastic right vertebral artery which ascends to the cranial skull base. Faint opacification of the right posterior-inferior cerebellar artery and the right vertebrobasilar junction proximally is seen. The right common carotid arteriogram demonstrates the right external carotid artery and its major branches to be widely patent. The right internal carotid artery at the bulb to the cranial skull base opacifies normally. Patency is seen of the petrous, cavernous and supraclinoid segments. A right posterior communicating artery is seen opacifying the right posterior cerebral artery distribution. The right middle cerebral artery and transiently the right anterior cerebral artery opacify into the capillary and venous phases. Arising in the right internal carotid artery paraophthalmic region just distal to the  origin of the right ophthalmic artery is a slight bilobed saccular aneurysm projecting superiorly. Similarly there is an aneurysm arising from the inferior division of the right middle cerebral artery proximally. 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 Also confirmed by the 3D rotational arteriogram is an approximately 2.9 mm x 2.7 mm saccular outpouching from the proximal inferior division of the right middle cerebral artery. This, however, appears to have a vessel arising from the proximal fundus of the aneurysm. The left common carotid arteriogram demonstrates the left external carotid artery and its major branches to be widely patent. The left internal carotid artery proximally to the cranial skull base opacifies normally. The petrous, cavernous and supraclinoid segments demonstrate wide patency. The left middle cerebral artery and the left anterior cerebral artery opacify into the capillary and venous phases. There is  prompt cross filling via the anterior communicating artery of the right anterior cerebral artery A2 segment into the capillary and venous phases. The venous phase also demonstrates a focal area of severe attenuation of the caliber of the right transverse sinus. The dominant left vertebral artery origin is widely patent. The vessel is seen to opacify normally to the cranial skull base. Wide patency is seen of the left vertebrobasilar junction and the left posterior-inferior cerebellar artery. The basilar artery, the left posterior cerebral artery, the superior cerebellar arteries and the anterior-inferior cerebellar arteries opacify into the capillary and venous phases. 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 The angiographic findings were reviewed with the patient and the patient's family.  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 Again the endovascular option of treatment of the right paraophthalmic region aneurysm with a pipeline flow diverter device with periprocedural risk of thromboembolic stroke of approximately 1%, and the remote possibility of a delayed hemorrhage intracranially were all reviewed. 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 The patient was put under general anesthesia by the Department of Anesthesiology at [facility]. The 5 [place] 1 catheter in the right common carotid artery was then exchanged over a 0.035 inch 300 cm Constance Holster exchange guidewire for a 6 French 80 cm Cook shuttle sheath using biplane roadmap technique and constant fluoroscopic guidance. Good aspiration obtained from the hub of the [facility] shuttle sheath. A gentle contrast injection demonstrated no evidence of spasms, dissections or of intraluminal filling defects. Over a 0.035 inch Roadrunner guidewire, using biplane roadmap technique and constant fluoroscopic guidance, a Navien 5 French 115 cm guide catheter was then advanced to the petrous cavernous segment of the right internal carotid artery. The guidewire was removed. Good aspiration obtained from the hub of the Navien guide catheter. A control arteriogram performed centered intracranially demonstrated no change in the intracranial or extracranial circulations. At this time, the [facility] shuttle sheath was advanced to the distal cervical right ICA. Over a 0.014 inch Softip Synchro micro guidewire, a Phenom 027  microcatheter was advanced to the distal 5 [place] Navien guide catheter. With the micro guidewire leading with a J-tip configuration, the combination was navigated without difficulty to the supraclinoid right ICA. Using a torque device, the micro guidewire was advanced to the M2 M3 region of the inferior division of the right middle cerebral artery followed by the microcatheter. The guidewire was removed. Good aspiration obtained from the hub of the microcatheter. A gentle contrast injection demonstrates safe positioning of tip of the microcatheter. This was then connected to continuous heparinized saline infusion. Measurements had been performed of the right internal carotid artery, just distal and proximal to the aneurysm. It was decided to use a 125 x 16 mm pipeline flex flow diverter device. This was then advanced in a coaxial manner and with constant heparinized saline infusion using biplane roadmap technique and constant fluoroscopic guidance to the distal end of the microcatheter. The O ring on the delivery microcatheter was then loosened. The entire system was then straightened. With slight forward gentle traction with the right hand on  the delivery micro guidewire, with the left hand the delivery microcatheter was retrieved unsheathing the distal wire on the device, and the distal portion of the device itself. This was continued until there was a cigar configuration of the device distally. The system was then slightly retrieved more proximally with gentle to and fro motion of the microcatheter. This resulted in the complete opening of the distal device. The combination was then retrieved proximally to the landing zone distally of the device which was just proximal to the origin of the right posterior communicating artery. Once there, the micro guidewire was then advanced to deploy and deliver the device. During this time fluffing of the device was performed with the advancement and to and fro motion of the  microcatheter. This resulted in good apposition with the native vessel. Also the tip of the microcatheter was maintained centralized within the native vessel. Once the entire device had been deployed, a control arteriogram performed through the 5 [place] Navien guide catheter in the petrous segment of the right internal carotid artery demonstrated excellent apposition with stasis within the aneurysm itself. Intracranially no evidence of intracranial filling defects were seen. The micro catheter was then advanced through the delivery device into the right MCA and the wire was captured into the microcatheter. The combination was then gently retrieved and removed ensuring no entanglement of the device. None was observed. Control arteriograms were then performed at 15,30 and 40 minutes post the deployment of the device. No intracranial filling defects or intra device irregularities were seen. The patient's ACT was maintained in the region of 200-210 seconds. Hemodynamically and neurologically the patient remained stable. No evidence of extravasation was noted either. The 5 [place] Navien guide catheter and the 6 [place] Cook shuttle sheath were then retrieved into the abdominal aorta and exchanged over a J-tip guidewire for a 6 Pinnacle sheath. This in turn was then removed with the successful application of an Angio-Seal closure device. The right groin site appeared soft without evidence of a hematoma or bleeding. Distal pulses remained palpable in the dorsalis pedis, and posterior regions bilaterally at the end of the procedure unchanged from prior to the procedure. The patient's general anesthesia was then reversed and the patient was extubated without difficulty. Upon recovery the patient demonstrated no new neurological signs or symptoms. She denied any headaches. She did have mild nausea which responded to the treatment. The patient was then transferred to the neuro PACU and then to neuro ICU to continue on IV  heparin overnight, and close monitoring of the patient's blood pressure. The patient was able to handle a clear liquid diet overnight. The following morning the IV heparin was stopped. Patient was switched to aspirin 81 mg a day, and Brilinta 90 mg b.i.d. The patient's P2Y12 was in the region of approximately 70. Apparently she continued to have a mild ooze from her groin tract side. She did not have any underlying hematoma, and pressure was applied again with the patient maintained in a supine position for 4 more hours which resulted in the gradual stopping of the ooze. The distal pulses remained palpable bilaterally. The patient during the nighttime also had complained of left-sided chest pains. An EKG performed revealed no evidence of acute myocardial ischemia. A cardiology consult was obtained with recommendations to be followed. In view of the patient's slight ooze in the right groin puncture site, her discharge was delayed for another 12-24 hours in order to ensure complete hemostasis of the right puncture site. Towards the  afternoon, the patient was able to maintain full p.o. intake. 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 PLAN: Following discharge, the patient will be seen in the Outpatient in approximately 2 weeks time. During this time, the patient will be encouraged to maintain adequate hydration, and to continue her medications especially the dual antiplatelets. Patient would also be instructed to refrain from stooping, bending or lifting weights above 10 pounds. She apparently has support at home during this time. INDICATION: Patient with a recent history of worsening headaches over the past few months involving the right parietooccipital regions. Past history of migraine headaches. 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 Electronically Signed   By: Luanne Bras M.D.   On: 11/24/2017 17:32   Ir Angiogram Follow Up Study  Result Date: 11/28/2017 INDICATION: Patient with a recent history of worsening headaches over the past few months involving the right parietooccipital regions. Past history of migraine headaches. 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 CLINICAL DATA:  Discovery of recent intracranial aneurysms as above. EXAM: TRANSCATHETER THERAPY EMBOLIZATION; IR NEURO EACH ADD'L AFTER BASIC UNI RIGHT (MS); IR ANGIO INTRA EXTRACRAN SEL COM CAROTID INNOMINATE UNI LEFT MOD SED; ARTERIOGRAPHY; IR ANGIO VERTEBRAL SEL VERTEBRAL UNI LEFT MOD SED; IR ANGIO INTRA EXTRACRAN SEL INTERNAL CAROTID UNI RIGHT MOD SED COMPARISON:  Recent MRA MRI of the brain. MEDICATIONS: Heparin 4,500 units IV; none antibiotic was administered within 1 hour of the procedure. ANESTHESIA/SEDATION: Mac anesthesia for the initial diagnostic portion and general anesthesia for the intervention portion. CONTRAST:  Isovue 300 approximately 140 mL. FLUOROSCOPY TIME:  Fluoroscopy Time: 38 minutes 36 seconds (1461 mGy). COMPLICATIONS: None immediate. TECHNIQUE: Informed written consent was obtained from the patient after a thorough discussion of the procedural risks, benefits and alternatives. All questions were addressed. Maximal Sterile Barrier Technique was utilized including caps, mask, sterile gowns, sterile gloves, sterile drape, hand hygiene and skin antiseptic. A timeout was performed prior to the initiation of the procedure. The right groin was prepped and draped in the usual sterile fashion. Thereafter using modified Seldinger technique, transfemoral access into the right common femoral artery was obtained without difficulty. Over a 0.035 inch guidewire, a 5 French Pinnacle sheath was inserted. Through this, and  also over 0.035 inch guidewire, a 5 [place] JB 1 catheter was advanced to the aortic arch region and selectively positioned in the right common carotid artery, right subclavian artery, the left common carotid artery and the left vertebral artery. A 3D rotational arteriogram was also performed of the right anterior circulation intracranially from a right common carotid artery injection. 3D reconstructions were then performed on a separate workstation. FINDINGS: The right subclavian arteriogram demonstrates the right vertebral artery origin to be widely patent. This is the hypoplastic right vertebral artery which ascends to the cranial skull base. Faint opacification of the right posterior-inferior cerebellar artery and the right vertebrobasilar junction proximally is seen. The right common carotid arteriogram demonstrates the right external carotid artery and its major branches to be widely patent. The right internal carotid artery at the bulb to the cranial skull base opacifies normally. Patency is seen of the petrous, cavernous and supraclinoid segments. A right posterior communicating artery is seen opacifying the right posterior cerebral artery distribution. The right middle cerebral artery and transiently the right  anterior cerebral artery opacify into the capillary and venous phases. Arising in the right internal carotid artery paraophthalmic region just distal to the origin of the right ophthalmic artery is a slight bilobed saccular aneurysm projecting superiorly. Similarly there is an aneurysm arising from the inferior division of the right middle cerebral artery proximally. 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 Also confirmed by the 3D rotational arteriogram is an approximately 2.9 mm x 2.7 mm saccular outpouching from the proximal inferior division of the right middle cerebral artery. This,  however, appears to have a vessel arising from the proximal fundus of the aneurysm. The left common carotid arteriogram demonstrates the left external carotid artery and its major branches to be widely patent. The left internal carotid artery proximally to the cranial skull base opacifies normally. The petrous, cavernous and supraclinoid segments demonstrate wide patency. The left middle cerebral artery and the left anterior cerebral artery opacify into the capillary and venous phases. There is prompt cross filling via the anterior communicating artery of the right anterior cerebral artery A2 segment into the capillary and venous phases. The venous phase also demonstrates a focal area of severe attenuation of the caliber of the right transverse sinus. The dominant left vertebral artery origin is widely patent. The vessel is seen to opacify normally to the cranial skull base. Wide patency is seen of the left vertebrobasilar junction and the left posterior-inferior cerebellar artery. The basilar artery, the left posterior cerebral artery, the superior cerebellar arteries and the anterior-inferior cerebellar arteries opacify into the capillary and venous phases. 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 The angiographic findings were reviewed with the patient and the patient's family. 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 Again the endovascular option of treatment of the right paraophthalmic region aneurysm with a pipeline flow diverter device with periprocedural risk of thromboembolic stroke of approximately  1%, and the remote possibility of a delayed hemorrhage intracranially were all reviewed. 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 The patient was put under general anesthesia by the Department of Anesthesiology at [facility]. The 5 [place] 1 catheter in the right common carotid artery was then exchanged over a 0.035 inch 300 cm Constance Holster exchange guidewire for a 6 [place] 80 cm Cook shuttle sheath using biplane roadmap technique and constant fluoroscopic guidance. Good aspiration obtained from the hub of the [facility] shuttle sheath. A gentle contrast injection demonstrated no evidence of spasms, dissections or of intraluminal filling defects. Over a 0.035 inch Roadrunner guidewire, using biplane roadmap technique and constant fluoroscopic guidance, a Navien 5 [place] 115 cm guide catheter was then advanced to the petrous cavernous segment of the right internal carotid artery. The guidewire was removed. Good aspiration obtained from the hub of the Navien guide catheter. A control arteriogram performed centered intracranially demonstrated no change in the intracranial or extracranial circulations. At this time, the [facility] shuttle sheath was advanced to the distal cervical right ICA. Over a 0.014 inch Softip Synchro micro guidewire, a Phenom  027 microcatheter was advanced to the distal 5 [place] Navien guide catheter. With the micro guidewire leading with a J-tip configuration, the combination was navigated without difficulty to the supraclinoid right ICA. Using a torque device, the micro guidewire was advanced to the M2 M3 region of the inferior division of the right middle cerebral artery followed by the microcatheter. The guidewire was removed. Good aspiration obtained from the hub of the microcatheter. A gentle contrast injection demonstrates safe positioning of tip of the microcatheter. This  was then connected to continuous heparinized saline infusion. Measurements had been performed of the right internal carotid artery, just distal and proximal to the aneurysm. It was decided to use a 125 x 16 mm pipeline flex flow diverter device. This was then advanced in a coaxial manner and with constant heparinized saline infusion using biplane roadmap technique and constant fluoroscopic guidance to the distal end of the microcatheter. The O ring on the delivery microcatheter was then loosened. The entire system was then straightened. With slight forward gentle traction with the right hand on the delivery micro guidewire, with the left hand the delivery microcatheter was retrieved unsheathing the distal wire on the device, and the distal portion of the device itself. This was continued until there was a cigar configuration of the device distally. The system was then slightly retrieved more proximally with gentle to and fro motion of the microcatheter. This resulted in the complete opening of the distal device. The combination was then retrieved proximally to the landing zone distally of the device which was just proximal to the origin of the right posterior communicating artery. Once there, the micro guidewire was then advanced to deploy and deliver the device. During this time fluffing of the device was performed with the advancement and to and fro motion of the microcatheter. This resulted in good apposition with the native vessel. Also the tip of the microcatheter was maintained centralized within the native vessel. Once the entire device had been deployed, a control arteriogram performed through the 5 [place] Navien guide catheter in the petrous segment of the right internal carotid artery demonstrated excellent apposition with stasis within the aneurysm itself. Intracranially no evidence of intracranial filling defects were seen. The micro catheter was then advanced through the delivery device into the right  MCA and the wire was captured into the microcatheter. The combination was then gently retrieved and removed ensuring no entanglement of the device. None was observed. Control arteriograms were then performed at 15,30 and 40 minutes post the deployment of the device. No intracranial filling defects or intra device irregularities were seen. The patient's ACT was maintained in the region of 200-210 seconds. Hemodynamically and neurologically the patient remained stable. No evidence of extravasation was noted either. The 5 [place] Navien guide catheter and the 6 [place] Cook shuttle sheath were then retrieved into the abdominal aorta and exchanged over a J-tip guidewire for a 6 Pinnacle sheath. This in turn was then removed with the successful application of an Angio-Seal closure device. The right groin site appeared soft without evidence of a hematoma or bleeding. Distal pulses remained palpable in the dorsalis pedis, and posterior regions bilaterally at the end of the procedure unchanged from prior to the procedure. The patient's general anesthesia was then reversed and the patient was extubated without difficulty. Upon recovery the patient demonstrated no new neurological signs or symptoms. She denied any headaches. She did have mild nausea which responded to the treatment. The patient was then transferred to the neuro  PACU and then to neuro ICU to continue on IV heparin overnight, and close monitoring of the patient's blood pressure. The patient was able to handle a clear liquid diet overnight. The following morning the IV heparin was stopped. Patient was switched to aspirin 81 mg a day, and Brilinta 90 mg b.i.d. The patient's P2Y12 was in the region of approximately 70. Apparently she continued to have a mild ooze from her groin tract side. She did not have any underlying hematoma, and pressure was applied again with the patient maintained in a supine position for 4 more hours which resulted in the gradual stopping  of the ooze. The distal pulses remained palpable bilaterally. The patient during the nighttime also had complained of left-sided chest pains. An EKG performed revealed no evidence of acute myocardial ischemia. A cardiology consult was obtained with recommendations to be followed. In view of the patient's slight ooze in the right groin puncture site, her discharge was delayed for another 12-24 hours in order to ensure complete hemostasis of the right puncture site. Towards the afternoon, the patient was able to maintain full p.o. intake. 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 PLAN: Following discharge, the patient will be seen in the Outpatient in approximately 2 weeks time. During this time, the patient will be encouraged to maintain adequate hydration, and to continue her medications especially the dual antiplatelets. Patient would also be instructed to refrain from stooping, bending or lifting weights above 10 pounds. She apparently has support at home during this time. INDICATION: Patient with a recent history of worsening headaches over the past few months involving the right parietooccipital regions. Past history of migraine headaches. 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 Electronically Signed   By: Luanne Bras M.D.   On: 11/24/2017 17:32   Ir 3d Coralyn Mark  Result Date: 11/29/2017 INDICATION: Patient with a recent history of worsening headaches over the past few months involving the right parietooccipital regions. Past history of migraine headaches. 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 CLINICAL DATA:  Discovery of recent intracranial aneurysms as above. EXAM:  TRANSCATHETER THERAPY EMBOLIZATION; IR NEURO EACH ADD'L AFTER BASIC UNI RIGHT (MS); IR ANGIO INTRA EXTRACRAN SEL COM CAROTID INNOMINATE UNI LEFT MOD SED; ARTERIOGRAPHY; IR ANGIO VERTEBRAL SEL VERTEBRAL UNI LEFT MOD SED; IR ANGIO INTRA EXTRACRAN SEL INTERNAL CAROTID UNI RIGHT MOD SED COMPARISON:  Recent MRA MRI of the brain. MEDICATIONS: Heparin 4,500 units IV; none antibiotic was administered within 1 hour of the procedure. ANESTHESIA/SEDATION: Mac anesthesia for the initial diagnostic portion and general anesthesia for the intervention portion. CONTRAST:  Isovue 300 approximately 140 mL. FLUOROSCOPY TIME:  Fluoroscopy Time: 38 minutes 36 seconds (1461 mGy). COMPLICATIONS: None immediate. TECHNIQUE: Informed written consent was obtained from the patient after a thorough discussion of the procedural risks, benefits and alternatives. All questions were addressed. Maximal Sterile Barrier Technique was utilized including caps, mask, sterile gowns, sterile gloves, sterile drape, hand hygiene and skin antiseptic. A timeout was performed prior to the initiation of the procedure. The right groin was prepped and draped in the usual sterile fashion. Thereafter using modified Seldinger technique, transfemoral access into the right common femoral artery was obtained without difficulty. Over a 0.035 inch guidewire, a 5 French Pinnacle sheath was inserted. Through this, and also  over 0.035 inch guidewire, a 5 [place] JB 1 catheter was advanced to the aortic arch region and selectively positioned in the right common carotid artery, right subclavian artery, the left common carotid artery and the left vertebral artery. A 3D rotational arteriogram was also performed of the right anterior circulation intracranially from a right common carotid artery injection. 3D reconstructions were then performed on a separate workstation. FINDINGS: The right subclavian arteriogram demonstrates the right vertebral artery origin to be widely patent.  This is the hypoplastic right vertebral artery which ascends to the cranial skull base. Faint opacification of the right posterior-inferior cerebellar artery and the right vertebrobasilar junction proximally is seen. The right common carotid arteriogram demonstrates the right external carotid artery and its major branches to be widely patent. The right internal carotid artery at the bulb to the cranial skull base opacifies normally. Patency is seen of the petrous, cavernous and supraclinoid segments. A right posterior communicating artery is seen opacifying the right posterior cerebral artery distribution. The right middle cerebral artery and transiently the right anterior cerebral artery opacify into the capillary and venous phases. Arising in the right internal carotid artery paraophthalmic region just distal to the origin of the right ophthalmic artery is a slight bilobed saccular aneurysm projecting superiorly. Similarly there is an aneurysm arising from the inferior division of the right middle cerebral artery proximally. 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 Also confirmed by the 3D rotational arteriogram is an approximately 2.9 mm x 2.7 mm saccular outpouching from the proximal inferior division of the right middle cerebral artery. This, however, appears to have a vessel arising from the proximal fundus of the aneurysm. The left common carotid arteriogram demonstrates the left external carotid artery and its major branches to be widely patent. The left internal carotid artery proximally to the cranial skull base opacifies normally. The petrous, cavernous and supraclinoid segments demonstrate wide patency. The left middle cerebral artery and the left anterior cerebral artery opacify into the capillary and venous phases. There is prompt cross filling via the anterior communicating artery of the  right anterior cerebral artery A2 segment into the capillary and venous phases. The venous phase also demonstrates a focal area of severe attenuation of the caliber of the right transverse sinus. The dominant left vertebral artery origin is widely patent. The vessel is seen to opacify normally to the cranial skull base. Wide patency is seen of the left vertebrobasilar junction and the left posterior-inferior cerebellar artery. The basilar artery, the left posterior cerebral artery, the superior cerebellar arteries and the anterior-inferior cerebellar arteries opacify into the capillary and venous phases. 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 The angiographic findings were reviewed with the patient and the patient's family. 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 Again the endovascular option of treatment of the right paraophthalmic region aneurysm with a pipeline flow diverter device with periprocedural risk of thromboembolic stroke of approximately 1%, and the remote possibility of a delayed hemorrhage intracranially were all reviewed. The patient again expressed her understanding and wanting to proceed with endovascular treatment of the right  internal carotid artery paraophthalmic region aneurysm. The patient was put under general anesthesia by the Department of Anesthesiology at [facility]. The 5 [place] 1 catheter in the right common carotid artery was then exchanged over a 0.035 inch 300 cm Constance Holster exchange guidewire for a 6 French 80 cm Cook shuttle sheath using biplane roadmap technique and  constant fluoroscopic guidance. Good aspiration obtained from the hub of the [facility] shuttle sheath. A gentle contrast injection demonstrated no evidence of spasms, dissections or of intraluminal filling defects. Over a 0.035 inch Roadrunner guidewire, using biplane roadmap technique and constant fluoroscopic guidance, a Navien 5 French 115 cm guide catheter was then advanced to the petrous cavernous segment of the right internal carotid artery. The guidewire was removed. Good aspiration obtained from the hub of the Navien guide catheter. A control arteriogram performed centered intracranially demonstrated no change in the intracranial or extracranial circulations. At this time, the [facility] shuttle sheath was advanced to the distal cervical right ICA. Over a 0.014 inch Softip Synchro micro guidewire, a Phenom 027 microcatheter was advanced to the distal 5 [place] Navien guide catheter. With the micro guidewire leading with a J-tip configuration, the combination was navigated without difficulty to the supraclinoid right ICA. Using a torque device, the micro guidewire was advanced to the M2 M3 region of the inferior division of the right middle cerebral artery followed by the microcatheter. The guidewire was removed. Good aspiration obtained from the hub of the microcatheter. A gentle contrast injection demonstrates safe positioning of tip of the microcatheter. This was then connected to continuous heparinized saline infusion. Measurements had been performed of the right internal carotid artery, just distal and proximal to the aneurysm. It was decided to use a 125 x 16 mm pipeline flex flow diverter device. This was then advanced in a coaxial manner and with constant heparinized saline infusion using biplane roadmap technique and constant fluoroscopic guidance to the distal end of the microcatheter. The O ring on the delivery microcatheter was then loosened. The entire system was then straightened. With slight forward gentle  traction with the right hand on the delivery micro guidewire, with the left hand the delivery microcatheter was retrieved unsheathing the distal wire on the device, and the distal portion of the device itself. This was continued until there was a cigar configuration of the device distally. The system was then slightly retrieved more proximally with gentle to and fro motion of the microcatheter. This resulted in the complete opening of the distal device. The combination was then retrieved proximally to the landing zone distally of the device which was just proximal to the origin of the right posterior communicating artery. Once there, the micro guidewire was then advanced to deploy and deliver the device. During this time fluffing of the device was performed with the advancement and to and fro motion of the microcatheter. This resulted in good apposition with the native vessel. Also the tip of the microcatheter was maintained centralized within the native vessel. Once the entire device had been deployed, a control arteriogram performed through the 5 [place] Navien guide catheter in the petrous segment of the right internal carotid artery demonstrated excellent apposition with stasis within the aneurysm itself. Intracranially no evidence of intracranial filling defects were seen. The micro catheter was then advanced through the delivery device into the right MCA and the wire was captured into the microcatheter. The combination was then gently retrieved and removed ensuring no entanglement of the device. None was observed. Control  arteriograms were then performed at 15,30 and 40 minutes post the deployment of the device. No intracranial filling defects or intra device irregularities were seen. The patient's ACT was maintained in the region of 200-210 seconds. Hemodynamically and neurologically the patient remained stable. No evidence of extravasation was noted either. The 5 [place] Navien guide catheter and the 6 [place]  Cook shuttle sheath were then retrieved into the abdominal aorta and exchanged over a J-tip guidewire for a 6 Pinnacle sheath. This in turn was then removed with the successful application of an Angio-Seal closure device. The right groin site appeared soft without evidence of a hematoma or bleeding. Distal pulses remained palpable in the dorsalis pedis, and posterior regions bilaterally at the end of the procedure unchanged from prior to the procedure. The patient's general anesthesia was then reversed and the patient was extubated without difficulty. Upon recovery the patient demonstrated no new neurological signs or symptoms. She denied any headaches. She did have mild nausea which responded to the treatment. The patient was then transferred to the neuro PACU and then to neuro ICU to continue on IV heparin overnight, and close monitoring of the patient's blood pressure. The patient was able to handle a clear liquid diet overnight. The following morning the IV heparin was stopped. Patient was switched to aspirin 81 mg a day, and Brilinta 90 mg b.i.d. The patient's P2Y12 was in the region of approximately 70. Apparently she continued to have a mild ooze from her groin tract side. She did not have any underlying hematoma, and pressure was applied again with the patient maintained in a supine position for 4 more hours which resulted in the gradual stopping of the ooze. The distal pulses remained palpable bilaterally. The patient during the nighttime also had complained of left-sided chest pains. An EKG performed revealed no evidence of acute myocardial ischemia. A cardiology consult was obtained with recommendations to be followed. In view of the patient's slight ooze in the right groin puncture site, her discharge was delayed for another 12-24 hours in order to ensure complete hemostasis of the right puncture site. Towards the afternoon, the patient was able to maintain full p.o. intake. IMPRESSION: Status post  endovascular treatment of a right internal carotid artery intracranial paraophthalmic region using the pipeline flex flow diverter device as described above. PLAN: Following discharge, the patient will be seen in the Outpatient in approximately 2 weeks time. During this time, the patient will be encouraged to maintain adequate hydration, and to continue her medications especially the dual antiplatelets. Patient would also be instructed to refrain from stooping, bending or lifting weights above 10 pounds. She apparently has support at home during this time. INDICATION: Patient with a recent history of worsening headaches over the past few months involving the right parietooccipital regions. Past history of migraine headaches. 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 Electronically Signed   By: Luanne Bras M.D.   On: 11/24/2017 17:32   Ct Extremity Lower Right W Contrast  Result Date: 11/29/2017 CLINICAL DATA:  57 year old female with history notable for aneurysm repair via femoral puncture last week. Patient notes swelling and discoloration de for right thigh and right labia majora. EXAM: CT OF THE LOWER RIGHT EXTREMITY WITH CONTRAST TECHNIQUE: Multidetector CT imaging of the lower right extremity was performed according to the standard protocol following intravenous contrast administration. COMPARISON:  None. CONTRAST:  156m OMNIPAQUE IOHEXOL 300 MG/ML  SOLN FINDINGS: Bones/Joint/Cartilage Mild disc  space narrowing at L4-5 with grade 1 anterolisthesis of L4 on L5 secondary to degenerative facet arthropathy of the included lower lumbar spine. Facet arthropathy is seen from L4 through S1. No acute fracture of the lumbar spine, sacrum or coccyx. The right iliac bone is intact.  The pubic rami are unremarkable. The included right hip joint, femoral head, femur, tibia and fibula are nonacute. The knee joint is  maintained without joint effusion. The tibiotalar and subtalar joints, midfoot articulations and forefoot are unremarkable. No suspicious osseous lesions. No focal chondral defect. No joint effusion. Ligaments Suboptimally assessed by CT. Muscles and Tendons No intramuscular hemorrhage, mass or atrophy. The extensor mechanism tendons about the knee are unremarkable. The tendons crossing the ankle joint are maintained without evidence of rupture, tenosynovitis or dislocation. Soft tissues Small subcutaneous slightly hyperdense fluid collection is seen overlying the right common femoral artery and vein measuring 3.5 x 1.6 x 3.5 cm. Findings likely represent postprocedural hematoma. Underlying soft tissue induration is seen without definite evidence of active hemorrhage. This could be further correlated with ultrasound to exclude possibility of a pseudoaneurysm. Tracking soft tissue edema and induration is identified along the anterior and medial aspect of the thigh with soft tissue edema about the knee and proximal calf as well. There is normal three-vessel runoff to the foot. IMPRESSION: 1. Amorphous soft tissue attenuation at prior femoral puncture site suspicious for postprocedural hematoma measuring 3.5 x 1.6 x 3.5 cm. No definite evidence of active hemorrhage. The possibility of a pseudoaneurysm however is not entirely excluded. Suggest ultrasound for better assessment. 2. Soft tissue induration likely represents tracking subcutaneous fluid and edema along the right thigh, knee and proximal calf. 3. No acute osseous abnormality is visualized. Electronically Signed   By: Ashley Royalty M.D.   On: 11/29/2017 23:59   Ir Radi[facility] Eval [facility]  Result Date: 11/10/2017 EXAM: NEW PATIENT OFFICE VISIT CHIEF COMPLAINT: Headaches.  Recent discovery of a right-sided intracranial aneurysm. 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 The patient does give a history of migraine headaches which appear to be generalized for many years. She reports there is a constant background headache in the parietooccipital regions. Acute exacerbations may last up to 4 days with associated nausea, vomiting and significant debilitation where the patient is nonfunctional. During this time she reports only mild improvement with the Depakote and other medications she takes. Following the headaches, the patient appears spent out and tired. Her most recent episode was about 5 days ago over [employer] [place] lasting 4 days again associated with nausea, vomiting, photophobia and significant debilitation. According to the patient she was "unresponsive" for 3 days. Her migraine headaches have been reasonably controlled without undue debilitation up until November of last year. Otherwise, the patient reports no changes in her speech, visual blurring, diplopia, blindness, or loss of consciousness or of seizure-like activity. The patient has had multiple visits with a neurologist and also her internist regarding management of her headaches. 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 Review of systems, otherwise, the patient reports no undue chest pain, shortness of breath or palpitation, coughing, wheezing or of hemoptysis. Her weight is steady, her appetite is within normal limits. She has no abdominal pain, constipation, diarrhea or bloody stools. She denies  symptoms of dysuria, hematuria or polyuria. Past Medical History: Past medical history of fibromyalgia, and measured depression. Surgical History: Hysterectomy, and oophorectomy and hemorrhoidectomy. Medications: Depakote. Ativan as needed. Gabapentin. Oxycodone which she has been taking for many years every 8  hours only when needed. Vitamin D tablets. Vitamin E tablets. Calcium supplementation. Apple vinegar. B12 tablets. Omeprazole. Patient claims to be taking medication for diuresis, with the other combination ingredient being unknown. Allergies: Bee and wasp stings, sulfa, penicillin, codeine. Social History: Patient has 4 children alive and well. 4 brothers and 3 sisters. She smokes up to a pack every 3-4 days. Denies using illicit chemicals. Says she drinks occasionally. Family History: Asthma. History of headaches. High blood pressure. Kidney disease. Lung disease. Stroke. Thyroid disease. Migraine headaches. Her grandfather died of a ruptured intracranial aneurysm. REVIEW OF SYSTEMS: Negative unless as mentioned above. PHYSICAL EXAMINATION: Somewhat anxious and jittery. Otherwise, affect appropriate for the situation. Neurologically intact without lateralizing cranial nerve, motor, sensory, coordination or station and gait abnormalities. ASSESSMENT AND PLAN: The patient's most recent MRI scan of the brain and MRA of the brain was reviewed with her. Brought to her attention was the saccular outpouching in the region of the right paraophthalmic region measuring approximately 3-3.5 mm. She was informed that is probably represented an intracranial aneurysm. The natural history of unruptured intracranial aneurysms was reviewed with her. Risk of rupture of 1-2% per year with attendant significant mobility and mortality were reviewed. Increased risk of rupture associated with female gender, hypertension, smoking and family history of ruptured or unruptured intracranial aneurysms was discussed. Options considered regarding future management was that of continued surveillance every 6 months to a year with neuroimaging with the patient advised to stop smoking and to continue monitoring her blood pressure. The other option was to consider obliteration of the aneurysm from the parent circulation. The endovascular route and  surgical clipping was reviewed with her. The patient exhibited some knowledge of treatment of unruptured intracranial aneurysms. The patient expressed a desire to have this treated endovascularly. The endovascular procedure of treatment would be proceeded by a diagnostic catheter arteriogram. Depending on the findings and the angio architecture of the aneurysm, options of endovascular treatment may include primary coiling, stent assisted coiling versus use of a flow diverter device. Risk of thromboembolic stroke, versus remote possibility of intracranial rupture either during treatment, versus delayed rupture with use of a flow diverter were reviewed in detail. The patient would be preprocedurally prepared with aspirin 81 mg a day, and Plavix 75 mg a day at least 10 days prior to the procedure. Postprocedure the patient would remain in the hospital for at least 24 hours with probable discharge should the patient remain stable without complications. Questions were answered to her satisfaction. The patient would like to proceed with a diagnostic catheter arteriogram with intent to treat. This will be scheduled as soon as possible. In the meantime, she was advised to stop smoking. She was asked to call should she have any concerns or questions. Electronically Signed   By: Luanne Bras M.D.   On: 11/09/2017 15:45   Ir Angio Intra Extracran Sel Com Carotid Innominate Uni L Mod Sed  Result Date: 11/28/2017 INDICATION: Patient with a recent history of worsening headaches over the past few months involving the right parietooccipital regions. Past history of migraine headaches. 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 CLINICAL DATA:  Discovery of recent intracranial aneurysms as above. EXAM:  TRANSCATHETER THERAPY EMBOLIZATION; IR NEURO EACH ADD'L AFTER BASIC UNI RIGHT (MS); IR ANGIO INTRA EXTRACRAN SEL COM CAROTID INNOMINATE  UNI LEFT MOD SED; ARTERIOGRAPHY; IR ANGIO VERTEBRAL SEL VERTEBRAL UNI LEFT MOD SED; IR ANGIO INTRA EXTRACRAN SEL INTERNAL CAROTID UNI RIGHT MOD SED COMPARISON:  Recent MRA MRI of the brain. MEDICATIONS: Heparin 4,500 units IV; none antibiotic was administered within 1 hour of the procedure. ANESTHESIA/SEDATION: Mac anesthesia for the initial diagnostic portion and general anesthesia for the intervention portion. CONTRAST:  Isovue 300 approximately 140 mL. FLUOROSCOPY TIME:  Fluoroscopy Time: 38 minutes 36 seconds (1461 mGy). COMPLICATIONS: None immediate. TECHNIQUE: Informed written consent was obtained from the patient after a thorough discussion of the procedural risks, benefits and alternatives. All questions were addressed. Maximal Sterile Barrier Technique was utilized including caps, mask, sterile gowns, sterile gloves, sterile drape, hand hygiene and skin antiseptic. A timeout was performed prior to the initiation of the procedure. The right groin was prepped and draped in the usual sterile fashion. Thereafter using modified Seldinger technique, transfemoral access into the right common femoral artery was obtained without difficulty. Over a 0.035 inch guidewire, a 5 French Pinnacle sheath was inserted. Through this, and also over 0.035 inch guidewire, a 5 [place] JB 1 catheter was advanced to the aortic arch region and selectively positioned in the right common carotid artery, right subclavian artery, the left common carotid artery and the left vertebral artery. A 3D rotational arteriogram was also performed of the right anterior circulation intracranially from a right common carotid artery injection. 3D reconstructions were then performed on a separate workstation. FINDINGS: The right subclavian arteriogram demonstrates the right vertebral artery origin to be widely patent. This is the hypoplastic right vertebral artery which ascends to the cranial skull base. Faint opacification of the right  posterior-inferior cerebellar artery and the right vertebrobasilar junction proximally is seen. The right common carotid arteriogram demonstrates the right external carotid artery and its major branches to be widely patent. The right internal carotid artery at the bulb to the cranial skull base opacifies normally. Patency is seen of the petrous, cavernous and supraclinoid segments. A right posterior communicating artery is seen opacifying the right posterior cerebral artery distribution. The right middle cerebral artery and transiently the right anterior cerebral artery opacify into the capillary and venous phases. Arising in the right internal carotid artery paraophthalmic region just distal to the origin of the right ophthalmic artery is a slight bilobed saccular aneurysm projecting superiorly. Similarly there is an aneurysm arising from the inferior division of the right middle cerebral artery proximally. 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 Also confirmed by the 3D rotational arteriogram is an approximately 2.9 mm x 2.7 mm saccular outpouching from the proximal inferior division of the right middle cerebral artery. This, however, appears to have a vessel arising from the proximal fundus of the aneurysm. The left common carotid arteriogram demonstrates the left external carotid artery and its major branches to be widely patent. The left internal carotid artery proximally to the cranial skull base opacifies normally. The petrous, cavernous and supraclinoid segments demonstrate wide patency. The left middle cerebral artery and the left anterior cerebral artery opacify into the capillary and venous phases. There is prompt cross filling via the anterior communicating artery of the right anterior cerebral artery A2 segment into the capillary and venous phases. The venous phase also demonstrates a focal  area of severe  attenuation of the caliber of the right transverse sinus. The dominant left vertebral artery origin is widely patent. The vessel is seen to opacify normally to the cranial skull base. Wide patency is seen of the left vertebrobasilar junction and the left posterior-inferior cerebellar artery. The basilar artery, the left posterior cerebral artery, the superior cerebellar arteries and the anterior-inferior cerebellar arteries opacify into the capillary and venous phases. 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 The angiographic findings were reviewed with the patient and the patient's family. 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 Again the endovascular option of treatment of the right paraophthalmic region aneurysm with a pipeline flow diverter device with periprocedural risk of thromboembolic stroke of approximately 1%, and the remote possibility of a delayed hemorrhage intracranially were all reviewed. 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 The patient was put under general anesthesia by the Department of Anesthesiology at [facility]. The 5 [place] 1 catheter in the right common carotid artery was then exchanged over a 0.035 inch 300 cm Constance Holster exchange guidewire for a 6 French 80 cm Cook shuttle sheath using biplane roadmap technique and constant fluoroscopic guidance. Good aspiration obtained from the hub of the [facility] shuttle sheath. A gentle contrast  injection demonstrated no evidence of spasms, dissections or of intraluminal filling defects. Over a 0.035 inch Roadrunner guidewire, using biplane roadmap technique and constant fluoroscopic guidance, a Navien 5 French 115 cm guide catheter was then advanced to the petrous cavernous segment of the right internal carotid artery. The guidewire was removed. Good aspiration obtained from the hub of the Navien guide catheter. A control arteriogram performed centered intracranially demonstrated no change in the intracranial or extracranial circulations. At this time, the [facility] shuttle sheath was advanced to the distal cervical right ICA. Over a 0.014 inch Softip Synchro micro guidewire, a Phenom 027 microcatheter was advanced to the distal 5 [place] Navien guide catheter. With the micro guidewire leading with a J-tip configuration, the combination was navigated without difficulty to the supraclinoid right ICA. Using a torque device, the micro guidewire was advanced to the M2 M3 region of the inferior division of the right middle cerebral artery followed by the microcatheter. The guidewire was removed. Good aspiration obtained from the hub of the microcatheter. A gentle contrast injection demonstrates safe positioning of tip of the microcatheter. This was then connected to continuous heparinized saline infusion. Measurements had been performed of the right internal carotid artery, just distal and proximal to the aneurysm. It was decided to use a 125 x 16 mm pipeline flex flow diverter device. This was then advanced in a coaxial manner and with constant heparinized saline infusion using biplane roadmap technique and constant fluoroscopic guidance to the distal end of the microcatheter. The O ring on the delivery microcatheter was then loosened. The entire system was then straightened. With slight forward gentle traction with the right hand on the delivery micro guidewire, with the left hand the delivery microcatheter was  retrieved unsheathing the distal wire on the device, and the distal portion of the device itself. This was continued until  there was a cigar configuration of the device distally. The system was then slightly retrieved more proximally with gentle to and fro motion of the microcatheter. This resulted in the complete opening of the distal device. The combination was then retrieved proximally to the landing zone distally of the device which was just proximal to the origin of the right posterior communicating artery. Once there, the micro guidewire was then advanced to deploy and deliver the device. During this time fluffing of the device was performed with the advancement and to and fro motion of the microcatheter. This resulted in good apposition with the native vessel. Also the tip of the microcatheter was maintained centralized within the native vessel. Once the entire device had been deployed, a control arteriogram performed through the 5 [place] Navien guide catheter in the petrous segment of the right internal carotid artery demonstrated excellent apposition with stasis within the aneurysm itself. Intracranially no evidence of intracranial filling defects were seen. The micro catheter was then advanced through the delivery device into the right MCA and the wire was captured into the microcatheter. The combination was then gently retrieved and removed ensuring no entanglement of the device. None was observed. Control arteriograms were then performed at 15,30 and 40 minutes post the deployment of the device. No intracranial filling defects or intra device irregularities were seen. The patient's ACT was maintained in the region of 200-210 seconds. Hemodynamically and neurologically the patient remained stable. No evidence of extravasation was noted either. The 5 [place] Navien guide catheter and the 6 [place] Cook shuttle sheath were then retrieved into the abdominal aorta and exchanged over a J-tip guidewire for a 6  Pinnacle sheath. This in turn was then removed with the successful application of an Angio-Seal closure device. The right groin site appeared soft without evidence of a hematoma or bleeding. Distal pulses remained palpable in the dorsalis pedis, and posterior regions bilaterally at the end of the procedure unchanged from prior to the procedure. The patient's general anesthesia was then reversed and the patient was extubated without difficulty. Upon recovery the patient demonstrated no new neurological signs or symptoms. She denied any headaches. She did have mild nausea which responded to the treatment. The patient was then transferred to the neuro PACU and then to neuro ICU to continue on IV heparin overnight, and close monitoring of the patient's blood pressure. The patient was able to handle a clear liquid diet overnight. The following morning the IV heparin was stopped. Patient was switched to aspirin 81 mg a day, and Brilinta 90 mg b.i.d. The patient's P2Y12 was in the region of approximately 70. Apparently she continued to have a mild ooze from her groin tract side. She did not have any underlying hematoma, and pressure was applied again with the patient maintained in a supine position for 4 more hours which resulted in the gradual stopping of the ooze. The distal pulses remained palpable bilaterally. The patient during the nighttime also had complained of left-sided chest pains. An EKG performed revealed no evidence of acute myocardial ischemia. A cardiology consult was obtained with recommendations to be followed. In view of the patient's slight ooze in the right groin puncture site, her discharge was delayed for another 12-24 hours in order to ensure complete hemostasis of the right puncture site. Towards the afternoon, the patient was able to maintain full p.o. intake. IMPRESSION: Status post endovascular treatment of a right internal carotid artery intracranial paraophthalmic region using the pipeline  flex flow diverter device as  described above. PLAN: Following discharge, the patient will be seen in the Outpatient in approximately 2 weeks time. During this time, the patient will be encouraged to maintain adequate hydration, and to continue her medications especially the dual antiplatelets. Patient would also be instructed to refrain from stooping, bending or lifting weights above 10 pounds. She apparently has support at home during this time. INDICATION: Patient with a recent history of worsening headaches over the past few months involving the right parietooccipital regions. Past history of migraine headaches. 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 Electronically Signed   By: Luanne Bras M.D.   On: 11/24/2017 17:32   Ir Angio Intra Extracran Sel Internal Carotid Uni R Mod Sed  Result Date: 11/28/2017 INDICATION: Patient with a recent history of worsening headaches over the past few months involving the right parietooccipital regions. Past history of migraine headaches. 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 CLINICAL DATA:  Discovery of recent intracranial aneurysms as above. EXAM: TRANSCATHETER THERAPY EMBOLIZATION; IR NEURO EACH ADD'L AFTER BASIC UNI RIGHT (MS); IR ANGIO INTRA EXTRACRAN SEL COM CAROTID INNOMINATE UNI LEFT MOD SED; ARTERIOGRAPHY; IR ANGIO VERTEBRAL SEL VERTEBRAL UNI LEFT MOD SED; IR ANGIO INTRA EXTRACRAN SEL INTERNAL CAROTID UNI RIGHT MOD SED COMPARISON:  Recent MRA MRI of the brain. MEDICATIONS: Heparin 4,500 units IV; none antibiotic was administered within 1 hour of the procedure. ANESTHESIA/SEDATION: Mac anesthesia for the initial diagnostic portion and general anesthesia for the intervention portion. CONTRAST:  Isovue 300 approximately 140 mL. FLUOROSCOPY TIME:  Fluoroscopy Time: 38 minutes 36  seconds (1461 mGy). COMPLICATIONS: None immediate. TECHNIQUE: Informed written consent was obtained from the patient after a thorough discussion of the procedural risks, benefits and alternatives. All questions were addressed. Maximal Sterile Barrier Technique was utilized including caps, mask, sterile gowns, sterile gloves, sterile drape, hand hygiene and skin antiseptic. A timeout was performed prior to the initiation of the procedure. The right groin was prepped and draped in the usual sterile fashion. Thereafter using modified Seldinger technique, transfemoral access into the right common femoral artery was obtained without difficulty. Over a 0.035 inch guidewire, a 5 French Pinnacle sheath was inserted. Through this, and also over 0.035 inch guidewire, a 5 [place] JB 1 catheter was advanced to the aortic arch region and selectively positioned in the right common carotid artery, right subclavian artery, the left common carotid artery and the left vertebral artery. A 3D rotational arteriogram was also performed of the right anterior circulation intracranially from a right common carotid artery injection. 3D reconstructions were then performed on a separate workstation. FINDINGS: The right subclavian arteriogram demonstrates the right vertebral artery origin to be widely patent. This is the hypoplastic right vertebral artery which ascends to the cranial skull base. Faint opacification of the right posterior-inferior cerebellar artery and the right vertebrobasilar junction proximally is seen. The right common carotid arteriogram demonstrates the right external carotid artery and its major branches to be widely patent. The right internal carotid artery at the bulb to the cranial skull base opacifies normally. Patency is seen of the petrous, cavernous and supraclinoid segments. A right posterior communicating artery is seen opacifying the right posterior cerebral artery distribution. The right middle cerebral artery  and transiently the right anterior cerebral artery opacify into the capillary and venous phases. Arising in the right internal carotid artery paraophthalmic region just distal to the origin of the  right ophthalmic artery is a slight bilobed saccular aneurysm projecting superiorly. Similarly there is an aneurysm arising from the inferior division of the right middle cerebral artery proximally. 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 Also confirmed by the 3D rotational arteriogram is an approximately 2.9 mm x 2.7 mm saccular outpouching from the proximal inferior division of the right middle cerebral artery. This, however, appears to have a vessel arising from the proximal fundus of the aneurysm. The left common carotid arteriogram demonstrates the left external carotid artery and its major branches to be widely patent. The left internal carotid artery proximally to the cranial skull base opacifies normally. The petrous, cavernous and supraclinoid segments demonstrate wide patency. The left middle cerebral artery and the left anterior cerebral artery opacify into the capillary and venous phases. There is prompt cross filling via the anterior communicating artery of the right anterior cerebral artery A2 segment into the capillary and venous phases. The venous phase also demonstrates a focal area of severe attenuation of the caliber of the right transverse sinus. The dominant left vertebral artery origin is widely patent. The vessel is seen to opacify normally to the cranial skull base. Wide patency is seen of the left vertebrobasilar junction and the left posterior-inferior cerebellar artery. The basilar artery, the left posterior cerebral artery, the superior cerebellar arteries and the anterior-inferior cerebellar arteries opacify into the capillary and venous phases. ENDOVASCULAR EMBOLIZATION OF A RIGHT  INTERNAL CAROTID ARTERY PARAOPHTHALMIC REGION ANEURYSM USING THE PIPELINE FLOW DIVERTER DEVICE. The angiographic findings were reviewed with the patient and the patient's family. 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 Again the endovascular option of treatment of the right paraophthalmic region aneurysm with a pipeline flow diverter device with periprocedural risk of thromboembolic stroke of approximately 1%, and the remote possibility of a delayed hemorrhage intracranially were all reviewed. 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 The patient was put under general anesthesia by the Department of Anesthesiology at [facility]. The 5 [place] 1 catheter in the right common carotid artery was then exchanged over a 0.035 inch 300 cm Constance Holster exchange guidewire for a 6 French 80 cm Cook shuttle sheath using biplane roadmap technique and constant fluoroscopic guidance. Good aspiration obtained from the hub of the [facility] shuttle sheath. A gentle contrast injection demonstrated no evidence of spasms, dissections or of intraluminal filling defects. Over a 0.035 inch Roadrunner guidewire, using biplane roadmap technique and constant fluoroscopic guidance, a Navien 5 French 115 cm guide catheter was then advanced to the petrous cavernous segment of the right internal carotid artery. The guidewire was removed. Good aspiration obtained from the hub of the Navien guide catheter. A control arteriogram performed centered intracranially demonstrated no change in the intracranial or  extracranial circulations. At this time, the [facility] shuttle sheath was advanced to the distal cervical right ICA. Over a 0.014 inch Softip Synchro micro guidewire, a Phenom 027 microcatheter was advanced to the distal 5 [place] Navien guide catheter. With the micro guidewire leading with a J-tip configuration, the combination was navigated without  difficulty to the supraclinoid right ICA. Using a torque device, the micro guidewire was advanced to the M2 M3 region of the inferior division of the right middle cerebral artery followed by the microcatheter. The guidewire was removed. Good aspiration obtained from the hub of the microcatheter. A gentle contrast injection demonstrates safe positioning of tip of the microcatheter. This was then connected to continuous heparinized saline infusion. Measurements had been performed of the right internal carotid artery, just distal and proximal to the aneurysm. It was decided to use a 125 x 16 mm pipeline flex flow diverter device. This was then advanced in a coaxial manner and with constant heparinized saline infusion using biplane roadmap technique and constant fluoroscopic guidance to the distal end of the microcatheter. The O ring on the delivery microcatheter was then loosened. The entire system was then straightened. With slight forward gentle traction with the right hand on the delivery micro guidewire, with the left hand the delivery microcatheter was retrieved unsheathing the distal wire on the device, and the distal portion of the device itself. This was continued until there was a cigar configuration of the device distally. The system was then slightly retrieved more proximally with gentle to and fro motion of the microcatheter. This resulted in the complete opening of the distal device. The combination was then retrieved proximally to the landing zone distally of the device which was just proximal to the origin of the right posterior communicating artery. Once there,  the micro guidewire was then advanced to deploy and deliver the device. During this time fluffing of the device was performed with the advancement and to and fro motion of the microcatheter. This resulted in good apposition with the native vessel. Also the tip of the microcatheter was maintained centralized within the native vessel. Once the entire device had been deployed, a control arteriogram performed through the 5 [place] Navien guide catheter in the petrous segment of the right internal carotid artery demonstrated excellent apposition with stasis within the aneurysm itself. Intracranially no evidence of intracranial filling defects were seen. The micro catheter was then advanced through the delivery device into the right MCA and the wire was captured into the microcatheter. The combination was then gently retrieved and removed ensuring no entanglement of the device. None was observed. Control arteriograms were then performed at 15,30 and 40 minutes post the deployment of the device. No intracranial filling defects or intra device irregularities were seen. The patient's ACT was maintained in the region of 200-210 seconds. Hemodynamically and neurologically the patient remained stable. No evidence of extravasation was noted either. The 5 [place] Navien guide catheter and the 6 [place] Cook shuttle sheath were then retrieved into the abdominal aorta and exchanged over a J-tip guidewire for a 6 Pinnacle sheath. This in turn was then removed with the successful application of an Angio-Seal closure device. The right groin site appeared soft without evidence of a hematoma or bleeding. Distal pulses remained palpable in the dorsalis pedis, and posterior regions bilaterally at the end of the procedure unchanged from prior to the procedure. The patient's general anesthesia was then reversed and the patient was extubated without difficulty. Upon recovery the patient demonstrated no new neurological signs or symptoms. She  denied any headaches. She did have mild nausea which responded to the treatment. The patient was then transferred to the neuro PACU and then to neuro ICU to continue on IV heparin overnight, and close monitoring of the patient's blood pressure. The patient was able to handle  a clear liquid diet overnight. The following morning the IV heparin was stopped. Patient was switched to aspirin 81 mg a day, and Brilinta 90 mg b.i.d. The patient's P2Y12 was in the region of approximately 70. Apparently she continued to have a mild ooze from her groin tract side. She did not have any underlying hematoma, and pressure was applied again with the patient maintained in a supine position for 4 more hours which resulted in the gradual stopping of the ooze. The distal pulses remained palpable bilaterally. The patient during the nighttime also had complained of left-sided chest pains. An EKG performed revealed no evidence of acute myocardial ischemia. A cardiology consult was obtained with recommendations to be followed. In view of the patient's slight ooze in the right groin puncture site, her discharge was delayed for another 12-24 hours in order to ensure complete hemostasis of the right puncture site. Towards the afternoon, the patient was able to maintain full p.o. intake. 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 PLAN: Following discharge, the patient will be seen in the Outpatient in approximately 2 weeks time. During this time, the patient will be encouraged to maintain adequate hydration, and to continue her medications especially the dual antiplatelets. Patient would also be instructed to refrain from stooping, bending or lifting weights above 10 pounds. She apparently has support at home during this time. INDICATION: Patient with a recent history of worsening headaches over the past few  months involving the right parietooccipital regions. Past history of migraine headaches. 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 Electronically Signed   By: Luanne Bras M.D.   On: 11/24/2017 17:32   Ir Angio Vertebral Sel Subclavian Innominate Uni R Mod Sed  Result Date: 11/28/2017 INDICATION: Patient with a recent history of worsening headaches over the past few months involving the right parietooccipital regions. Past history of migraine headaches. 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 CLINICAL DATA:  Discovery of recent intracranial aneurysms as above. EXAM: TRANSCATHETER THERAPY EMBOLIZATION; IR NEURO EACH ADD'L AFTER BASIC UNI RIGHT (MS); IR ANGIO INTRA EXTRACRAN SEL COM CAROTID INNOMINATE UNI LEFT MOD SED; ARTERIOGRAPHY; IR ANGIO VERTEBRAL SEL VERTEBRAL UNI LEFT MOD SED; IR ANGIO INTRA EXTRACRAN SEL INTERNAL CAROTID UNI RIGHT MOD SED COMPARISON:  Recent MRA MRI of the brain. MEDICATIONS: Heparin 4,500 units IV; none antibiotic was administered within 1 hour of the procedure. ANESTHESIA/SEDATION: Mac anesthesia for the initial diagnostic portion and general anesthesia for the intervention portion. CONTRAST:  Isovue 300 approximately 140 mL. FLUOROSCOPY TIME:  Fluoroscopy Time: 38 minutes 36 seconds (1461 mGy). COMPLICATIONS: None immediate. TECHNIQUE: Informed written consent was obtained from the patient after a thorough discussion of the procedural risks, benefits and alternatives. All questions were addressed. Maximal Sterile Barrier Technique was utilized including caps, mask, sterile gowns, sterile gloves, sterile drape, hand hygiene and skin antiseptic. A timeout was performed prior to the initiation of the procedure. The right groin was prepped and draped in the usual sterile fashion. Thereafter using modified  Seldinger technique, transfemoral access into the right common femoral artery was obtained without difficulty. Over a 0.035 inch guidewire, a 5 French Pinnacle sheath was inserted. Through this, and also over 0.035 inch guidewire, a 5 [place] JB 1 catheter was advanced to the aortic arch region and selectively positioned  in the right common carotid artery, right subclavian artery, the left common carotid artery and the left vertebral artery. A 3D rotational arteriogram was also performed of the right anterior circulation intracranially from a right common carotid artery injection. 3D reconstructions were then performed on a separate workstation. FINDINGS: The right subclavian arteriogram demonstrates the right vertebral artery origin to be widely patent. This is the hypoplastic right vertebral artery which ascends to the cranial skull base. Faint opacification of the right posterior-inferior cerebellar artery and the right vertebrobasilar junction proximally is seen. The right common carotid arteriogram demonstrates the right external carotid artery and its major branches to be widely patent. The right internal carotid artery at the bulb to the cranial skull base opacifies normally. Patency is seen of the petrous, cavernous and supraclinoid segments. A right posterior communicating artery is seen opacifying the right posterior cerebral artery distribution. The right middle cerebral artery and transiently the right anterior cerebral artery opacify into the capillary and venous phases. Arising in the right internal carotid artery paraophthalmic region just distal to the origin of the right ophthalmic artery is a slight bilobed saccular aneurysm projecting superiorly. Similarly there is an aneurysm arising from the inferior division of the right middle cerebral artery proximally. The 3D rotational reconstruction confirmed the presence of an approximately 3.5 mm x 3 mm wide neck right internal carotid artery  paraophthalmic aneurysm. Also confirmed by the 3D rotational arteriogram is an approximately 2.9 mm x 2.7 mm saccular outpouching from the proximal inferior division of the right middle cerebral artery. This, however, appears to have a vessel arising from the proximal fundus of the aneurysm. The left common carotid arteriogram demonstrates the left external carotid artery and its major branches to be widely patent. The left internal carotid artery proximally to the cranial skull base opacifies normally. The petrous, cavernous and supraclinoid segments demonstrate wide patency. The left middle cerebral artery and the left anterior cerebral artery opacify into the capillary and venous phases. There is prompt cross filling via the anterior communicating artery of the right anterior cerebral artery A2 segment into the capillary and venous phases. The venous phase also demonstrates a focal area of severe attenuation of the caliber of the right transverse sinus. The dominant left vertebral artery origin is widely patent. The vessel is seen to opacify normally to the cranial skull base. Wide patency is seen of the left vertebrobasilar junction and the left posterior-inferior cerebellar artery. The basilar artery, the left posterior cerebral artery, the superior cerebellar arteries and the anterior-inferior cerebellar arteries opacify into the capillary and venous phases. 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 The angiographic findings were reviewed with the patient and the patient's family. 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 Again given was the option of continued medical surveillance with MRA of the brain of the 2 aneurysms, versus consideration of endovascular treatment of the right internal carotid artery paraophthalmic  region aneurysm. Again the endovascular option of treatment of the right paraophthalmic region aneurysm with a pipeline flow diverter device with periprocedural risk of thromboembolic stroke of approximately 1%, and the remote possibility of a delayed hemorrhage intracranially were all reviewed. 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 The patient was put under general anesthesia by the Department of Anesthesiology at [facility]  [place] 1 catheter in the right common carotid artery was then exchanged over a 0.035 inch 300 cm Constance Holster exchange guidewire for a 6 French 80 cm Cook shuttle sheath using biplane roadmap technique and constant fluoroscopic guidance. Good aspiration obtained from the hub of the [facility] shuttle sheath. A gentle contrast injection demonstrated no evidence of spasms, dissections or of intraluminal filling defects. Over a 0.035 inch Roadrunner guidewire, using biplane roadmap technique and constant fluoroscopic guidance, a Navien 5 French 115 cm guide catheter was then advanced to the petrous cavernous segment of the right internal carotid artery. The guidewire was removed. Good aspiration obtained from the hub of the Navien guide catheter. A control arteriogram performed centered intracranially demonstrated no change in the intracranial or extracranial circulations. At this time, the [facility] shuttle sheath was advanced to the distal cervical right ICA. Over a 0.014 inch Softip Synchro micro guidewire, a Phenom 027 microcatheter was advanced to the distal 5 [place] Navien guide catheter. With the micro guidewire leading with a J-tip configuration, the combination was navigated without difficulty to the supraclinoid right ICA. Using a torque device, the micro guidewire was advanced to the M2 M3 region of the inferior division of the right middle cerebral artery  followed by the microcatheter. The guidewire was removed. Good aspiration obtained from the hub of the microcatheter. A gentle contrast injection demonstrates safe positioning of tip of the microcatheter. This was then connected to continuous heparinized saline infusion. Measurements had been performed of the right internal carotid artery, just distal and proximal to the aneurysm. It was decided to use a 125 x 16 mm pipeline flex flow diverter device. This was then advanced in a coaxial manner and with constant heparinized saline infusion using biplane roadmap technique and constant fluoroscopic guidance to the distal end of the microcatheter. The O ring on the delivery microcatheter was then loosened. The entire system was then straightened. With slight forward gentle traction with the right hand on the delivery micro guidewire, with the left hand the delivery microcatheter was retrieved unsheathing the distal wire on the device, and the distal portion of the device itself. This was continued until there was a cigar configuration of the device distally. The system was then slightly retrieved more proximally with gentle to and fro motion of the microcatheter. This resulted in the complete opening of the distal device. The combination was then retrieved proximally to the landing zone distally of the device which was just proximal to the origin of the right posterior communicating artery. Once there, the micro guidewire was then advanced to deploy and deliver the device. During this time fluffing of the device was performed with the advancement and to and fro motion of the microcatheter. This resulted in good apposition with the native vessel. Also the tip of the microcatheter was maintained centralized within the native vessel. Once the entire device had been deployed, a control arteriogram performed through the 5 [place] Navien guide catheter in the petrous segment of the right internal carotid artery demonstrated  excellent apposition with stasis within the aneurysm itself. Intracranially no evidence of intracranial filling defects were seen. The micro catheter was then advanced through the delivery device into the right MCA and the wire was captured into the microcatheter. The combination was then gently retrieved and removed ensuring no entanglement of the device. None was observed. Control arteriograms were then performed at 15,30 and 40 minutes post the deployment of the device. No intracranial filling defects or  intra device irregularities were seen. The patient's ACT was maintained in the region of 200-210 seconds. Hemodynamically and neurologically the patient remained stable. No evidence of extravasation was noted either. The 5 [place] Navien guide catheter and the 6 [place] Cook shuttle sheath were then retrieved into the abdominal aorta and exchanged over a J-tip guidewire for a 6 Pinnacle sheath. This in turn was then removed with the successful application of an Angio-Seal closure device. The right groin site appeared soft without evidence of a hematoma or bleeding. Distal pulses remained palpable in the dorsalis pedis, and posterior regions bilaterally at the end of the procedure unchanged from prior to the procedure. The patient's general anesthesia was then reversed and the patient was extubated without difficulty. Upon recovery the patient demonstrated no new neurological signs or symptoms. She denied any headaches. She did have mild nausea which responded to the treatment. The patient was then transferred to the neuro PACU and then to neuro ICU to continue on IV heparin overnight, and close monitoring of the patient's blood pressure. The patient was able to handle a clear liquid diet overnight. The following morning the IV heparin was stopped. Patient was switched to aspirin 81 mg a day, and Brilinta 90 mg b.i.d. The patient's P2Y12 was in the region of approximately 70. Apparently she continued to have a mild  ooze from her groin tract side. She did not have any underlying hematoma, and pressure was applied again with the patient maintained in a supine position for 4 more hours which resulted in the gradual stopping of the ooze. The distal pulses remained palpable bilaterally. The patient during the nighttime also had complained of left-sided chest pains. An EKG performed revealed no evidence of acute myocardial ischemia. A cardiology consult was obtained with recommendations to be followed. In view of the patient's slight ooze in the right groin puncture site, her discharge was delayed for another 12-24 hours in order to ensure complete hemostasis of the right puncture site. Towards the afternoon, the patient was able to maintain full p.o. intake. 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 PLAN: Following discharge, the patient will be seen in the Outpatient in approximately 2 weeks time. During this time, the patient will be encouraged to maintain adequate hydration, and to continue her medications especially the dual antiplatelets. Patient would also be instructed to refrain from stooping, bending or lifting weights above 10 pounds. She apparently has support at home during this time. INDICATION: Patient with a recent history of worsening headaches over the past few months involving the right parietooccipital regions. Past history of migraine headaches. 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 Electronically Signed   By: Luanne Bras M.D.   On: 11/24/2017 17:32   Ir Angio Vertebral Sel Vertebral Uni L Mod Sed  Result Date: 11/28/2017 INDICATION: Patient with a recent history of worsening headaches over the past few months involving the right parietooccipital regions. Past history of  migraine headaches. 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 CLINICAL DATA:  Discovery of recent intracranial aneurysms as above. EXAM: TRANSCATHETER THERAPY EMBOLIZATION; IR NEURO EACH ADD'L AFTER BASIC UNI RIGHT (MS); IR ANGIO INTRA EXTRACRAN SEL COM CAROTID INNOMINATE UNI LEFT MOD SED; ARTERIOGRAPHY; IR ANGIO VERTEBRAL SEL VERTEBRAL UNI LEFT MOD SED; IR ANGIO INTRA EXTRACRAN SEL  INTERNAL CAROTID UNI RIGHT MOD SED COMPARISON:  Recent MRA MRI of the brain. MEDICATIONS: Heparin 4,500 units IV; none antibiotic was administered within 1 hour of the procedure. ANESTHESIA/SEDATION: Mac anesthesia for the initial diagnostic portion and general anesthesia for the intervention portion. CONTRAST:  Isovue 300 approximately 140 mL. FLUOROSCOPY TIME:  Fluoroscopy Time: 38 minutes 36 seconds (1461 mGy). COMPLICATIONS: None immediate. TECHNIQUE: Informed written consent was obtained from the patient after a thorough discussion of the procedural risks, benefits and alternatives. All questions were addressed. Maximal Sterile Barrier Technique was utilized including caps, mask, sterile gowns, sterile gloves, sterile drape, hand hygiene and skin antiseptic. A timeout was performed prior to the initiation of the procedure. The right groin was prepped and draped in the usual sterile fashion. Thereafter using modified Seldinger technique, transfemoral access into the right common femoral artery was obtained without difficulty. Over a 0.035 inch guidewire, a 5 French Pinnacle sheath was inserted. Through this, and also over 0.035 inch guidewire, a 5 [place] JB 1 catheter was advanced to the aortic arch region and selectively positioned in the right common carotid artery, right subclavian artery, the left common carotid artery and the left vertebral artery. A 3D rotational arteriogram was also performed of the right anterior circulation  intracranially from a right common carotid artery injection. 3D reconstructions were then performed on a separate workstation. FINDINGS: The right subclavian arteriogram demonstrates the right vertebral artery origin to be widely patent. This is the hypoplastic right vertebral artery which ascends to the cranial skull base. Faint opacification of the right posterior-inferior cerebellar artery and the right vertebrobasilar junction proximally is seen. The right common carotid arteriogram demonstrates the right external carotid artery and its major branches to be widely patent. The right internal carotid artery at the bulb to the cranial skull base opacifies normally. Patency is seen of the petrous, cavernous and supraclinoid segments. A right posterior communicating artery is seen opacifying the right posterior cerebral artery distribution. The right middle cerebral artery and transiently the right anterior cerebral artery opacify into the capillary and venous phases. Arising in the right internal carotid artery paraophthalmic region just distal to the origin of the right ophthalmic artery is a slight bilobed saccular aneurysm projecting superiorly. Similarly there is an aneurysm arising from the inferior division of the right middle cerebral artery proximally. 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 Also confirmed by the 3D rotational arteriogram is an approximately 2.9 mm x 2.7 mm saccular outpouching from the proximal inferior division of the right middle cerebral artery. This, however, appears to have a vessel arising from the proximal fundus of the aneurysm. The left common carotid arteriogram demonstrates the left external carotid artery and its major branches to be widely patent. The left internal carotid artery proximally to the cranial skull base opacifies normally. The petrous, cavernous and  supraclinoid segments demonstrate wide patency. The left middle cerebral artery and the left anterior cerebral artery opacify into the capillary and venous phases. There is prompt cross filling via the anterior communicating artery of the right anterior cerebral artery A2 segment into the capillary and venous phases. The venous phase also demonstrates a focal area of severe attenuation of the caliber of the right transverse sinus. The dominant left vertebral artery origin is widely patent. The vessel is seen to opacify normally to the cranial skull base. Wide patency is seen of the left vertebrobasilar junction and  the left posterior-inferior cerebellar artery. The basilar artery, the left posterior cerebral artery, the superior cerebellar arteries and the anterior-inferior cerebellar arteries opacify into the capillary and venous phases. 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 The angiographic findings were reviewed with the patient and the patient's family. 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 Again the endovascular option of treatment of the right paraophthalmic region aneurysm with a pipeline flow diverter device with periprocedural risk of thromboembolic stroke of approximately 1%, and the remote possibility of a delayed hemorrhage intracranially were all reviewed. 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 The patient was put under general anesthesia by the Department of  Anesthesiology at [facility]. The 5 [place] 1 catheter in the right common carotid artery was then exchanged over a 0.035 inch 300 cm Constance Holster exchange guidewire for a 6 French 80 cm Cook shuttle sheath using biplane roadmap technique and constant fluoroscopic guidance. Good aspiration obtained from the hub of the [facility] shuttle sheath. A gentle contrast injection demonstrated no evidence of spasms, dissections or of intraluminal filling defects. Over a 0.035 inch Roadrunner guidewire, using biplane roadmap technique and constant fluoroscopic guidance, a Navien 5 French 115 cm guide catheter was then advanced to the petrous cavernous segment of the right internal carotid artery. The guidewire was removed. Good aspiration obtained from the hub of the Navien guide catheter. A control arteriogram performed centered intracranially demonstrated no change in the intracranial or extracranial circulations. At this time, the [facility] shuttle sheath was advanced to the distal cervical right ICA. Over a 0.014 inch Softip Synchro micro guidewire, a Phenom 027 microcatheter was advanced to the distal 5 [place] Navien guide catheter. With the micro guidewire leading with a J-tip configuration, the combination was navigated without difficulty to the supraclinoid right ICA. Using a torque device, the micro guidewire was advanced to the M2 M3 region of the inferior division of the right middle cerebral artery followed by the microcatheter. The guidewire was removed. Good aspiration obtained from the hub of the microcatheter. A gentle contrast injection demonstrates safe positioning of tip of the microcatheter. This was then connected to continuous heparinized saline infusion. Measurements had been performed of the right internal carotid artery, just distal and proximal to the aneurysm. It was decided to use a 125 x 16 mm pipeline flex flow diverter device. This was then advanced in a coaxial manner and with constant heparinized  saline infusion using biplane roadmap technique and constant fluoroscopic guidance to the distal end of the microcatheter. The O ring on the delivery microcatheter was then loosened. The entire system was then straightened. With slight forward gentle traction with the right hand on the delivery micro guidewire, with the left hand the delivery microcatheter was retrieved unsheathing the distal wire on the device, and the distal portion of the device itself. This was continued until there was a cigar configuration of the device distally. The system was then slightly retrieved more proximally with gentle to and fro motion of the microcatheter. This resulted in the complete opening of the distal device. The combination was then  retrieved proximally to the landing zone distally of the device which was just proximal to the origin of the right posterior communicating artery. Once there, the micro guidewire was then advanced to deploy and deliver the device. During this time fluffing of the device was performed with the advancement and to and fro motion of the microcatheter. This resulted in good apposition with the native vessel. Also the tip of the microcatheter was maintained centralized within the native vessel. Once the entire device had been deployed, a control arteriogram performed through the 5 [place] Navien guide catheter in the petrous segment of the right internal carotid artery demonstrated excellent apposition with stasis within the aneurysm itself. Intracranially no evidence of intracranial filling defects were seen. The micro catheter was then advanced through the delivery device into the right MCA and the wire was captured into the microcatheter. The combination was then gently retrieved and removed ensuring no entanglement of the device. None was observed. Control arteriograms were then performed at 15,30 and 40 minutes post the deployment of the device. No intracranial filling defects or intra device  irregularities were seen. The patient's ACT was maintained in the region of 200-210 seconds. Hemodynamically and neurologically the patient remained stable. No evidence of extravasation was noted either. The 5 [place] Navien guide catheter and the 6 [place] Cook shuttle sheath were then retrieved into the abdominal aorta and exchanged over a J-tip guidewire for a 6 Pinnacle sheath. This in turn was then removed with the successful application of an Angio-Seal closure device. The right groin site appeared soft without evidence of a hematoma or bleeding. Distal pulses remained palpable in the dorsalis pedis, and posterior regions bilaterally at the end of the procedure unchanged from prior to the procedure. The patient's general anesthesia was then reversed and the patient was extubated without difficulty. Upon recovery the patient demonstrated no new neurological signs or symptoms. She denied any headaches. She did have mild nausea which responded to the treatment. The patient was then transferred to the neuro PACU and then to neuro ICU to continue on IV heparin overnight, and close monitoring of the patient's blood pressure. The patient was able to handle a clear liquid diet overnight. The following morning the IV heparin was stopped. Patient was switched to aspirin 81 mg a day, and Brilinta 90 mg b.i.d. The patient's P2Y12 was in the region of approximately 70. Apparently she continued to have a mild ooze from her groin tract side. She did not have any underlying hematoma, and pressure was applied again with the patient maintained in a supine position for 4 more hours which resulted in the gradual stopping of the ooze. The distal pulses remained palpable bilaterally. The patient during the nighttime also had complained of left-sided chest pains. An EKG performed revealed no evidence of acute myocardial ischemia. A cardiology consult was obtained with recommendations to be followed. In view of the patient's slight  ooze in the right groin puncture site, her discharge was delayed for another 12-24 hours in order to ensure complete hemostasis of the right puncture site. Towards the afternoon, the patient was able to maintain full p.o. intake. 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 PLAN: Following discharge, the patient will be seen in the Outpatient in approximately 2 weeks time. During this time, the patient will be encouraged to maintain adequate hydration, and to continue her medications especially the dual antiplatelets. Patient  would also be instructed to refrain from stooping, bending or lifting weights above 10 pounds. She apparently has support at home during this time. INDICATION: Patient with a recent history of worsening headaches over the past few months involving the right parietooccipital regions. Past history of migraine headaches. 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 Electronically Signed   By: Luanne Bras M.D.   On: 11/24/2017 17:32      Domenic Moras, PA-C 11/30/17 1037    Lajean Saver, MD 11/30/17 1126

## 2017-11-30 NOTE — Progress Notes (Signed)
Chief Complaint: Patient was seen in consultation today for  Chief Complaint  Patient presents with  . Bleeding/Bruising    Supervising Physician: Luanne Bras  Patient Status: [facility] - In-pt  History of Present Illness: Kara Ochoa is a 56 y.o. 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 Access via (R)femoral artery. Pt did have some persistent oozing from right groin post procedure, but eventually hemostasis achieved and pt discharged POD #1. Pt now in ER with c/o right groin swelling and bruising.  Pt seen in ED with Dr. Estanislado Pandy. Had CT which finds large hematoma in right groin, pseudoaneurysm not excluded. She is to undergo US duplex. Pt reports she has been taking her Brilinta every other day and 22m ASA daily.    Past Medical History:  Diagnosis Date  . Allergy history unknown   . Aneurysm ([place]   . Anxiety   . Arthritis   . Chest pain    Pressure  . Constipation   . CVA (cerebral vascular accident) ([place]    2016, 2018  . Depression   . Family history of adverse reaction to anesthesia    pt had mother had PONV, difficulty waking up  . Fatigue   . Fibromyalgia   . GERD (gastroesophageal reflux disease)   . Headache(784.0)   . Kidney stone   . Lightheadedness   . Migraines   . Mini stroke ([place] 2011  . OA (osteoarthritis)   . PONV (postoperative nausea and vomiting)   . SOB (shortness of breath)     Past Surgical History:  Procedure Laterality Date  . ABDOMINAL HYSTERECTOMY  2000  . BREAST CYST EXCISION     lanced   . CARDIOVASCULAR STRESS TEST  2011   with IV   . COLONOSCOPY W/ BIOPSIES AND POLYPECTOMY    . HEMORROIDECTOMY    . IR 3D INDEPENDENT WKST  11/23/2017  . IR ANGIO INTRA EXTRACRAN SEL COM CAROTID INNOMINATE UNI L MOD SED  11/23/2017  . IR ANGIO INTRA EXTRACRAN SEL INTERNAL CAROTID UNI R MOD SED   11/23/2017  . IR ANGIO VERTEBRAL SEL SUBCLAVIAN INNOMINATE UNI R MOD SED  11/23/2017  . IR ANGIO VERTEBRAL SEL VERTEBRAL UNI L MOD SED  11/23/2017  . IR ANGIOGRAM FOLLOW UP STUDY  11/23/2017  . IR RADIOLOGIST EVAL & MGMT  11/09/2017  . IR TRANSCATH/EMBOLIZ  11/23/2017  . RADIOLOGY WITH ANESTHESIA N/A 11/23/2017   Procedure: EMBOLIZATION;  Surgeon: DLuanne Bras MD;  [place] [facility]  Service: Radiology;  Laterality: N/A;    Allergies: Bee venom; Plavix [clopidogrel bisulfate]; Sausage [pickled meat]; Sulfa drugs cross reactors; Zonisamide; Codeine; Penicillins; and Hydrocodone  Medications: Prior to Admission medications   Medication Sig Start Date End Date Taking? Authorizing Provider  aspirin EC 81 MG tablet Take 81 mg by mouth daily.   Yes [provider]  Calcium Carbonate-Vit D-Min (CALCIUM 1200) 1200-1000 MG-UNIT CHEW Chew 1 tablet by mouth daily.   Yes [provider]  Cholecalciferol (VITAMIN D PO) Take 5,000 Units by mouth daily.    Yes [provider]  Cyanocobalamin (B-12) 1000 MCG/ML KIT Inject 1 mL as directed every 30 (thirty) days.   Yes [provider]  divalproex (DEPAKOTE) 250 MG DR tablet Take 250-500 mg by mouth See admin instructions. 1 tablet in the morning, 2 tablets in the evening    Yes [provider]  EPINEPHrine (EPIPEN  2-PAK) 0.3 mg/0.3 mL IJ SOAJ injection Inject 0.3 mg into the muscle daily as needed (allergic reaction).    Yes [provider]  gabapentin (NEURONTIN) 100 MG capsule Take 100 mg by mouth 3 (three) times daily.   Yes [provider]  Lactobacillus (ACIDOPHILUS PO) Take 1 capsule by mouth every evening. Probiotic 10 Billion +   Yes [provider]  LORazepam (ATIVAN) 1 MG tablet Take 0.5-1 mg by mouth 2 (two) times daily.    Yes [provider]  Misc Natural Products (APPLE CIDER VINEGAR DIET PO) Take 1 tablet by mouth daily.   Yes [provider]  omeprazole  (PRILOSEC) 40 MG capsule Take 40 mg by mouth 2 (two) times daily.   Yes [provider]  Oxycodone HCl 10 MG TABS Take 10 mg by mouth every 8 (eight) hours as needed (pain).    Yes [provider]  promethazine (PHENERGAN) 25 MG tablet Take 25 mg by mouth every 6 (six) hours as needed for nausea or vomiting.   Yes [provider]  ticagrelor (BRILINTA) 90 MG TABS tablet Take 90 mg by mouth every other day.    Yes [provider]  venlafaxine XR (EFFEXOR-XR) 150 MG 24 hr capsule Take 150 mg by mouth at bedtime.    Yes [provider]  vitamin B-12 (CYANOCOBALAMIN) 1000 MCG tablet Take 1,000 mcg by mouth daily.   Yes [provider]  vitamin E 400 UNIT capsule Take 400 Units by mouth daily.   Yes [provider]     Family History  Problem Relation Age of Onset  . Cancer Mother   . Depression Mother   . Valvular heart disease Mother        mitral valve reflux  . Other Father        degenerative bone   . Colon cancer Maternal Grandmother   . Stroke Maternal Grandfather   . Thyroid disease Daughter   . Basal cell carcinoma Daughter     Social History   Socioeconomic History  . Marital status: Legally Separated    Spouse name: Not on file  . Number of children: 4  . Years of education: 25  . Highest education level: Not on file  Occupational History  . 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 Financial resource strain: Not on file  . Food insecurity:    Worry: Not on file    Inability: Not on file  . Transportation needs:    Medical: Not on file    Non-medical: Not on file  Tobacco Use  . Smoking status: Current Every Day Smoker    Packs/day: 0.25    Years: 38.00    Pack years: 9.50    Types: Cigarettes  . Smokeless tobacco: Never Used  . Tobacco comment: approx 10 packs every 3 weeks  Substance and Sexual Activity  . Alcohol use: No  . 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 Sexual activity: Not on file  Lifestyle  . Physical activity:    Days per week: Not on file    Minutes per session: Not on file  . Stress: Not on file  Relationships  . Social connections:    Talks on phone: Not on file    Gets together: Not on file    Attends religious service: Not on file    Active member of club or organization: Not on file  Attends meetings of clubs or organizations: Not on file    Relationship status: Not on file  Other Topics Concern  . Not on file  Social History Narrative  . Not on file     Review of Systems: A 12 point ROS discussed and pertinent positives are indicated in the HPI above.  All other systems are negative.  Review of Systems  Vital Signs: BP (!) 88/56 (BP Location: Left Arm)   Pulse (!) 102   Temp 97.9 F (36.6 C) (Oral)   Resp 18   Ht '5\' 3"'  (1.6 m)   Wt 131 lb (59.4 kg)   SpO2 (!) 87%   BMI 23.21 kg/m   Physical Exam  Constitutional: She is oriented to person, place, and time. She appears well-developed. No distress.  HENT:  Head: Normocephalic.  Mouth/Throat: Oropharynx is clear and moist.  Cardiovascular: Normal rate, regular rhythm and normal heart sounds.  Extensive ecchymosis of right groin and thigh. Firm palpable hematoma, difficult to assess for pseudoaneurysm. Palpable pedal pulse, foot warm  Pulmonary/Chest: Effort normal and breath sounds normal. No respiratory distress.  Neurological: She is alert and oriented to person, place, and time. No cranial nerve deficit.  Skin: Skin is warm and dry.  Psychiatric: She has a normal mood and affect.     Labs:  CBC: Recent Labs    11/23/17 0655 11/24/17 0544 11/29/17 2129  WBC 7.1 7.9 11.8*  HGB 12.9 10.2* 9.6*  HCT 39.0 31.2* 31.7*  PLT 235 198 243    COAGS: Recent Labs    11/23/17 0655 11/29/17 2129  INR 0.91 0.88    BMP: Recent Labs    11/23/17 0655 11/24/17 0544 11/25/17 1728 11/29/17 2129  NA 144 142  --  140  K 3.4*  3.3* 3.7 4.3  CL 107 109  --  104  CO2 26 28  --  29  GLUCOSE 97 98  --  79  BUN 21* 12  --  14  CALCIUM 8.9 8.1*  --  9.1  CREATININE 0.73 0.65  --  0.67  GFRNONAA >60 >60  --  >60  GFRAA >60 >60  --  >60    LIVER FUNCTION TESTS: Recent Labs    11/29/17 2129  BILITOT 0.4  AST 17  ALT 14  ALKPHOS 37*  PROT 5.8*  ALBUMIN 3.2*    TUMOR MARKERS: No results for input(s): AFPTM, CEA, CA199, CHROMGRNA in the last 8760 hours.  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 Await Arterial US to further eval. May need VVS consult to determine if needs admission for surgical repair.     Thank you for this interesting consult.  I greatly enjoyed meeting Amaziah Ghosh and look forward to participating in their care.  A copy of this report was sent to the requesting provider on this date.  Electronically Signed: Ascencion Dike, PA-C 11/30/2017, 9:04 AM

## 2017-11-30 NOTE — ED Notes (Signed)
D/c reviewed with patient and spouse 

## 2017-12-07 ENCOUNTER — Other Ambulatory Visit: Payer: Self-pay | Admitting: Cardiology

## 2017-12-07 DIAGNOSIS — R079 Chest pain, unspecified: Secondary | ICD-10-CM

## 2017-12-11 ENCOUNTER — Ambulatory Visit (HOSPITAL_BASED_OUTPATIENT_CLINIC_OR_DEPARTMENT_OTHER)
Admission: RE | Admit: 2017-12-11 | Discharge: 2017-12-11 | Disposition: A | Discharge status: Home / Self Care | Payer: Medicare PPO | Source: Ambulatory Visit | Attending: Interventional Radiology | Admitting: Interventional Radiology

## 2017-12-11 ENCOUNTER — Ambulatory Visit (HOSPITAL_COMMUNITY)
Admission: RE | Admit: 2017-12-11 | Discharge: 2017-12-11 | Disposition: A | Discharge status: Home / Self Care | Payer: Medicare PPO | Source: Ambulatory Visit | Attending: Interventional Radiology | Admitting: Interventional Radiology

## 2017-12-11 ENCOUNTER — Encounter (HOSPITAL_COMMUNITY): Payer: Medicare PPO

## 2017-12-11 DIAGNOSIS — F17210 Nicotine dependence, cigarettes, uncomplicated: Secondary | ICD-10-CM | POA: Insufficient documentation

## 2017-12-11 DIAGNOSIS — T148XXA Other injury of unspecified body region, initial encounter: Secondary | ICD-10-CM | POA: Diagnosis not present

## 2017-12-11 DIAGNOSIS — Z09 Encounter for follow-up examination after completed treatment for conditions other than malignant neoplasm: Secondary | ICD-10-CM | POA: Diagnosis present

## 2017-12-11 DIAGNOSIS — Z7982 Long term (current) use of aspirin: Secondary | ICD-10-CM | POA: Insufficient documentation

## 2017-12-11 DIAGNOSIS — Z8774 Personal history of (corrected) congenital malformations of heart and circulatory system: Secondary | ICD-10-CM | POA: Insufficient documentation

## 2017-12-11 DIAGNOSIS — Z880 Allergy status to penicillin: Secondary | ICD-10-CM | POA: Insufficient documentation

## 2017-12-11 DIAGNOSIS — Z882 Allergy status to sulfonamides status: Secondary | ICD-10-CM | POA: Diagnosis not present

## 2017-12-11 DIAGNOSIS — Z7902 Long term (current) use of antithrombotics/antiplatelets: Secondary | ICD-10-CM | POA: Insufficient documentation

## 2017-12-11 DIAGNOSIS — F329 Major depressive disorder, single episode, unspecified: Secondary | ICD-10-CM | POA: Diagnosis not present

## 2017-12-11 DIAGNOSIS — G43909 Migraine, unspecified, not intractable, without status migrainosus: Secondary | ICD-10-CM | POA: Insufficient documentation

## 2017-12-11 DIAGNOSIS — I729 Aneurysm of unspecified site: Secondary | ICD-10-CM

## 2017-12-11 DIAGNOSIS — F419 Anxiety disorder, unspecified: Secondary | ICD-10-CM | POA: Diagnosis not present

## 2017-12-11 DIAGNOSIS — K219 Gastro-esophageal reflux disease without esophagitis: Secondary | ICD-10-CM | POA: Diagnosis not present

## 2017-12-11 DIAGNOSIS — M797 Fibromyalgia: Secondary | ICD-10-CM | POA: Insufficient documentation

## 2017-12-11 DIAGNOSIS — M1990 Unspecified osteoarthritis, unspecified site: Secondary | ICD-10-CM | POA: Insufficient documentation

## 2017-12-11 NOTE — Progress Notes (Signed)
Right groin pseudoaneurysm evaluation completed. There is no evidence of a pseudoaneurysm. Positive for a large hematoma in the groin. Graybar ElectricVirginia Rockell Faulks, RVS 12/11/2017, 4: 26 PM

## 2017-12-11 NOTE — Progress Notes (Signed)
Right lower extremity venous duplex completed. No evidence of a DVT or Baker's cyst. Kara DeitersVirginia Jamin Ochoa, RVS 12/11/2017, 4:24 PM

## 2017-12-11 NOTE — Consult Note (Signed)
Chief Complaint: Patient was seen in consultation today for right periophthalmic aneurysm s/p embolization.   Supervising Physician: Luanne Bras  Patient Status: [facility] - Out-pt  History of Present Illness: Kara Ochoa is a 57 y.o. female with a past medical history of TIA 2011, CVA 2016 and 2018, GERD, fibromyalgia, OA, arthritis, anxiety, and depression. She was seen in consultation 11/09/2017 with Dr. Estanislado Pandy for management of an intracranial aneurysm. She underwent embolization of right periophthalmic aneurysm 11/23/2017 with Dr. Estanislado Pandy.  Patient presents today for consultation regarding her right periophthalmic aneurysm embolization 11/23/2017 with Dr. Estanislado Pandy. Patient awake and alert sitting in chair. Accompanied by sister. Complains of left-sided headaches. States headaches occur everyday. Denies N/V associated with headaches. States these are stable compared to prior to procedure. Complains of intermittent dizziness. States this is stable since prior to procedure. Complains of intermittent blurred vision. States this is stable since prior to procedure. Complains of two episodes of right-sided tinnitus. States it occurred for 15 minutes each time, and then went away. Complains of right groin pain at site of incision. States that she has pain with walking and bruising down her calf. Denies weakness, numbness/tingling, diplopia, hearing changes, or speech difficulty.  Patient is currently taking Brilinta 90 mg once every other day and Aspirin 81 mg once daily.  Past Medical History:  Diagnosis Date  . Allergy history unknown   . Aneurysm ([place])   . Anxiety   . Arthritis   . Chest pain    Pressure  . Constipation   . CVA (cerebral vascular accident) ([place])    2016, 2018  . Depression   . Family history of adverse reaction to anesthesia    pt had mother had PONV, difficulty waking up  . Fatigue   . Fibromyalgia   . GERD (gastroesophageal reflux disease)   .  Headache(784.0)   . Kidney stone   . Lightheadedness   . Migraines   . Mini stroke ([place]) 2011  . OA (osteoarthritis)   . PONV (postoperative nausea and vomiting)   . SOB (shortness of breath)     Past Surgical History:  Procedure Laterality Date  . ABDOMINAL HYSTERECTOMY  2000  . BREAST CYST EXCISION     lanced   . CARDIOVASCULAR STRESS TEST  2011   with IV   . COLONOSCOPY W/ BIOPSIES AND POLYPECTOMY    . HEMORROIDECTOMY    . IR 3D INDEPENDENT WKST  11/23/2017  . IR ANGIO INTRA EXTRACRAN SEL COM CAROTID INNOMINATE UNI L MOD SED  11/23/2017  . IR ANGIO INTRA EXTRACRAN SEL INTERNAL CAROTID UNI R MOD SED  11/23/2017  . IR ANGIO VERTEBRAL SEL SUBCLAVIAN INNOMINATE UNI R MOD SED  11/23/2017  . IR ANGIO VERTEBRAL SEL VERTEBRAL UNI L MOD SED  11/23/2017  . IR ANGIOGRAM FOLLOW UP STUDY  11/23/2017  . IR RADIOLOGIST EVAL & MGMT  11/09/2017  . IR TRANSCATH/EMBOLIZ  11/23/2017  . RADIOLOGY WITH ANESTHESIA N/A 11/23/2017   Procedure: EMBOLIZATION;  Surgeon: Luanne Bras, MD;  Location: [facility];  Service: Radiology;  Laterality: N/A;    Allergies: Bee venom; Plavix [clopidogrel bisulfate]; Sausage [pickled meat]; Sulfa drugs cross reactors; Zonisamide; Codeine; Penicillins; and Hydrocodone  Medications: Prior to Admission medications   Medication Sig Start Date End Date Taking? Authorizing Provider  aspirin EC 81 MG tablet Take 81 mg by mouth daily.    [provider]  Calcium Carbonate-Vit D-Min (CALCIUM 1200) 1200-1000 MG-UNIT CHEW Chew 1 tablet by mouth daily.  [provider]  Cholecalciferol (VITAMIN D PO) Take 5,000 Units by mouth daily.     [provider]  Cyanocobalamin (B-12) 1000 MCG/ML KIT Inject 1 mL as directed every 30 (thirty) days.    [provider]  divalproex (DEPAKOTE) 250 MG DR tablet Take 250-500 mg by mouth See admin instructions. 1 tablet in the morning, 2 tablets in the evening     [provider]  EPINEPHrine (EPIPEN  2-PAK) 0.3 mg/0.3 mL IJ SOAJ injection Inject 0.3 mg into the muscle daily as needed (allergic reaction).     [provider]  gabapentin (NEURONTIN) 100 MG capsule Take 100 mg by mouth 3 (three) times daily.    [provider]  Lactobacillus (ACIDOPHILUS PO) Take 1 capsule by mouth every evening. Probiotic 10 Billion +    [provider]  LORazepam (ATIVAN) 1 MG tablet Take 0.5-1 mg by mouth 2 (two) times daily.     [provider]  Misc Natural Products (APPLE CIDER VINEGAR DIET PO) Take 1 tablet by mouth daily.    [provider]  omeprazole (PRILOSEC) 40 MG capsule Take 40 mg by mouth 2 (two) times daily.    [provider]  Oxycodone HCl 10 MG TABS Take 10 mg by mouth every 8 (eight) hours as needed (pain).     [provider]  promethazine (PHENERGAN) 25 MG tablet Take 25 mg by mouth every 6 (six) hours as needed for nausea or vomiting.    [provider]  ticagrelor (BRILINTA) 90 MG TABS tablet Take 90 mg by mouth every other day.     [provider]  venlafaxine XR (EFFEXOR-XR) 150 MG 24 hr capsule Take 150 mg by mouth at bedtime.     [provider]  vitamin B-12 (CYANOCOBALAMIN) 1000 MCG tablet Take 1,000 mcg by mouth daily.    [provider]  vitamin E 400 UNIT capsule Take 400 Units by mouth daily.    [provider]     Family History  Problem Relation Age of Onset  . Cancer Mother   . Depression Mother   . Valvular heart disease Mother        mitral valve reflux  . Other Father        degenerative bone   . Colon cancer Maternal Grandmother   . Stroke Maternal Grandfather   . Thyroid disease Daughter   . Basal cell carcinoma Daughter     Social History   Socioeconomic History  . Marital status: Legally Separated    Spouse name: Not on file  . Number of children: 4  . Years of education: 4  . Highest education level: Not on file  Occupational History  .  Occupation: Hotel manager: [employer]  . Financial resource strain: Not on file  . Food insecurity:    Worry: Not on file    Inability: Not on file  . Transportation needs:    Medical: Not on file    Non-medical: Not on file  Tobacco Use  . Smoking status: Current Every Day Smoker    Packs/day: 0.25    Years: 38.00    Pack years: 9.50    Types: Cigarettes  . Smokeless tobacco: Never Used  . Tobacco comment: approx 10 packs every 3 weeks  Substance and Sexual Activity  . Alcohol use: No  . 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 Sexual activity:  Not on file  Lifestyle  . Physical activity:    Days per week: Not on file    Minutes per session: Not on file  . Stress: Not on file  Relationships  . Social connections:    Talks on phone: Not on file    Gets together: Not on file    Attends religious service: Not on file    Active member of club or organization: Not on file    Attends meetings of clubs or organizations: Not on file    Relationship status: Not on file  Other Topics Concern  . Not on file  Social History Narrative  . Not on file     Review of Systems: A 12 point ROS discussed and pertinent positives are indicated in the HPI above.  All other systems are negative.  Review of Systems  Constitutional: Negative for chills and fever.  HENT: Positive for tinnitus. Negative for hearing loss.   Eyes: Positive for visual disturbance.  Respiratory: Negative for shortness of breath and wheezing.   Cardiovascular: Negative for chest pain and palpitations.  Neurological: Positive for dizziness and headaches. Negative for speech difficulty, weakness and numbness.  Psychiatric/Behavioral: Negative for behavioral problems and confusion.    Vital Signs: There were no vitals taken for this visit.  Physical Exam  Constitutional: She is oriented to person, place, and time. She appears well-developed and well-nourished. No  distress.  Pulmonary/Chest: Effort normal. No respiratory distress.  Neurological: She is alert and oriented to person, place, and time.  Skin: Skin is warm and dry.  Right groin incision with 4 in x 5 in hematoma with surrounding ecchymosis with ecchymosis down posterior thigh and calf, positive Pratt test in right calf.  Psychiatric: She has a normal mood and affect. Her behavior is normal. Judgment and thought content normal.  Nursing note and vitals reviewed.    Imaging: Ir Transcath/emboliz  Result Date: 11/28/2017 INDICATION: Patient with a recent history of worsening headaches over the past few months involving the right parietooccipital regions. Past history of migraine headaches. 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 CLINICAL DATA:  Discovery of recent intracranial aneurysms as above. EXAM: TRANSCATHETER THERAPY EMBOLIZATION; IR NEURO EACH ADD'L AFTER BASIC UNI RIGHT (MS); IR ANGIO INTRA EXTRACRAN SEL COM CAROTID INNOMINATE UNI LEFT MOD SED; ARTERIOGRAPHY; IR ANGIO VERTEBRAL SEL VERTEBRAL UNI LEFT MOD SED; IR ANGIO INTRA EXTRACRAN SEL INTERNAL CAROTID UNI RIGHT MOD SED COMPARISON:  Recent MRA MRI of the brain. MEDICATIONS: Heparin 4,500 units IV; none antibiotic was administered within 1 hour of the procedure. ANESTHESIA/SEDATION: Mac anesthesia for the initial diagnostic portion and general anesthesia for the intervention portion. CONTRAST:  Isovue 300 approximately 140 mL. FLUOROSCOPY TIME:  Fluoroscopy Time: 38 minutes 36 seconds (1461 mGy). COMPLICATIONS: None immediate. TECHNIQUE: Informed written consent was obtained from the patient after a thorough discussion of the procedural risks, benefits and alternatives. All questions were addressed. Maximal Sterile Barrier Technique was utilized including caps, mask, sterile gowns, sterile gloves, sterile drape, hand hygiene and skin antiseptic. A  timeout was performed prior to the initiation of the procedure. The right groin was prepped and draped in the usual sterile fashion. Thereafter using modified Seldinger technique, transfemoral access into the right common femoral artery was obtained without difficulty. Over a 0.035 inch guidewire, a 5 French Pinnacle sheath was inserted. Through this, and also over 0.035 inch guidewire, a 5 [place] JB 1 catheter was advanced to  the aortic arch region and selectively positioned in the right common carotid artery, right subclavian artery, the left common carotid artery and the left vertebral artery. A 3D rotational arteriogram was also performed of the right anterior circulation intracranially from a right common carotid artery injection. 3D reconstructions were then performed on a separate workstation. FINDINGS: The right subclavian arteriogram demonstrates the right vertebral artery origin to be widely patent. This is the hypoplastic right vertebral artery which ascends to the cranial skull base. Faint opacification of the right posterior-inferior cerebellar artery and the right vertebrobasilar junction proximally is seen. The right common carotid arteriogram demonstrates the right external carotid artery and its major branches to be widely patent. The right internal carotid artery at the bulb to the cranial skull base opacifies normally. Patency is seen of the petrous, cavernous and supraclinoid segments. A right posterior communicating artery is seen opacifying the right posterior cerebral artery distribution. The right middle cerebral artery and transiently the right anterior cerebral artery opacify into the capillary and venous phases. Arising in the right internal carotid artery paraophthalmic region just distal to the origin of the right ophthalmic artery is a slight bilobed saccular aneurysm projecting superiorly. Similarly there is an aneurysm arising from the inferior division of the right middle cerebral  artery proximally. 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 Also confirmed by the 3D rotational arteriogram is an approximately 2.9 mm x 2.7 mm saccular outpouching from the proximal inferior division of the right middle cerebral artery. This, however, appears to have a vessel arising from the proximal fundus of the aneurysm. The left common carotid arteriogram demonstrates the left external carotid artery and its major branches to be widely patent. The left internal carotid artery proximally to the cranial skull base opacifies normally. The petrous, cavernous and supraclinoid segments demonstrate wide patency. The left middle cerebral artery and the left anterior cerebral artery opacify into the capillary and venous phases. There is prompt cross filling via the anterior communicating artery of the right anterior cerebral artery A2 segment into the capillary and venous phases. The venous phase also demonstrates a focal area of severe attenuation of the caliber of the right transverse sinus. The dominant left vertebral artery origin is widely patent. The vessel is seen to opacify normally to the cranial skull base. Wide patency is seen of the left vertebrobasilar junction and the left posterior-inferior cerebellar artery. The basilar artery, the left posterior cerebral artery, the superior cerebellar arteries and the anterior-inferior cerebellar arteries opacify into the capillary and venous phases. 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 The angiographic findings were reviewed with the patient and the patient's family. 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 Again given was the option of continued medical  surveillance with MRA of the brain of the 2 aneurysms, versus consideration of endovascular treatment of the right internal carotid artery paraophthalmic region aneurysm. Again the endovascular option of treatment of the right paraophthalmic region aneurysm with a pipeline flow diverter device with periprocedural risk of thromboembolic stroke of approximately 1%, and the remote possibility of a delayed hemorrhage intracranially were all reviewed. 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 The patient was put under general anesthesia  by the Department of Anesthesiology at [facility]. The 5 [place] 1 catheter in the right common carotid artery was then exchanged over a 0.035 inch 300 cm Constance Holster exchange guidewire for a 6 French 80 cm Cook shuttle sheath using biplane roadmap technique and constant fluoroscopic guidance. Good aspiration obtained from the hub of the [facility] shuttle sheath. A gentle contrast injection demonstrated no evidence of spasms, dissections or of intraluminal filling defects. Over a 0.035 inch Roadrunner guidewire, using biplane roadmap technique and constant fluoroscopic guidance, a Navien 5 French 115 cm guide catheter was then advanced to the petrous cavernous segment of the right internal carotid artery. The guidewire was removed. Good aspiration obtained from the hub of the Navien guide catheter. A control arteriogram performed centered intracranially demonstrated no change in the intracranial or extracranial circulations. At this time, the [facility] shuttle sheath was advanced to the distal cervical right ICA. Over a 0.014 inch Softip Synchro micro guidewire, a Phenom 027 microcatheter was advanced to the distal 5 [place] Navien guide catheter. With the micro guidewire leading with a J-tip configuration, the combination was navigated without difficulty to the  supraclinoid right ICA. Using a torque device, the micro guidewire was advanced to the M2 M3 region of the inferior division of the right middle cerebral artery followed by the microcatheter. The guidewire was removed. Good aspiration obtained from the hub of the microcatheter. A gentle contrast injection demonstrates safe positioning of tip of the microcatheter. This was then connected to continuous heparinized saline infusion. Measurements had been performed of the right internal carotid artery, just distal and proximal to the aneurysm. It was decided to use a 125 x 16 mm pipeline flex flow diverter device. This was then advanced in a coaxial manner and with constant heparinized saline infusion using biplane roadmap technique and constant fluoroscopic guidance to the distal end of the microcatheter. The O ring on the delivery microcatheter was then loosened. The entire system was then straightened. With slight forward gentle traction with the right hand on the delivery micro guidewire, with the left hand the delivery microcatheter was retrieved unsheathing the distal wire on the device, and the distal portion of the device itself. This was continued until there was a cigar configuration of the device distally. The system was then slightly retrieved more proximally with gentle to and fro motion of the microcatheter. This resulted in the complete opening of the distal device. The combination was then retrieved proximally to the landing zone distally of the device which was just proximal to the origin of the right posterior communicating artery. Once there, the micro guidewire was then advanced to deploy and deliver the device. During this time fluffing of the device was performed with the advancement and to and fro motion of the microcatheter. This resulted in good apposition with the native vessel. Also the tip of the microcatheter was maintained centralized within the native vessel. Once the entire device had been  deployed, a control arteriogram performed through the 5 [place] Navien guide catheter in the petrous segment of the right internal carotid artery demonstrated excellent apposition with stasis within the aneurysm itself. Intracranially no evidence of intracranial filling defects were seen. The micro catheter was then advanced through the delivery device into the right MCA and the wire was captured into the microcatheter. The combination was then gently retrieved and removed ensuring no entanglement of the device. None was observed. Control arteriograms were then performed at 15,30 and 40 minutes post the deployment of  the device. No intracranial filling defects or intra device irregularities were seen. The patient's ACT was maintained in the region of 200-210 seconds. Hemodynamically and neurologically the patient remained stable. No evidence of extravasation was noted either. The 5 [place] Navien guide catheter and the 6 [place] Cook shuttle sheath were then retrieved into the abdominal aorta and exchanged over a J-tip guidewire for a 6 Pinnacle sheath. This in turn was then removed with the successful application of an Angio-Seal closure device. The right groin site appeared soft without evidence of a hematoma or bleeding. Distal pulses remained palpable in the dorsalis pedis, and posterior regions bilaterally at the end of the procedure unchanged from prior to the procedure. The patient's general anesthesia was then reversed and the patient was extubated without difficulty. Upon recovery the patient demonstrated no new neurological signs or symptoms. She denied any headaches. She did have mild nausea which responded to the treatment. The patient was then transferred to the neuro PACU and then to neuro ICU to continue on IV heparin overnight, and close monitoring of the patient's blood pressure. The patient was able to handle a clear liquid diet overnight. The following morning the IV heparin was stopped. Patient was  switched to aspirin 81 mg a day, and Brilinta 90 mg b.i.d. The patient's P2Y12 was in the region of approximately 70. Apparently she continued to have a mild ooze from her groin tract side. She did not have any underlying hematoma, and pressure was applied again with the patient maintained in a supine position for 4 more hours which resulted in the gradual stopping of the ooze. The distal pulses remained palpable bilaterally. The patient during the nighttime also had complained of left-sided chest pains. An EKG performed revealed no evidence of acute myocardial ischemia. A cardiology consult was obtained with recommendations to be followed. In view of the patient's slight ooze in the right groin puncture site, her discharge was delayed for another 12-24 hours in order to ensure complete hemostasis of the right puncture site. Towards the afternoon, the patient was able to maintain full p.o. intake. 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 PLAN: Following discharge, the patient will be seen in the Outpatient in approximately 2 weeks time. During this time, the patient will be encouraged to maintain adequate hydration, and to continue her medications especially the dual antiplatelets. Patient would also be instructed to refrain from stooping, bending or lifting weights above 10 pounds. She apparently has support at home during this time. INDICATION: Patient with a recent history of worsening headaches over the past few months involving the right parietooccipital regions. Past history of migraine headaches. 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 Electronically Signed   By: Luanne Bras M.D.   On: 11/24/2017 17:32   Ir Angiogram Follow Up Study  Result Date: 11/28/2017 INDICATION:  Patient with a recent history of worsening headaches over the past few months involving the right parietooccipital regions. Past history of migraine headaches. 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 CLINICAL DATA:  Discovery of recent intracranial aneurysms as above. EXAM: TRANSCATHETER THERAPY EMBOLIZATION; IR NEURO EACH ADD'L AFTER BASIC UNI RIGHT (MS); IR ANGIO INTRA EXTRACRAN SEL COM CAROTID INNOMINATE UNI LEFT MOD SED; ARTERIOGRAPHY; IR ANGIO VERTEBRAL SEL VERTEBRAL UNI LEFT MOD SED; IR ANGIO  INTRA EXTRACRAN SEL INTERNAL CAROTID UNI RIGHT MOD SED COMPARISON:  Recent MRA MRI of the brain. MEDICATIONS: Heparin 4,500 units IV; none antibiotic was administered within 1 hour of the procedure. ANESTHESIA/SEDATION: Mac anesthesia for the initial diagnostic portion and general anesthesia for the intervention portion. CONTRAST:  Isovue 300 approximately 140 mL. FLUOROSCOPY TIME:  Fluoroscopy Time: 38 minutes 36 seconds (1461 mGy). COMPLICATIONS: None immediate. TECHNIQUE: Informed written consent was obtained from the patient after a thorough discussion of the procedural risks, benefits and alternatives. All questions were addressed. Maximal Sterile Barrier Technique was utilized including caps, mask, sterile gowns, sterile gloves, sterile drape, hand hygiene and skin antiseptic. A timeout was performed prior to the initiation of the procedure. The right groin was prepped and draped in the usual sterile fashion. Thereafter using modified Seldinger technique, transfemoral access into the right common femoral artery was obtained without difficulty. Over a 0.035 inch guidewire, a 5 French Pinnacle sheath was inserted. Through this, and also over 0.035 inch guidewire, a 5 [place] JB 1 catheter was advanced to the aortic arch region and selectively positioned in the right common carotid artery, right subclavian artery, the left common  carotid artery and the left vertebral artery. A 3D rotational arteriogram was also performed of the right anterior circulation intracranially from a right common carotid artery injection. 3D reconstructions were then performed on a separate workstation. FINDINGS: The right subclavian arteriogram demonstrates the right vertebral artery origin to be widely patent. This is the hypoplastic right vertebral artery which ascends to the cranial skull base. Faint opacification of the right posterior-inferior cerebellar artery and the right vertebrobasilar junction proximally is seen. The right common carotid arteriogram demonstrates the right external carotid artery and its major branches to be widely patent. The right internal carotid artery at the bulb to the cranial skull base opacifies normally. Patency is seen of the petrous, cavernous and supraclinoid segments. A right posterior communicating artery is seen opacifying the right posterior cerebral artery distribution. The right middle cerebral artery and transiently the right anterior cerebral artery opacify into the capillary and venous phases. Arising in the right internal carotid artery paraophthalmic region just distal to the origin of the right ophthalmic artery is a slight bilobed saccular aneurysm projecting superiorly. Similarly there is an aneurysm arising from the inferior division of the right middle cerebral artery proximally. 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 Also confirmed by the 3D rotational arteriogram is an approximately 2.9 mm x 2.7 mm saccular outpouching from the proximal inferior division of the right middle cerebral artery. This, however, appears to have a vessel arising from the proximal fundus of the aneurysm. The left common carotid arteriogram demonstrates the left external carotid artery and its major branches to be widely  patent. The left internal carotid artery proximally to the cranial skull base opacifies normally. The petrous, cavernous and supraclinoid segments demonstrate wide patency. The left middle cerebral artery and the left anterior cerebral artery opacify into the capillary and venous phases. There is prompt cross filling via the anterior communicating artery of the right anterior cerebral artery A2 segment into the capillary and venous phases. The venous phase also demonstrates a focal area of severe attenuation of the caliber of the right transverse sinus. The dominant left vertebral artery origin is widely patent. The vessel is seen to opacify normally to the cranial skull base. Wide patency is seen of the left  vertebrobasilar junction and the left posterior-inferior cerebellar artery. The basilar artery, the left posterior cerebral artery, the superior cerebellar arteries and the anterior-inferior cerebellar arteries opacify into the capillary and venous phases. 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 The angiographic findings were reviewed with the patient and the patient's family. 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 Again the endovascular option of treatment of the right paraophthalmic region aneurysm with a pipeline flow diverter device with periprocedural risk of thromboembolic stroke of approximately 1%, and the remote possibility of a delayed hemorrhage intracranially were all reviewed. The patient again expressed her understanding and wanting to proceed with endovascular treatment of the right  internal carotid artery paraophthalmic region aneurysm. The patient was put under general anesthesia by the Department of Anesthesiology at [facility]. The 5 Wagner 1 catheter in the right common carotid artery was then exchanged over a 0.035 inch 300 cm Constance Holster exchange guidewire for a 6 French 80 cm Cook shuttle sheath using biplane roadmap technique and constant fluoroscopic guidance. Good aspiration obtained from the hub of the [facility] shuttle sheath. A gentle contrast injection demonstrated no evidence of spasms, dissections or of intraluminal filling defects. Over a 0.035 inch Roadrunner guidewire, using biplane roadmap technique and constant fluoroscopic guidance, a Navien 5 French 115 cm guide catheter was then advanced to the petrous cavernous segment of the right internal carotid artery. The guidewire was removed. Good aspiration obtained from the hub of the Navien guide catheter. A control arteriogram performed centered intracranially demonstrated no change in the intracranial or extracranial circulations. At this time, the [facility] shuttle sheath was advanced to the distal cervical right ICA. Over a 0.014 inch Softip Synchro micro guidewire, a Phenom 027 microcatheter was advanced to the distal 5 [place] Navien guide catheter. With the micro guidewire leading with a J-tip configuration, the combination was navigated without difficulty to the supraclinoid right ICA. Using a torque device, the micro guidewire was advanced to the M2 M3 region of the inferior division of the right middle cerebral artery followed by the microcatheter. The guidewire was removed. Good aspiration obtained from the hub of the microcatheter. A gentle contrast injection demonstrates safe positioning of tip of the microcatheter. This was then connected to continuous heparinized saline infusion. Measurements had been performed of the right internal carotid artery, just distal and proximal to the aneurysm. It was decided to use a  125 x 16 mm pipeline flex flow diverter device. This was then advanced in a coaxial manner and with constant heparinized saline infusion using biplane roadmap technique and constant fluoroscopic guidance to the distal end of the microcatheter. The O ring on the delivery microcatheter was then loosened. The entire system was then straightened. With slight forward gentle traction with the right hand on the delivery micro guidewire, with the left hand the delivery microcatheter was retrieved unsheathing the distal wire on the device, and the distal portion of the device itself. This was continued until there was a cigar configuration of the device distally. The system was then slightly retrieved more proximally with gentle to and fro motion of the microcatheter. This resulted in the complete opening of the distal device. The  combination was then retrieved proximally to the landing zone distally of the device which was just proximal to the origin of the right posterior communicating artery. Once there, the micro guidewire was then advanced to deploy and deliver the device. During this time fluffing of the device was performed with the advancement and to and fro motion of the microcatheter. This resulted in good apposition with the native vessel. Also the tip of the microcatheter was maintained centralized within the native vessel. Once the entire device had been deployed, a control arteriogram performed through the 5 [place] Navien guide catheter in the petrous segment of the right internal carotid artery demonstrated excellent apposition with stasis within the aneurysm itself. Intracranially no evidence of intracranial filling defects were seen. The micro catheter was then advanced through the delivery device into the right MCA and the wire was captured into the microcatheter. The combination was then gently retrieved and removed ensuring no entanglement of the device. None was observed. Control arteriograms were then  performed at 15,30 and 40 minutes post the deployment of the device. No intracranial filling defects or intra device irregularities were seen. The patient's ACT was maintained in the region of 200-210 seconds. Hemodynamically and neurologically the patient remained stable. No evidence of extravasation was noted either. The 5 [place] Navien guide catheter and the 6 [place] Cook shuttle sheath were then retrieved into the abdominal aorta and exchanged over a J-tip guidewire for a 6 Pinnacle sheath. This in turn was then removed with the successful application of an Angio-Seal closure device. The right groin site appeared soft without evidence of a hematoma or bleeding. Distal pulses remained palpable in the dorsalis pedis, and posterior regions bilaterally at the end of the procedure unchanged from prior to the procedure. The patient's general anesthesia was then reversed and the patient was extubated without difficulty. Upon recovery the patient demonstrated no new neurological signs or symptoms. She denied any headaches. She did have mild nausea which responded to the treatment. The patient was then transferred to the neuro PACU and then to neuro ICU to continue on IV heparin overnight, and close monitoring of the patient's blood pressure. The patient was able to handle a clear liquid diet overnight. The following morning the IV heparin was stopped. Patient was switched to aspirin 81 mg a day, and Brilinta 90 mg b.i.d. The patient's P2Y12 was in the region of approximately 70. Apparently she continued to have a mild ooze from her groin tract side. She did not have any underlying hematoma, and pressure was applied again with the patient maintained in a supine position for 4 more hours which resulted in the gradual stopping of the ooze. The distal pulses remained palpable bilaterally. The patient during the nighttime also had complained of left-sided chest pains. An EKG performed revealed no evidence of acute myocardial  ischemia. A cardiology consult was obtained with recommendations to be followed. In view of the patient's slight ooze in the right groin puncture site, her discharge was delayed for another 12-24 hours in order to ensure complete hemostasis of the right puncture site. Towards the afternoon, the patient was able to maintain full p.o. intake. 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 PLAN: Following discharge, the patient will be seen in the Outpatient in approximately 2 weeks time. During this time, the patient will be encouraged to maintain adequate hydration, and to continue her medications especially the  dual antiplatelets. Patient would also be instructed to refrain from stooping, bending or lifting weights above 10 pounds. She apparently has support at home during this time. INDICATION: Patient with a recent history of worsening headaches over the past few months involving the right parietooccipital regions. Past history of migraine headaches. 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 Electronically Signed   By: Luanne Bras M.D.   On: 11/24/2017 17:32   Ir 3d Coralyn Mark  Result Date: 11/29/2017 INDICATION: Patient with a recent history of worsening headaches over the past few months involving the right parietooccipital regions. Past history of migraine headaches. 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 CLINICAL DATA:  Discovery of recent intracranial aneurysms as above. EXAM: TRANSCATHETER THERAPY EMBOLIZATION; IR NEURO EACH ADD'L AFTER BASIC UNI RIGHT (MS); IR ANGIO INTRA EXTRACRAN SEL COM CAROTID INNOMINATE UNI LEFT MOD SED; ARTERIOGRAPHY; IR ANGIO VERTEBRAL SEL VERTEBRAL UNI  LEFT MOD SED; IR ANGIO INTRA EXTRACRAN SEL INTERNAL CAROTID UNI RIGHT MOD SED COMPARISON:  Recent MRA MRI of the brain. MEDICATIONS: Heparin 4,500 units IV; none antibiotic was administered within 1 hour of the procedure. ANESTHESIA/SEDATION: Mac anesthesia for the initial diagnostic portion and general anesthesia for the intervention portion. CONTRAST:  Isovue 300 approximately 140 mL. FLUOROSCOPY TIME:  Fluoroscopy Time: 38 minutes 36 seconds (1461 mGy). COMPLICATIONS: None immediate. TECHNIQUE: Informed written consent was obtained from the patient after a thorough discussion of the procedural risks, benefits and alternatives. All questions were addressed. Maximal Sterile Barrier Technique was utilized including caps, mask, sterile gowns, sterile gloves, sterile drape, hand hygiene and skin antiseptic. A timeout was performed prior to the initiation of the procedure. The right groin was prepped and draped in the usual sterile fashion. Thereafter using modified Seldinger technique, transfemoral access into the right common femoral artery was obtained without difficulty. Over a 0.035 inch guidewire, a 5 French Pinnacle sheath was inserted. Through this, and also over 0.035 inch guidewire, a 5 [place] JB 1 catheter was advanced to the aortic arch region and selectively positioned in the right common carotid artery, right subclavian artery, the left common carotid artery and the left vertebral artery. A 3D rotational arteriogram was also performed of the right anterior circulation intracranially from a right common carotid artery injection. 3D reconstructions were then performed on a separate workstation. FINDINGS: The right subclavian arteriogram demonstrates the right vertebral artery origin to be widely patent. This is the hypoplastic right vertebral artery which ascends to the cranial skull base. Faint opacification of the right posterior-inferior cerebellar artery and the right vertebrobasilar junction proximally  is seen. The right common carotid arteriogram demonstrates the right external carotid artery and its major branches to be widely patent. The right internal carotid artery at the bulb to the cranial skull base opacifies normally. Patency is seen of the petrous, cavernous and supraclinoid segments. A right posterior communicating artery is seen opacifying the right posterior cerebral artery distribution. The right middle cerebral artery and transiently the right anterior cerebral artery opacify into the capillary and venous phases. Arising in the right internal carotid artery paraophthalmic region just distal to the origin of the right ophthalmic artery is a slight bilobed saccular aneurysm projecting superiorly. Similarly there is an aneurysm arising from the inferior division of the right middle cerebral artery proximally. The 3D rotational reconstruction confirmed the presence of an approximately 3.5 mm x 3 mm wide  neck right internal carotid artery paraophthalmic aneurysm. Also confirmed by the 3D rotational arteriogram is an approximately 2.9 mm x 2.7 mm saccular outpouching from the proximal inferior division of the right middle cerebral artery. This, however, appears to have a vessel arising from the proximal fundus of the aneurysm. The left common carotid arteriogram demonstrates the left external carotid artery and its major branches to be widely patent. The left internal carotid artery proximally to the cranial skull base opacifies normally. The petrous, cavernous and supraclinoid segments demonstrate wide patency. The left middle cerebral artery and the left anterior cerebral artery opacify into the capillary and venous phases. There is prompt cross filling via the anterior communicating artery of the right anterior cerebral artery A2 segment into the capillary and venous phases. The venous phase also demonstrates a focal area of severe attenuation of the caliber of the right transverse sinus. The dominant  left vertebral artery origin is widely patent. The vessel is seen to opacify normally to the cranial skull base. Wide patency is seen of the left vertebrobasilar junction and the left posterior-inferior cerebellar artery. The basilar artery, the left posterior cerebral artery, the superior cerebellar arteries and the anterior-inferior cerebellar arteries opacify into the capillary and venous phases. 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 The angiographic findings were reviewed with the patient and the patient's family. 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 Again the endovascular option of treatment of the right paraophthalmic region aneurysm with a pipeline flow diverter device with periprocedural risk of thromboembolic stroke of approximately 1%, and the remote possibility of a delayed hemorrhage intracranially were all reviewed. 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 The patient was put under general anesthesia by the Department of Anesthesiology at [facility]. The 5 Rippey 1 catheter in the right common carotid artery was then exchanged over a 0.035 inch 300 cm Constance Holster exchange guidewire for a 6 French 80 cm Cook shuttle sheath using biplane roadmap technique and constant fluoroscopic guidance. Good aspiration obtained from the hub of the [facility] shuttle sheath. A gentle contrast injection demonstrated no evidence of spasms, dissections or of intraluminal filling  defects. Over a 0.035 inch Roadrunner guidewire, using biplane roadmap technique and constant fluoroscopic guidance, a Navien 5 French 115 cm guide catheter was then advanced to the petrous cavernous segment of the right internal carotid artery. The guidewire was removed. Good aspiration obtained from the hub of the Navien guide catheter. A control arteriogram performed centered intracranially demonstrated no change in the intracranial or extracranial circulations. At this time, the [facility] shuttle sheath was advanced to the distal cervical right ICA. Over a 0.014 inch Softip Synchro micro guidewire, a Phenom 027 microcatheter was advanced to the distal 5 [place] Navien guide catheter. With the micro guidewire leading with a J-tip configuration, the combination was navigated without difficulty to the supraclinoid right ICA. Using a torque device, the micro guidewire was advanced to the M2 M3 region of the inferior division of the right middle cerebral artery followed by the microcatheter. The guidewire was removed. Good aspiration obtained from the hub  of the microcatheter. A gentle contrast injection demonstrates safe positioning of tip of the microcatheter. This was then connected to continuous heparinized saline infusion. Measurements had been performed of the right internal carotid artery, just distal and proximal to the aneurysm. It was decided to use a 125 x 16 mm pipeline flex flow diverter device. This was then advanced in a coaxial manner and with constant heparinized saline infusion using biplane roadmap technique and constant fluoroscopic guidance to the distal end of the microcatheter. The O ring on the delivery microcatheter was then loosened. The entire system was then straightened. With slight forward gentle traction with the right hand on the delivery micro guidewire, with the left hand the delivery microcatheter was retrieved unsheathing the distal wire on the device, and the distal portion of the  device itself. This was continued until there was a cigar configuration of the device distally. The system was then slightly retrieved more proximally with gentle to and fro motion of the microcatheter. This resulted in the complete opening of the distal device. The combination was then retrieved proximally to the landing zone distally of the device which was just proximal to the origin of the right posterior communicating artery. Once there, the micro guidewire was then advanced to deploy and deliver the device. During this time fluffing of the device was performed with the advancement and to and fro motion of the microcatheter. This resulted in good apposition with the native vessel. Also the tip of the microcatheter was maintained centralized within the native vessel. Once the entire device had been deployed, a control arteriogram performed through the 5 [place] Navien guide catheter in the petrous segment of the right internal carotid artery demonstrated excellent apposition with stasis within the aneurysm itself. Intracranially no evidence of intracranial filling defects were seen. The micro catheter was then advanced through the delivery device into the right MCA and the wire was captured into the microcatheter. The combination was then gently retrieved and removed ensuring no entanglement of the device. None was observed. Control arteriograms were then performed at 15,30 and 40 minutes post the deployment of the device. No intracranial filling defects or intra device irregularities were seen. The patient's ACT was maintained in the region of 200-210 seconds. Hemodynamically and neurologically the patient remained stable. No evidence of extravasation was noted either. The 5 [place] Navien guide catheter and the 6 [place] Cook shuttle sheath were then retrieved into the abdominal aorta and exchanged over a J-tip guidewire for a 6 Pinnacle sheath. This in turn was then removed with the successful application of an  Angio-Seal closure device. The right groin site appeared soft without evidence of a hematoma or bleeding. Distal pulses remained palpable in the dorsalis pedis, and posterior regions bilaterally at the end of the procedure unchanged from prior to the procedure. The patient's general anesthesia was then reversed and the patient was extubated without difficulty. Upon recovery the patient demonstrated no new neurological signs or symptoms. She denied any headaches. She did have mild nausea which responded to the treatment. The patient was then transferred to the neuro PACU and then to neuro ICU to continue on IV heparin overnight, and close monitoring of the patient's blood pressure. The patient was able to handle a clear liquid diet overnight. The following morning the IV heparin was stopped. Patient was switched to aspirin 81 mg a day, and Brilinta 90 mg b.i.d. The patient's P2Y12 was in the region of approximately 70. Apparently she continued to have a mild  ooze from her groin tract side. She did not have any underlying hematoma, and pressure was applied again with the patient maintained in a supine position for 4 more hours which resulted in the gradual stopping of the ooze. The distal pulses remained palpable bilaterally. The patient during the nighttime also had complained of left-sided chest pains. An EKG performed revealed no evidence of acute myocardial ischemia. A cardiology consult was obtained with recommendations to be followed. In view of the patient's slight ooze in the right groin puncture site, her discharge was delayed for another 12-24 hours in order to ensure complete hemostasis of the right puncture site. Towards the afternoon, the patient was able to maintain full p.o. intake. 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 PLAN: Following discharge, the patient  will be seen in the Outpatient in approximately 2 weeks time. During this time, the patient will be encouraged to maintain adequate hydration, and to continue her medications especially the dual antiplatelets. Patient would also be instructed to refrain from stooping, bending or lifting weights above 10 pounds. She apparently has support at home during this time. INDICATION: Patient with a recent history of worsening headaches over the past few months involving the right parietooccipital regions. Past history of migraine headaches. 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 Electronically Signed   By: Luanne Bras M.D.   On: 11/24/2017 17:32   Ct Extremity Lower Right W Contrast  Result Date: 11/29/2017 CLINICAL DATA:  57 year old female with history notable for aneurysm repair via femoral puncture last week. Patient notes swelling and discoloration de for right thigh and right labia majora. EXAM: CT OF THE LOWER RIGHT EXTREMITY WITH CONTRAST TECHNIQUE: Multidetector CT imaging of the lower right extremity was performed according to the standard protocol following intravenous contrast administration. COMPARISON:  None. CONTRAST:  179m OMNIPAQUE IOHEXOL 300 MG/ML  SOLN FINDINGS: Bones/Joint/Cartilage Mild disc space narrowing at L4-5 with grade 1 anterolisthesis of L4 on L5 secondary to degenerative facet arthropathy of the included lower lumbar spine. Facet arthropathy is seen from L4 through S1. No acute fracture of the lumbar spine, sacrum or coccyx. The right iliac bone is intact.  The pubic rami are unremarkable. The included right hip joint, femoral head, femur, tibia and fibula are nonacute. The knee joint is maintained without joint effusion. The tibiotalar and subtalar joints, midfoot articulations and forefoot are unremarkable. No suspicious osseous lesions. No focal chondral defect. No joint effusion.  Ligaments Suboptimally assessed by CT. Muscles and Tendons No intramuscular hemorrhage, mass or atrophy. The extensor mechanism tendons about the knee are unremarkable. The tendons crossing the ankle joint are maintained without evidence of rupture, tenosynovitis or dislocation. Soft tissues Small subcutaneous slightly hyperdense fluid collection is seen overlying the right common femoral artery and vein measuring 3.5 x 1.6 x 3.5 cm. Findings likely represent postprocedural hematoma. Underlying soft tissue induration is seen without definite evidence of active hemorrhage. This could be further correlated with ultrasound to exclude possibility of a pseudoaneurysm. Tracking soft tissue edema and induration is identified along the anterior and medial aspect of the thigh with soft tissue edema about the knee and proximal calf as well. There is normal three-vessel runoff to the foot. IMPRESSION: 1. Amorphous soft tissue attenuation at prior femoral puncture site suspicious for postprocedural hematoma measuring 3.5 x 1.6 x 3.5 cm. No definite  evidence of active hemorrhage. The possibility of a pseudoaneurysm however is not entirely excluded. Suggest ultrasound for better assessment. 2. Soft tissue induration likely represents tracking subcutaneous fluid and edema along the right thigh, knee and proximal calf. 3. No acute osseous abnormality is visualized. Electronically Signed   By: Ashley Royalty M.D.   On: 11/29/2017 23:59   Ir Angio Intra Extracran Sel Com Carotid Innominate Uni L Mod Sed  Result Date: 11/28/2017 INDICATION: Patient with a recent history of worsening headaches over the past few months involving the right parietooccipital regions. Past history of migraine headaches. 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 CLINICAL DATA:  Discovery of recent intracranial aneurysms as above. EXAM: TRANSCATHETER THERAPY  EMBOLIZATION; IR NEURO EACH ADD'L AFTER BASIC UNI RIGHT (MS); IR ANGIO INTRA EXTRACRAN SEL COM CAROTID INNOMINATE UNI LEFT MOD SED; ARTERIOGRAPHY; IR ANGIO VERTEBRAL SEL VERTEBRAL UNI LEFT MOD SED; IR ANGIO INTRA EXTRACRAN SEL INTERNAL CAROTID UNI RIGHT MOD SED COMPARISON:  Recent MRA MRI of the brain. MEDICATIONS: Heparin 4,500 units IV; none antibiotic was administered within 1 hour of the procedure. ANESTHESIA/SEDATION: Mac anesthesia for the initial diagnostic portion and general anesthesia for the intervention portion. CONTRAST:  Isovue 300 approximately 140 mL. FLUOROSCOPY TIME:  Fluoroscopy Time: 38 minutes 36 seconds (1461 mGy). COMPLICATIONS: None immediate. TECHNIQUE: Informed written consent was obtained from the patient after a thorough discussion of the procedural risks, benefits and alternatives. All questions were addressed. Maximal Sterile Barrier Technique was utilized including caps, mask, sterile gowns, sterile gloves, sterile drape, hand hygiene and skin antiseptic. A timeout was performed prior to the initiation of the procedure. The right groin was prepped and draped in the usual sterile fashion. Thereafter using modified Seldinger technique, transfemoral access into the right common femoral artery was obtained without difficulty. Over a 0.035 inch guidewire, a 5 French Pinnacle sheath was inserted. Through this, and also over 0.035 inch guidewire, a 5 [place] JB 1 catheter was advanced to the aortic arch region and selectively positioned in the right common carotid artery, right subclavian artery, the left common carotid artery and the left vertebral artery. A 3D rotational arteriogram was also performed of the right anterior circulation intracranially from a right common carotid artery injection. 3D reconstructions were then performed on a separate workstation. FINDINGS: The right subclavian arteriogram demonstrates the right vertebral artery origin to be widely patent. This is the hypoplastic  right vertebral artery which ascends to the cranial skull base. Faint opacification of the right posterior-inferior cerebellar artery and the right vertebrobasilar junction proximally is seen. The right common carotid arteriogram demonstrates the right external carotid artery and its major branches to be widely patent. The right internal carotid artery at the bulb to the cranial skull base opacifies normally. Patency is seen of the petrous, cavernous and supraclinoid segments. A right posterior communicating artery is seen opacifying the right posterior cerebral artery distribution. The right middle cerebral artery and transiently the right anterior cerebral artery opacify into the capillary and venous phases. Arising in the right internal carotid artery paraophthalmic region just distal to the origin of the right ophthalmic artery is a slight bilobed saccular aneurysm projecting superiorly. Similarly there is an aneurysm arising from the inferior division of the right middle cerebral artery proximally. 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 Also confirmed by the 3D rotational arteriogram is  an approximately 2.9 mm x 2.7 mm saccular outpouching from the proximal inferior division of the right middle cerebral artery. This, however, appears to have a vessel arising from the proximal fundus of the aneurysm. The left common carotid arteriogram demonstrates the left external carotid artery and its major branches to be widely patent. The left internal carotid artery proximally to the cranial skull base opacifies normally. The petrous, cavernous and supraclinoid segments demonstrate wide patency. The left middle cerebral artery and the left anterior cerebral artery opacify into the capillary and venous phases. There is prompt cross filling via the anterior communicating artery of the right anterior cerebral  artery A2 segment into the capillary and venous phases. The venous phase also demonstrates a focal area of severe attenuation of the caliber of the right transverse sinus. The dominant left vertebral artery origin is widely patent. The vessel is seen to opacify normally to the cranial skull base. Wide patency is seen of the left vertebrobasilar junction and the left posterior-inferior cerebellar artery. The basilar artery, the left posterior cerebral artery, the superior cerebellar arteries and the anterior-inferior cerebellar arteries opacify into the capillary and venous phases. 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 The angiographic findings were reviewed with the patient and the patient's family. 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 Again the endovascular option of treatment of the right paraophthalmic region aneurysm with a pipeline flow diverter device with periprocedural risk of thromboembolic stroke of approximately 1%, and the remote possibility of a delayed hemorrhage intracranially were all reviewed. 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 The patient was put under general anesthesia by the Department of Anesthesiology at [facility]. The 5 [place] 1 catheter in the right common carotid artery was then exchanged over a 0.035 inch 300 cm Constance Holster exchange guidewire for a 6 French 80 cm Cook shuttle sheath using biplane roadmap technique and constant fluoroscopic  guidance. Good aspiration obtained from the hub of the [facility] shuttle sheath. A gentle contrast injection demonstrated no evidence of spasms, dissections or of intraluminal filling defects. Over a 0.035 inch Roadrunner guidewire, using biplane roadmap technique and constant fluoroscopic guidance, a Navien 5 French 115 cm guide catheter was then advanced to the petrous cavernous segment of the right internal carotid artery. The guidewire was removed. Good aspiration obtained from the hub of the Navien guide catheter. A control arteriogram performed centered intracranially demonstrated no change in the intracranial or extracranial circulations. At this time, the [facility] shuttle sheath was advanced to the distal cervical right ICA. Over a 0.014 inch Softip Synchro micro guidewire, a Phenom 027 microcatheter was advanced to the distal 5 [place] Navien guide catheter. With the micro guidewire leading with a J-tip configuration, the combination was navigated without difficulty to the supraclinoid right ICA. Using a torque device, the micro guidewire was advanced to the M2 M3 region of the inferior division of the right middle cerebral artery followed by the microcatheter. The guidewire was removed. Good aspiration obtained from the hub of the microcatheter. A gentle contrast injection demonstrates safe positioning of tip of the microcatheter.  This was then connected to continuous heparinized saline infusion. Measurements had been performed of the right internal carotid artery, just distal and proximal to the aneurysm. It was decided to use a 125 x 16 mm pipeline flex flow diverter device. This was then advanced in a coaxial manner and with constant heparinized saline infusion using biplane roadmap technique and constant fluoroscopic guidance to the distal end of the microcatheter. The O ring on the delivery microcatheter was then loosened. The entire system was then straightened. With slight forward gentle traction with the  right hand on the delivery micro guidewire, with the left hand the delivery microcatheter was retrieved unsheathing the distal wire on the device, and the distal portion of the device itself. This was continued until there was a cigar configuration of the device distally. The system was then slightly retrieved more proximally with gentle to and fro motion of the microcatheter. This resulted in the complete opening of the distal device. The combination was then retrieved proximally to the landing zone distally of the device which was just proximal to the origin of the right posterior communicating artery. Once there, the micro guidewire was then advanced to deploy and deliver the device. During this time fluffing of the device was performed with the advancement and to and fro motion of the microcatheter. This resulted in good apposition with the native vessel. Also the tip of the microcatheter was maintained centralized within the native vessel. Once the entire device had been deployed, a control arteriogram performed through the 5 [place] Navien guide catheter in the petrous segment of the right internal carotid artery demonstrated excellent apposition with stasis within the aneurysm itself. Intracranially no evidence of intracranial filling defects were seen. The micro catheter was then advanced through the delivery device into the right MCA and the wire was captured into the microcatheter. The combination was then gently retrieved and removed ensuring no entanglement of the device. None was observed. Control arteriograms were then performed at 15,30 and 40 minutes post the deployment of the device. No intracranial filling defects or intra device irregularities were seen. The patient's ACT was maintained in the region of 200-210 seconds. Hemodynamically and neurologically the patient remained stable. No evidence of extravasation was noted either. The 5 [place] Navien guide catheter and the 6 [place] Cook shuttle  sheath were then retrieved into the abdominal aorta and exchanged over a J-tip guidewire for a 6 Pinnacle sheath. This in turn was then removed with the successful application of an Angio-Seal closure device. The right groin site appeared soft without evidence of a hematoma or bleeding. Distal pulses remained palpable in the dorsalis pedis, and posterior regions bilaterally at the end of the procedure unchanged from prior to the procedure. The patient's general anesthesia was then reversed and the patient was extubated without difficulty. Upon recovery the patient demonstrated no new neurological signs or symptoms. She denied any headaches. She did have mild nausea which responded to the treatment. The patient was then transferred to the neuro PACU and then to neuro ICU to continue on IV heparin overnight, and close monitoring of the patient's blood pressure. The patient was able to handle a clear liquid diet overnight. The following morning the IV heparin was stopped. Patient was switched to aspirin 81 mg a day, and Brilinta 90 mg b.i.d. The patient's P2Y12 was in the region of approximately 70. Apparently she continued to have a mild ooze from her groin tract side. She did not have any underlying hematoma, and pressure  was applied again with the patient maintained in a supine position for 4 more hours which resulted in the gradual stopping of the ooze. The distal pulses remained palpable bilaterally. The patient during the nighttime also had complained of left-sided chest pains. An EKG performed revealed no evidence of acute myocardial ischemia. A cardiology consult was obtained with recommendations to be followed. In view of the patient's slight ooze in the right groin puncture site, her discharge was delayed for another 12-24 hours in order to ensure complete hemostasis of the right puncture site. Towards the afternoon, the patient was able to maintain full p.o. intake. IMPRESSION: Status post endovascular  treatment of a right internal carotid artery intracranial paraophthalmic region using the pipeline flex flow diverter device as described above. PLAN: Following discharge, the patient will be seen in the Outpatient in approximately 2 weeks time. During this time, the patient will be encouraged to maintain adequate hydration, and to continue her medications especially the dual antiplatelets. Patient would also be instructed to refrain from stooping, bending or lifting weights above 10 pounds. She apparently has support at home during this time. INDICATION: Patient with a recent history of worsening headaches over the past few months involving the right parietooccipital regions. Past history of migraine headaches. 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 Electronically Signed   By: Luanne Bras M.D.   On: 11/24/2017 17:32   Ir Angio Intra Extracran Sel Internal Carotid Uni R Mod Sed  Result Date: 11/28/2017 INDICATION: Patient with a recent history of worsening headaches over the past few months involving the right parietooccipital regions. Past history of migraine headaches. 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 CLINICAL DATA:  Discovery of recent intracranial aneurysms as above. EXAM: TRANSCATHETER THERAPY EMBOLIZATION; IR NEURO EACH ADD'L AFTER BASIC UNI RIGHT (MS); IR ANGIO INTRA EXTRACRAN SEL COM CAROTID INNOMINATE UNI LEFT MOD SED; ARTERIOGRAPHY; IR ANGIO VERTEBRAL SEL VERTEBRAL UNI LEFT MOD SED; IR ANGIO INTRA EXTRACRAN SEL INTERNAL CAROTID UNI RIGHT MOD SED COMPARISON:  Recent MRA MRI of the brain. MEDICATIONS: Heparin 4,500 units IV; none antibiotic was administered within 1 hour of the procedure. ANESTHESIA/SEDATION: Mac anesthesia for the initial diagnostic portion and general anesthesia for the intervention  portion. CONTRAST:  Isovue 300 approximately 140 mL. FLUOROSCOPY TIME:  Fluoroscopy Time: 38 minutes 36 seconds (1461 mGy). COMPLICATIONS: None immediate. TECHNIQUE: Informed written consent was obtained from the patient after a thorough discussion of the procedural risks, benefits and alternatives. All questions were addressed. Maximal Sterile Barrier Technique was utilized including caps, mask, sterile gowns, sterile gloves, sterile drape, hand hygiene and skin antiseptic. A timeout was performed prior to the initiation of the procedure. The right groin was prepped and draped in the usual sterile fashion. Thereafter using modified Seldinger technique, transfemoral access into the right common femoral artery was obtained without difficulty. Over a 0.035 inch guidewire, a 5 French Pinnacle sheath was inserted. Through this, and also over 0.035 inch guidewire, a 5 [place] JB 1 catheter was advanced to the aortic arch region and selectively positioned in the right common carotid artery, right subclavian artery, the left common carotid artery and the left vertebral artery. A 3D rotational arteriogram was also performed of the right anterior circulation intracranially from a right common carotid artery injection. 3D reconstructions were then performed on a separate workstation. FINDINGS: The right subclavian arteriogram demonstrates the right vertebral artery  origin to be widely patent. This is the hypoplastic right vertebral artery which ascends to the cranial skull base. Faint opacification of the right posterior-inferior cerebellar artery and the right vertebrobasilar junction proximally is seen. The right common carotid arteriogram demonstrates the right external carotid artery and its major branches to be widely patent. The right internal carotid artery at the bulb to the cranial skull base opacifies normally. Patency is seen of the petrous, cavernous and supraclinoid segments. A right posterior communicating artery  is seen opacifying the right posterior cerebral artery distribution. The right middle cerebral artery and transiently the right anterior cerebral artery opacify into the capillary and venous phases. Arising in the right internal carotid artery paraophthalmic region just distal to the origin of the right ophthalmic artery is a slight bilobed saccular aneurysm projecting superiorly. Similarly there is an aneurysm arising from the inferior division of the right middle cerebral artery proximally. 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 Also confirmed by the 3D rotational arteriogram is an approximately 2.9 mm x 2.7 mm saccular outpouching from the proximal inferior division of the right middle cerebral artery. This, however, appears to have a vessel arising from the proximal fundus of the aneurysm. The left common carotid arteriogram demonstrates the left external carotid artery and its major branches to be widely patent. The left internal carotid artery proximally to the cranial skull base opacifies normally. The petrous, cavernous and supraclinoid segments demonstrate wide patency. The left middle cerebral artery and the left anterior cerebral artery opacify into the capillary and venous phases. There is prompt cross filling via the anterior communicating artery of the right anterior cerebral artery A2 segment into the capillary and venous phases. The venous phase also demonstrates a focal area of severe attenuation of the caliber of the right transverse sinus. The dominant left vertebral artery origin is widely patent. The vessel is seen to opacify normally to the cranial skull base. Wide patency is seen of the left vertebrobasilar junction and the left posterior-inferior cerebellar artery. The basilar artery, the left posterior cerebral artery, the superior cerebellar arteries and the anterior-inferior  cerebellar arteries opacify into the capillary and venous phases. 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 The angiographic findings were reviewed with the patient and the patient's family. 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 Again the endovascular option of treatment of the right paraophthalmic region aneurysm with a pipeline flow diverter device with periprocedural risk of thromboembolic stroke of approximately 1%, and the remote possibility of a delayed hemorrhage intracranially were all reviewed. 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 The patient was put under general anesthesia by the Department of Anesthesiology at [facility]. The 5 [place] 1 catheter in the right common carotid artery was then exchanged over a 0.035 inch 300 cm Constance Holster exchange guidewire for a 6 French 80 cm Cook shuttle sheath using biplane roadmap technique and constant fluoroscopic guidance. Good aspiration obtained from the hub of the [facility] shuttle sheath. A gentle contrast injection  demonstrated no evidence of spasms, dissections or of intraluminal filling defects. Over a 0.035 inch Roadrunner guidewire, using biplane roadmap technique and constant fluoroscopic guidance, a Navien 5 French 115 cm guide catheter was then advanced to the petrous cavernous segment of the right internal carotid artery. The guidewire was removed. Good aspiration obtained from the hub of the Navien guide catheter. A  control arteriogram performed centered intracranially demonstrated no change in the intracranial or extracranial circulations. At this time, the [facility] shuttle sheath was advanced to the distal cervical right ICA. Over a 0.014 inch Softip Synchro micro guidewire, a Phenom 027 microcatheter was advanced to the distal 5 [place] Navien guide catheter. With the micro guidewire leading with a J-tip configuration, the combination was navigated without difficulty to the supraclinoid right ICA. Using a torque device, the micro guidewire was advanced to the M2 M3 region of the inferior division of the right middle cerebral artery followed by the microcatheter. The guidewire was removed. Good aspiration obtained from the hub of the microcatheter. A gentle contrast injection demonstrates safe positioning of tip of the microcatheter. This was then connected to continuous heparinized saline infusion. Measurements had been performed of the right internal carotid artery, just distal and proximal to the aneurysm. It was decided to use a 125 x 16 mm pipeline flex flow diverter device. This was then advanced in a coaxial manner and with constant heparinized saline infusion using biplane roadmap technique and constant fluoroscopic guidance to the distal end of the microcatheter. The O ring on the delivery microcatheter was then loosened. The entire system was then straightened. With slight forward gentle traction with the right hand on the delivery micro guidewire, with the left hand the delivery microcatheter was retrieved unsheathing the distal wire on the device, and the distal portion of the device itself. This was continued until there was a cigar configuration of the device distally. The system was then slightly retrieved more proximally with gentle to and fro motion of the microcatheter. This resulted in the complete opening of the distal device. The combination was then retrieved proximally to the landing zone distally of the  device which was just proximal to the origin of the right posterior communicating artery. Once there, the micro guidewire was then advanced to deploy and deliver the device. During this time fluffing of the device was performed with the advancement and to and fro motion of the microcatheter. This resulted in good apposition with the native vessel. Also the tip of the microcatheter was maintained centralized within the native vessel. Once the entire device had been deployed, a control arteriogram performed through the 5 [place] Navien guide catheter in the petrous segment of the right internal carotid artery demonstrated excellent apposition with stasis within the aneurysm itself. Intracranially no evidence of intracranial filling defects were seen. The micro catheter was then advanced through the delivery device into the right MCA and the wire was captured into the microcatheter. The combination was then gently retrieved and removed ensuring no entanglement of the device. None was observed. Control arteriograms were then performed at 15,30 and 40 minutes post the deployment of the device. No intracranial filling defects or intra device irregularities were seen. The patient's ACT was maintained in the region of 200-210 seconds. Hemodynamically and neurologically the patient remained stable. No evidence of extravasation was noted either. The 5 [place] Navien guide catheter and the 6 [place] Cook shuttle sheath were then retrieved into the abdominal aorta and exchanged over a J-tip guidewire for a  6 Pinnacle sheath. This in turn was then removed with the successful application of an Angio-Seal closure device. The right groin site appeared soft without evidence of a hematoma or bleeding. Distal pulses remained palpable in the dorsalis pedis, and posterior regions bilaterally at the end of the procedure unchanged from prior to the procedure. The patient's general anesthesia was then reversed and the patient was extubated  without difficulty. Upon recovery the patient demonstrated no new neurological signs or symptoms. She denied any headaches. She did have mild nausea which responded to the treatment. The patient was then transferred to the neuro PACU and then to neuro ICU to continue on IV heparin overnight, and close monitoring of the patient's blood pressure. The patient was able to handle a clear liquid diet overnight. The following morning the IV heparin was stopped. Patient was switched to aspirin 81 mg a day, and Brilinta 90 mg b.i.d. The patient's P2Y12 was in the region of approximately 70. Apparently she continued to have a mild ooze from her groin tract side. She did not have any underlying hematoma, and pressure was applied again with the patient maintained in a supine position for 4 more hours which resulted in the gradual stopping of the ooze. The distal pulses remained palpable bilaterally. The patient during the nighttime also had complained of left-sided chest pains. An EKG performed revealed no evidence of acute myocardial ischemia. A cardiology consult was obtained with recommendations to be followed. In view of the patient's slight ooze in the right groin puncture site, her discharge was delayed for another 12-24 hours in order to ensure complete hemostasis of the right puncture site. Towards the afternoon, the patient was able to maintain full p.o. intake. 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 PLAN: Following discharge, the patient will be seen in the Outpatient in approximately 2 weeks time. During this time, the patient will be encouraged to maintain adequate hydration, and to continue her medications especially the dual antiplatelets. Patient would also be instructed to refrain from stooping, bending or lifting weights above 10 pounds. She apparently has support at  home during this time. INDICATION: Patient with a recent history of worsening headaches over the past few months involving the right parietooccipital regions. Past history of migraine headaches. 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 Electronically Signed   By: Luanne Bras M.D.   On: 11/24/2017 17:32   Ir Angio Vertebral Sel Subclavian Innominate Uni R Mod Sed  Result Date: 11/28/2017 INDICATION: Patient with a recent history of worsening headaches over the past few months involving the right parietooccipital regions. Past history of migraine headaches. 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 CLINICAL DATA:  Discovery of recent intracranial aneurysms as above. EXAM: TRANSCATHETER THERAPY EMBOLIZATION; IR NEURO EACH ADD'L AFTER BASIC UNI RIGHT (MS); IR ANGIO INTRA EXTRACRAN SEL COM CAROTID INNOMINATE UNI LEFT MOD SED; ARTERIOGRAPHY; IR ANGIO VERTEBRAL SEL VERTEBRAL UNI LEFT MOD SED; IR ANGIO INTRA EXTRACRAN SEL INTERNAL CAROTID UNI RIGHT MOD SED COMPARISON:  Recent MRA MRI of the brain. MEDICATIONS: Heparin 4,500 units IV; none antibiotic was administered within 1 hour of the procedure. ANESTHESIA/SEDATION: Mac anesthesia for the initial diagnostic portion and general anesthesia for the intervention portion. CONTRAST:  Isovue 300 approximately 140 mL. FLUOROSCOPY TIME:  Fluoroscopy Time: 44  minutes 36 seconds (1461 mGy). COMPLICATIONS: None immediate. TECHNIQUE: Informed written consent was obtained from the patient after a thorough discussion of the procedural risks, benefits and alternatives. All questions were addressed. Maximal Sterile Barrier Technique was utilized including caps, mask, sterile gowns, sterile gloves, sterile drape, hand hygiene and skin antiseptic. A timeout was performed prior to the initiation of the  procedure. The right groin was prepped and draped in the usual sterile fashion. Thereafter using modified Seldinger technique, transfemoral access into the right common femoral artery was obtained without difficulty. Over a 0.035 inch guidewire, a 5 French Pinnacle sheath was inserted. Through this, and also over 0.035 inch guidewire, a 5 [place] JB 1 catheter was advanced to the aortic arch region and selectively positioned in the right common carotid artery, right subclavian artery, the left common carotid artery and the left vertebral artery. A 3D rotational arteriogram was also performed of the right anterior circulation intracranially from a right common carotid artery injection. 3D reconstructions were then performed on a separate workstation. FINDINGS: The right subclavian arteriogram demonstrates the right vertebral artery origin to be widely patent. This is the hypoplastic right vertebral artery which ascends to the cranial skull base. Faint opacification of the right posterior-inferior cerebellar artery and the right vertebrobasilar junction proximally is seen. The right common carotid arteriogram demonstrates the right external carotid artery and its major branches to be widely patent. The right internal carotid artery at the bulb to the cranial skull base opacifies normally. Patency is seen of the petrous, cavernous and supraclinoid segments. A right posterior communicating artery is seen opacifying the right posterior cerebral artery distribution. The right middle cerebral artery and transiently the right anterior cerebral artery opacify into the capillary and venous phases. Arising in the right internal carotid artery paraophthalmic region just distal to the origin of the right ophthalmic artery is a slight bilobed saccular aneurysm projecting superiorly. Similarly there is an aneurysm arising from the inferior division of the right middle cerebral artery proximally. The 3D rotational reconstruction  confirmed the presence of an approximately 3.5 mm x 3 mm wide neck right internal carotid artery paraophthalmic aneurysm. Also confirmed by the 3D rotational arteriogram is an approximately 2.9 mm x 2.7 mm saccular outpouching from the proximal inferior division of the right middle cerebral artery. This, however, appears to have a vessel arising from the proximal fundus of the aneurysm. The left common carotid arteriogram demonstrates the left external carotid artery and its major branches to be widely patent. The left internal carotid artery proximally to the cranial skull base opacifies normally. The petrous, cavernous and supraclinoid segments demonstrate wide patency. The left middle cerebral artery and the left anterior cerebral artery opacify into the capillary and venous phases. There is prompt cross filling via the anterior communicating artery of the right anterior cerebral artery A2 segment into the capillary and venous phases. The venous phase also demonstrates a focal area of severe attenuation of the caliber of the right transverse sinus. The dominant left vertebral artery origin is widely patent. The vessel is seen to opacify normally to the cranial skull base. Wide patency is seen of the left vertebrobasilar junction and the left posterior-inferior cerebellar artery. The basilar artery, the left posterior cerebral artery, the superior cerebellar arteries and the anterior-inferior cerebellar arteries opacify into the capillary and venous phases. 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 The angiographic findings were reviewed with the patient and the patient's  family. 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 Again given was the option of continued medical surveillance with MRA of the brain of the 2 aneurysms,  versus consideration of endovascular treatment of the right internal carotid artery paraophthalmic region aneurysm. Again the endovascular option of treatment of the right paraophthalmic region aneurysm with a pipeline flow diverter device with periprocedural risk of thromboembolic stroke of approximately 1%, and the remote possibility of a delayed hemorrhage intracranially were all reviewed. 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 The patient was put under general anesthesia by the Department of Anesthesiology at [facility]. The 5 [place] 1 catheter in the right common carotid artery was then exchanged over a 0.035 inch 300 cm Constance Holster exchange guidewire for a 6 French 80 cm Cook shuttle sheath using biplane roadmap technique and constant fluoroscopic guidance. Good aspiration obtained from the hub of the [facility] shuttle sheath. A gentle contrast injection demonstrated no evidence of spasms, dissections or of intraluminal filling defects. Over a 0.035 inch Roadrunner guidewire, using biplane roadmap technique and constant fluoroscopic guidance, a Navien 5 French 115 cm guide catheter was then advanced to the petrous cavernous segment of the right internal carotid artery. The guidewire was removed. Good aspiration obtained from the hub of the Navien guide catheter. A control arteriogram performed centered intracranially demonstrated no change in the intracranial or extracranial circulations. At this time, the [facility] shuttle sheath was advanced to the distal cervical right ICA. Over a 0.014 inch Softip Synchro micro guidewire, a Phenom 027 microcatheter was advanced to the distal 5 [place] Navien guide catheter. With the micro guidewire leading with a J-tip configuration, the combination was navigated without difficulty to the supraclinoid right ICA. Using a torque device, the micro  guidewire was advanced to the M2 M3 region of the inferior division of the right middle cerebral artery followed by the microcatheter. The guidewire was removed. Good aspiration obtained from the hub of the microcatheter. A gentle contrast injection demonstrates safe positioning of tip of the microcatheter. This was then connected to continuous heparinized saline infusion. Measurements had been performed of the right internal carotid artery, just distal and proximal to the aneurysm. It was decided to use a 125 x 16 mm pipeline flex flow diverter device. This was then advanced in a coaxial manner and with constant heparinized saline infusion using biplane roadmap technique and constant fluoroscopic guidance to the distal end of the microcatheter. The O ring on the delivery microcatheter was then loosened. The entire system was then straightened. With slight forward gentle traction with the right hand on the delivery micro guidewire, with the left hand the delivery microcatheter was retrieved unsheathing the distal wire on the device, and the distal portion of the device itself. This was continued until there was a cigar configuration of the device distally. The system was then slightly retrieved more proximally with gentle to and fro motion of the microcatheter. This resulted in the complete opening of the distal device. The combination was then retrieved proximally to the landing zone distally of the device which was just proximal to the origin of the right posterior communicating artery. Once there, the micro guidewire was then advanced to deploy and deliver the device. During this time fluffing of the device was performed with the advancement and to and fro motion of the microcatheter.  This resulted in good apposition with the native vessel. Also the tip of the microcatheter was maintained centralized within the native vessel. Once the entire device had been deployed, a control arteriogram performed through the 5  [place] Navien guide catheter in the petrous segment of the right internal carotid artery demonstrated excellent apposition with stasis within the aneurysm itself. Intracranially no evidence of intracranial filling defects were seen. The micro catheter was then advanced through the delivery device into the right MCA and the wire was captured into the microcatheter. The combination was then gently retrieved and removed ensuring no entanglement of the device. None was observed. Control arteriograms were then performed at 15,30 and 40 minutes post the deployment of the device. No intracranial filling defects or intra device irregularities were seen. The patient's ACT was maintained in the region of 200-210 seconds. Hemodynamically and neurologically the patient remained stable. No evidence of extravasation was noted either. The 5 [place] Navien guide catheter and the 6 [place] Cook shuttle sheath were then retrieved into the abdominal aorta and exchanged over a J-tip guidewire for a 6 Pinnacle sheath. This in turn was then removed with the successful application of an Angio-Seal closure device. The right groin site appeared soft without evidence of a hematoma or bleeding. Distal pulses remained palpable in the dorsalis pedis, and posterior regions bilaterally at the end of the procedure unchanged from prior to the procedure. The patient's general anesthesia was then reversed and the patient was extubated without difficulty. Upon recovery the patient demonstrated no new neurological signs or symptoms. She denied any headaches. She did have mild nausea which responded to the treatment. The patient was then transferred to the neuro PACU and then to neuro ICU to continue on IV heparin overnight, and close monitoring of the patient's blood pressure. The patient was able to handle a clear liquid diet overnight. The following morning the IV heparin was stopped. Patient was switched to aspirin 81 mg a day, and Brilinta 90 mg  b.i.d. The patient's P2Y12 was in the region of approximately 70. Apparently she continued to have a mild ooze from her groin tract side. She did not have any underlying hematoma, and pressure was applied again with the patient maintained in a supine position for 4 more hours which resulted in the gradual stopping of the ooze. The distal pulses remained palpable bilaterally. The patient during the nighttime also had complained of left-sided chest pains. An EKG performed revealed no evidence of acute myocardial ischemia. A cardiology consult was obtained with recommendations to be followed. In view of the patient's slight ooze in the right groin puncture site, her discharge was delayed for another 12-24 hours in order to ensure complete hemostasis of the right puncture site. Towards the afternoon, the patient was able to maintain full p.o. intake. 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 PLAN: Following discharge, the patient will be seen in the Outpatient in approximately 2 weeks time. During this time, the patient will be encouraged to maintain adequate hydration, and to continue her medications especially the dual antiplatelets. Patient would also be instructed to refrain from stooping, bending or lifting weights above 10 pounds. She apparently has support at home during this time. INDICATION: Patient with a recent history of worsening headaches over the past few months involving the right parietooccipital regions. Past history of migraine headaches. 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 Electronically Signed   By: Luanne Bras M.D.   On: 11/24/2017 17:32   Ir Angio Vertebral Sel Vertebral Uni L Mod Sed  Result Date: 11/28/2017 INDICATION: Patient with a recent history of  worsening headaches over the past few months involving the right parietooccipital regions. Past history of migraine headaches. 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 CLINICAL DATA:  Discovery of recent intracranial aneurysms as above. EXAM: TRANSCATHETER THERAPY EMBOLIZATION; IR NEURO EACH ADD'L AFTER BASIC UNI RIGHT (MS); IR ANGIO INTRA EXTRACRAN SEL COM CAROTID INNOMINATE UNI LEFT MOD SED; ARTERIOGRAPHY; IR ANGIO VERTEBRAL SEL VERTEBRAL UNI LEFT MOD SED; IR ANGIO INTRA EXTRACRAN SEL INTERNAL CAROTID UNI RIGHT MOD SED COMPARISON:  Recent MRA MRI of the brain. MEDICATIONS: Heparin 4,500 units IV; none antibiotic was administered within 1 hour of the procedure. ANESTHESIA/SEDATION: Mac anesthesia for the initial diagnostic portion and general anesthesia for the intervention portion. CONTRAST:  Isovue 300 approximately 140 mL. FLUOROSCOPY TIME:  Fluoroscopy Time: 38 minutes 36 seconds (1461 mGy). COMPLICATIONS: None immediate. TECHNIQUE: Informed written consent was obtained from the patient after a thorough discussion of the procedural risks, benefits and alternatives. All questions were addressed. Maximal Sterile Barrier Technique was utilized including caps, mask, sterile gowns, sterile gloves, sterile drape, hand hygiene and skin antiseptic. A timeout was performed prior to the initiation of the procedure. The right groin was prepped and draped in the usual sterile fashion. Thereafter using modified Seldinger technique, transfemoral access into the right common femoral artery was obtained without difficulty. Over a 0.035 inch guidewire, a 5 French Pinnacle sheath was inserted. Through this, and also over 0.035 inch guidewire, a 5 [place] JB 1 catheter was advanced to the aortic arch region and selectively positioned in the right common carotid artery, right subclavian artery, the left common carotid artery and the left  vertebral artery. A 3D rotational arteriogram was also performed of the right anterior circulation intracranially from a right common carotid artery injection. 3D reconstructions were then performed on a separate workstation. FINDINGS: The right subclavian arteriogram demonstrates the right vertebral artery origin to be widely patent. This is the hypoplastic right vertebral artery which ascends to the cranial skull base. Faint opacification of the right posterior-inferior cerebellar artery and the right vertebrobasilar junction proximally is seen. The right common carotid arteriogram demonstrates the right external carotid artery and its major branches to be widely patent. The right internal carotid artery at the bulb to the cranial skull base opacifies normally. Patency is seen of the petrous, cavernous and supraclinoid segments. A right posterior communicating artery is seen opacifying the right posterior cerebral artery distribution. The right middle cerebral artery and transiently the right anterior cerebral artery opacify into the capillary and venous phases. Arising in the right internal carotid artery paraophthalmic region just distal to the origin of the right ophthalmic artery is a slight bilobed saccular aneurysm projecting superiorly. Similarly there is an aneurysm arising from the inferior division of the right middle cerebral artery proximally. 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 Also confirmed by the 3D rotational arteriogram is an approximately 2.9 mm x 2.7 mm saccular outpouching from the proximal inferior division of the right middle cerebral artery. This, however, appears to have a vessel arising from the proximal fundus of the aneurysm. The left common carotid arteriogram  demonstrates the left external carotid artery and its major branches to be widely patent. The left internal  carotid artery proximally to the cranial skull base opacifies normally. The petrous, cavernous and supraclinoid segments demonstrate wide patency. The left middle cerebral artery and the left anterior cerebral artery opacify into the capillary and venous phases. There is prompt cross filling via the anterior communicating artery of the right anterior cerebral artery A2 segment into the capillary and venous phases. The venous phase also demonstrates a focal area of severe attenuation of the caliber of the right transverse sinus. The dominant left vertebral artery origin is widely patent. The vessel is seen to opacify normally to the cranial skull base. Wide patency is seen of the left vertebrobasilar junction and the left posterior-inferior cerebellar artery. The basilar artery, the left posterior cerebral artery, the superior cerebellar arteries and the anterior-inferior cerebellar arteries opacify into the capillary and venous phases. 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 The angiographic findings were reviewed with the patient and the patient's family. 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 Again the endovascular option of treatment of the right paraophthalmic region aneurysm with a pipeline flow diverter device with periprocedural risk of thromboembolic stroke of approximately 1%, and the remote possibility of a delayed hemorrhage intracranially were all reviewed. The patient again expressed her understanding and wanting to proceed with endovascular treatment of the right internal carotid artery  paraophthalmic region aneurysm. The patient was put under general anesthesia by the Department of Anesthesiology at [facility]. The 5 [place] 1 catheter in the right common carotid artery was then exchanged over a 0.035 inch 300 cm Constance Holster exchange guidewire for a 6 French 80 cm Cook shuttle sheath using biplane roadmap technique and constant fluoroscopic guidance. Good aspiration obtained from the hub of the [facility] shuttle sheath. A gentle contrast injection demonstrated no evidence of spasms, dissections or of intraluminal filling defects. Over a 0.035 inch Roadrunner guidewire, using biplane roadmap technique and constant fluoroscopic guidance, a Navien 5 French 115 cm guide catheter was then advanced to the petrous cavernous segment of the right internal carotid artery. The guidewire was removed. Good aspiration obtained from the hub of the Navien guide catheter. A control arteriogram performed centered intracranially demonstrated no change in the intracranial or extracranial circulations. At this time, the [facility] shuttle sheath was advanced to the distal cervical right ICA. Over a 0.014 inch Softip Synchro micro guidewire, a Phenom 027 microcatheter was advanced to the distal 5 [place] Navien guide catheter. With the micro guidewire leading with a J-tip configuration, the combination was navigated without difficulty to the supraclinoid right ICA. Using a torque device, the micro guidewire was advanced to the M2 M3 region of the inferior division of the right middle cerebral artery followed by the microcatheter. The guidewire was removed. Good aspiration obtained from the hub of the microcatheter. A gentle contrast injection demonstrates safe positioning of tip of the microcatheter. This was then connected to continuous heparinized saline infusion. Measurements had been performed of the right internal carotid artery, just distal and proximal to the aneurysm. It was decided to use a 125 x 16 mm pipeline flex  flow  diverter device. This was then advanced in a coaxial manner and with constant heparinized saline infusion using biplane roadmap technique and constant fluoroscopic guidance to the distal end of the microcatheter. The O ring on the delivery microcatheter was then loosened. The entire system was then straightened. With slight forward gentle traction with the right hand on the delivery micro guidewire, with the left hand the delivery microcatheter was retrieved unsheathing the distal wire on the device, and the distal portion of the device itself. This was continued until there was a cigar configuration of the device distally. The system was then slightly retrieved more proximally with gentle to and fro motion of the microcatheter. This resulted in the complete opening of the distal device. The combination was then retrieved proximally to the landing zone distally of the device which was just proximal to the origin of the right posterior communicating artery. Once there, the micro guidewire was then advanced to deploy and deliver the device. During this time fluffing of the device was performed with the advancement and to and fro motion of the microcatheter. This resulted in good apposition with the native vessel. Also the tip of the microcatheter was maintained centralized within the native vessel. Once the entire device had been deployed, a control arteriogram performed through the 5 [place] Navien guide catheter in the petrous segment of the right internal carotid artery demonstrated excellent apposition with stasis within the aneurysm itself. Intracranially no evidence of intracranial filling defects were seen. The micro catheter was then advanced through the delivery device into the right MCA and the wire was captured into the microcatheter. The combination was then gently retrieved and removed ensuring no entanglement of the device. None was observed. Control arteriograms were then performed at 15,30 and 40  minutes post the deployment of the device. No intracranial filling defects or intra device irregularities were seen. The patient's ACT was maintained in the region of 200-210 seconds. Hemodynamically and neurologically the patient remained stable. No evidence of extravasation was noted either. The 5 [place] Navien guide catheter and the 6 [place] Cook shuttle sheath were then retrieved into the abdominal aorta and exchanged over a J-tip guidewire for a 6 Pinnacle sheath. This in turn was then removed with the successful application of an Angio-Seal closure device. The right groin site appeared soft without evidence of a hematoma or bleeding. Distal pulses remained palpable in the dorsalis pedis, and posterior regions bilaterally at the end of the procedure unchanged from prior to the procedure. The patient's general anesthesia was then reversed and the patient was extubated without difficulty. Upon recovery the patient demonstrated no new neurological signs or symptoms. She denied any headaches. She did have mild nausea which responded to the treatment. The patient was then transferred to the neuro PACU and then to neuro ICU to continue on IV heparin overnight, and close monitoring of the patient's blood pressure. The patient was able to handle a clear liquid diet overnight. The following morning the IV heparin was stopped. Patient was switched to aspirin 81 mg a day, and Brilinta 90 mg b.i.d. The patient's P2Y12 was in the region of approximately 70. Apparently she continued to have a mild ooze from her groin tract side. She did not have any underlying hematoma, and pressure was applied again with the patient maintained in a supine position for 4 more hours which resulted in the gradual stopping of the ooze. The distal pulses remained palpable bilaterally. The patient during the nighttime also had complained of left-sided  chest pains. An EKG performed revealed no evidence of acute myocardial ischemia. A cardiology  consult was obtained with recommendations to be followed. In view of the patient's slight ooze in the right groin puncture site, her discharge was delayed for another 12-24 hours in order to ensure complete hemostasis of the right puncture site. Towards the afternoon, the patient was able to maintain full p.o. intake. 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 PLAN: Following discharge, the patient will be seen in the Outpatient in approximately 2 weeks time. During this time, the patient will be encouraged to maintain adequate hydration, and to continue her medications especially the dual antiplatelets. Patient would also be instructed to refrain from stooping, bending or lifting weights above 10 pounds. She apparently has support at home during this time. INDICATION: Patient with a recent history of worsening headaches over the past few months involving the right parietooccipital regions. Past history of migraine headaches. 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 Electronically Signed   By: Luanne Bras M.D.   On: 11/24/2017 17:32    Labs:  CBC: Recent Labs    11/23/17 0655 11/24/17 0544 11/29/17 2129  WBC 7.1 7.9 11.8*  HGB 12.9 10.2* 9.6*  HCT 39.0 31.2* 31.7*  PLT 235 198 243    COAGS: Recent Labs    11/23/17 0655 11/29/17 2129  INR 0.91 0.88    BMP: Recent Labs    11/23/17 0655 11/24/17 0544 11/25/17 1728 11/29/17 2129  NA 144 142  --  140  K 3.4* 3.3* 3.7 4.3  CL 107 109  --  104  CO2 26 28  --  29  GLUCOSE 97 98  --  79  BUN 21* 12  --  14  CALCIUM 8.9 8.1*  --  9.1  CREATININE 0.73 0.65  --  0.67  GFRNONAA >60 >60  --  >60  GFRAA >60 >60  --  >60    LIVER FUNCTION TESTS: Recent Labs    11/29/17 2129  BILITOT 0.4  AST 17  ALT  14  ALKPHOS 37*  PROT 5.8*  ALBUMIN 3.2*    TUMOR MARKERS: No results for input(s): AFPTM, CEA, CA199, CHROMGRNA in the last 8760 hours.  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 Estanislado Pandy. Reviewed imaging with patient and sister. Explained that patient's aneurysm is stable at this time, and the best management is with routine imaging scans to monitor for changes. Plan for follow-up MRI/MRA brain/head (w/o contrast) in 4 months. Informed patient that our schedulers will call her to set up this imaging scan. Instructed patient to continue aking Brilinta 90 mg once every other day and Aspirin 81 mg once daily.  Right groin incision suspicious for hematoma and right lower extremity suspicious for DVT based on physical exam.  Will order groin and lower extremity ultrasound STAT.  Attempted to call PCP to notify, but was not able to get a hold of anyone on phone after waiting 5 minutes. Spoke with ultrasound tech in vascular lab who gave [place] preliminary results. States patient is negative for DVT or pseudoaneurysm, but has a resolving hematoma. Instructed patient to see her PCP tomorrow for management of hematoma.  Instructed patient no stooping, bending, or lifting more than 10 pounds. Instructed patient not to drive herself. Instructed patient to drink plenty  of water.  All questions answered and concerns addressed. Patient and sister convey understanding and agree with plan.  Thank you for this interesting consult.  I greatly enjoyed meeting Romell Cavanah and look forward to participating in their care.  A copy of this report was sent to the requesting provider on this date.  Electronically Signed: Earley Abide, PA-C 12/11/2017, 10:04 AM   I spent a total of 40 Minutes in face to face in clinical consultation, greater than 50% of which was counseling/coordinating care for right ICA periophthalmic aneurysm s/p embolization.

## 2017-12-13 ENCOUNTER — Telehealth (HOSPITAL_COMMUNITY): Payer: Self-pay

## 2017-12-13 NOTE — Telephone Encounter (Signed)
Hailey from pt's PCP called and wanted to know why they needed to see pt for f/u. I informed them that pt had been seen in f/u since her tx. She had complications with her groin swelling. She has had 3 different ultrasound to check for pseudoaneurysm and it has been ruled out. She has a hematoma and was told to f/u with her PCP. Hailey understood and they got the pt in at 3pm today for f/u. She was just confused because the pt didn't get them much information.

## 2017-12-13 NOTE — Telephone Encounter (Signed)
Pt called complaining of her R leg swelling. She came for a consult on Monday 7/1 and had an ultrasound done to r/o pseudoaneurysm. Pt was advised that she had a hematoma and was put on certain restrictions by Dr. Corliss Skainseveshwar. No bending, lifting, stooping until it heals. She was also advised to go see her PCP to f/u. I asked pt if she had seen her PCP and she stated that she had not. Her leg is swollen down to her knee and the lump on her groin has increased in size. I informed pt that she would need to either come back to the ED if her leg is that bad or that she really needed to call her PCP to get in with him since she hadn't done so. I called Ally our PA and she agreed that the pt needs to f/u with PCP. I advised pt and she agreed to call PCP today and schedule an appt.

## 2017-12-15 ENCOUNTER — Ambulatory Visit (HOSPITAL_COMMUNITY)
Admission: RE | Admit: 2017-12-15 | Discharge: 2017-12-15 | Disposition: A | Discharge status: Home / Self Care | Payer: Medicare PPO | Source: Ambulatory Visit | Attending: Cardiology | Admitting: Cardiology

## 2017-12-15 ENCOUNTER — Encounter (HOSPITAL_COMMUNITY): Payer: Self-pay

## 2017-12-15 DIAGNOSIS — R079 Chest pain, unspecified: Secondary | ICD-10-CM

## 2017-12-15 DIAGNOSIS — I2510 Atherosclerotic heart disease of native coronary artery without angina pectoris: Secondary | ICD-10-CM | POA: Insufficient documentation

## 2017-12-15 DIAGNOSIS — Z79899 Other long term (current) drug therapy: Secondary | ICD-10-CM | POA: Diagnosis not present

## 2017-12-15 MED ORDER — METOPROLOL TARTRATE 50 MG PO TABS
100.0000 mg | ORAL_TABLET | Freq: Once | ORAL | Status: AC
  Administered 2017-12-15: 100 mg via ORAL
  Filled 2017-12-15: qty 2

## 2017-12-15 MED ORDER — IOPAMIDOL (ISOVUE-370) INJECTION 76%
INTRAVENOUS | Status: AC
  Administered 2017-12-15: 80 mL
  Filled 2017-12-15: qty 100

## 2017-12-15 MED ORDER — METOPROLOL TARTRATE 50 MG PO TABS
ORAL_TABLET | ORAL | Status: AC
  Filled 2017-12-15: qty 2

## 2017-12-15 MED ORDER — NITROGLYCERIN 0.4 MG SL SUBL
0.4000 mg | SUBLINGUAL_TABLET | Freq: Once | SUBLINGUAL | Status: AC
  Administered 2017-12-15: 0.4 mg via SUBLINGUAL

## 2017-12-15 MED ORDER — NITROGLYCERIN 0.4 MG SL SUBL
SUBLINGUAL_TABLET | SUBLINGUAL | Status: AC
  Administered 2017-12-15: 0.4 mg via SUBLINGUAL
  Filled 2017-12-15: qty 1

## 2017-12-15 NOTE — Progress Notes (Signed)
Patient does not know why she is having this test. States chest pain on order and patient aware of this. Dr Jacinto HalimGanji called and explained reason for test to patient and patient satisfied now.

## 2017-12-21 NOTE — Telephone Encounter (Signed)
Attempted phone call

## 2017-12-26 ENCOUNTER — Telehealth (HOSPITAL_COMMUNITY): Payer: Self-pay

## 2017-12-26 NOTE — Telephone Encounter (Signed)
Returned pt's call, no answer, left vm. AW  

## 2018-02-09 ENCOUNTER — Telehealth (HOSPITAL_COMMUNITY): Payer: Self-pay

## 2018-02-09 NOTE — Telephone Encounter (Signed)
Returned pt's call. Left message for her to come in for p2y12 and to call back when she gets messge. AW

## 2018-05-03 ENCOUNTER — Telehealth (HOSPITAL_COMMUNITY): Payer: Self-pay | Admitting: Radiology

## 2018-05-03 ENCOUNTER — Other Ambulatory Visit (HOSPITAL_COMMUNITY): Payer: Self-pay | Admitting: Interventional Radiology

## 2018-05-03 DIAGNOSIS — I671 Cerebral aneurysm, nonruptured: Secondary | ICD-10-CM

## 2018-05-03 NOTE — Telephone Encounter (Signed)
Pt called complaining of sx. She states that she continues to have extreme dizzy spells, visual changes and loss at times, nausea, and vomiting. She would like to see Deveshwar ASAP. Discussed that her insurance MRA but requested peer to peer for MRI. Per Deveshwar, if this is the case then request authorization for a cerebral angiogram. Patient would like to know why. I am scheduling her for a consult with Dr. Corliss Skainseveshwar. She is in agreement with this plan. I will also forward this information to the PA. JM

## 2018-05-04 ENCOUNTER — Ambulatory Visit (HOSPITAL_COMMUNITY)
Admission: RE | Admit: 2018-05-04 | Discharge: 2018-05-04 | Disposition: A | Discharge status: Home / Self Care | Payer: Medicare PPO | Source: Ambulatory Visit | Attending: Interventional Radiology | Admitting: Interventional Radiology

## 2018-05-04 DIAGNOSIS — I671 Cerebral aneurysm, nonruptured: Secondary | ICD-10-CM

## 2018-05-04 NOTE — Progress Notes (Signed)
Chief Complaint: Patient was seen in consultation today for right ICA paraophthalmic aneurysm s/p embolization.  Supervising Physician: Luanne Bras  Patient Status: [facility] - Out-pt  History of Present Illness: Kara Ochoa is a 57 y.o. female with a past medical history of TIA 2011, CVA 2016 and 2018, GERD, fibromyalgia, OA, arthritis, anxiety, depression, and current tobacco use. She is known to [facility] and has been followed by Dr. Estanislado Pandy since 11/09/2017. She was first referred to [place] for management of right ICA paraophthalmic aneurysm. She first consulted with Dr. Estanislado Pandy 11/09/2017 to discuss management options. At that time, patient decided to pursue endovascular intervention for her aneurysm. She underwent an image-guided diagnostic cerebral angiogram with embolization of her right ICA paraophthalmic aneurysm using pipeline flow diverter 11/23/2017 by Dr. Estanislado Pandy. At her two week follow-up appointment 12/11/2017, it was decided that patient undergo MRI/MRA in 6 months. However, she called our office 05/03/2018 with complaints of extreme dizziness spells, visual changes/loss, and N/V.  Patient presents today to discuss new symptoms. Patient awake and alert sitting in chair. Complains of "dizzy spells". States that she was constantly dizzy for 5-6 weeks until 2 weeks ago when her "spells" became intermittent. States they occur about every other day and last hours. Describes "dizzy spells" as a spinning feeling "like a ride at the fair". States she gets very nauseous and vomits from dizziness. States she feels "off balance" when walking during "dizzy spells". Denies syncope or vision changes associated with dizziness. Complains of migraines. States migraines occur a few times per week (about 3 times per week) and the last 12 hours or more ("all day and into the next day"). States that her migraines also cause severe pain in her left eye and it "feels like my eye is going to fall out of my  head". In addition, she has vision changes of bilateral blurred vision and complete blindness, alternating between the two. States that she "ties a leather band around her head" and takes Tylenol, goody powder, phenergan, and sleeps for migraines. Denies N/V associated with migraines. Denies weakness, numbness/tingling, diplopia, hearing changes, tinnitus, or speech difficulty.  Patient is currently taking Brilinta 90 mg once every other day and Aspirin 81 mg once daily. States that she has recently ran out of Brilinta (she has 1 pill left).   Past Medical History:  Diagnosis Date  . Allergy history unknown   . Aneurysm ([place])   . Anxiety   . Arthritis   . Chest pain    Pressure  . Constipation   . CVA (cerebral vascular accident) ([place])    2016, 2018  . Depression   . Family history of adverse reaction to anesthesia    pt had mother had PONV, difficulty waking up  . Fatigue   . Fibromyalgia   . GERD (gastroesophageal reflux disease)   . Headache(784.0)   . Kidney stone   . Lightheadedness   . Migraines   . Mini stroke ([place]) 2011  . OA (osteoarthritis)   . PONV (postoperative nausea and vomiting)   . SOB (shortness of breath)     Past Surgical History:  Procedure Laterality Date  . ABDOMINAL HYSTERECTOMY  2000  . BREAST CYST EXCISION     lanced   . CARDIOVASCULAR STRESS TEST  2011   with IV   . COLONOSCOPY W/ BIOPSIES AND POLYPECTOMY    . HEMORROIDECTOMY    . IR 3D INDEPENDENT WKST  11/23/2017  . IR ANGIO INTRA EXTRACRAN SEL COM CAROTID INNOMINATE UNI  L MOD SED  11/23/2017  . IR ANGIO INTRA EXTRACRAN SEL INTERNAL CAROTID UNI R MOD SED  11/23/2017  . IR ANGIO VERTEBRAL SEL SUBCLAVIAN INNOMINATE UNI R MOD SED  11/23/2017  . IR ANGIO VERTEBRAL SEL VERTEBRAL UNI L MOD SED  11/23/2017  . IR ANGIOGRAM FOLLOW UP STUDY  11/23/2017  . IR RADIOLOGIST EVAL & MGMT  11/09/2017  . IR TRANSCATH/EMBOLIZ  11/23/2017  . RADIOLOGY WITH ANESTHESIA N/A 11/23/2017   Procedure: EMBOLIZATION;   Surgeon: Luanne Bras, MD;  Location: [place];  Service: Radiology;  Laterality: N/A;    Allergies: Bee venom; Plavix [clopidogrel bisulfate]; Sausage [pickled meat]; Sulfa antibiotics; Sulfa drugs cross reactors; Zonisamide; Codeine; Penicillins; and Hydrocodone  Medications: Prior to Admission medications   Medication Sig Start Date End Date Taking? Authorizing Provider  aspirin EC 81 MG tablet Take 81 mg by mouth daily.    [provider]  Calcium Carbonate-Vit D-Min (CALCIUM 1200) 1200-1000 MG-UNIT CHEW Chew 1 tablet by mouth daily.    [provider]  Cholecalciferol (VITAMIN D PO) Take 5,000 Units by mouth daily.     [provider]  Cyanocobalamin (B-12) 1000 MCG/ML KIT Inject 1 mL as directed every 30 (thirty) days.    [provider]  divalproex (DEPAKOTE) 250 MG DR tablet Take 250-500 mg by mouth See admin instructions. 1 tablet in the morning, 2 tablets in the evening     [provider]  EPINEPHrine (EPIPEN 2-PAK) 0.3 mg/0.3 mL IJ SOAJ injection Inject 0.3 mg into the muscle daily as needed (allergic reaction).     [provider]  gabapentin (NEURONTIN) 100 MG capsule Take 100 mg by mouth 3 (three) times daily.    [provider]  Lactobacillus (ACIDOPHILUS PO) Take 1 capsule by mouth every evening. Probiotic 10 Billion +    [provider]  LORazepam (ATIVAN) 1 MG tablet Take 0.5-1 mg by mouth 2 (two) times daily.     [provider]  Misc Natural Products (APPLE CIDER VINEGAR DIET PO) Take 1 tablet by mouth daily.    [provider]  omeprazole (PRILOSEC) 40 MG capsule Take 40 mg by mouth 2 (two) times daily.    [provider]  Oxycodone HCl 10 MG TABS Take 10 mg by mouth every 8 (eight) hours as needed (pain).     [provider]  promethazine (PHENERGAN) 25 MG tablet Take 25 mg by mouth every 6 (six) hours as needed for nausea or vomiting.    [provider]    ticagrelor (BRILINTA) 90 MG TABS tablet Take 90 mg by mouth every other day.     [provider]  venlafaxine XR (EFFEXOR-XR) 150 MG 24 hr capsule Take 150 mg by mouth at bedtime.     [provider]  vitamin B-12 (CYANOCOBALAMIN) 1000 MCG tablet Take 1,000 mcg by mouth daily.    [provider]  vitamin E 400 UNIT capsule Take 400 Units by mouth daily.    [provider]     Family History  Problem Relation Age of Onset  . Cancer Mother   . Depression Mother   . Valvular heart disease Mother        mitral valve reflux  . Other Father        degenerative bone   . Colon cancer Maternal Grandmother   . Stroke Maternal Grandfather   . Thyroid disease Daughter   . Basal cell carcinoma Daughter     Social  History   Socioeconomic History  . Marital status: Legally Separated    Spouse name: Not on file  . Number of children: 4  . Years of education: 14  . Highest education level: Not on file  Occupational History  . Occupation: Hotel manager: [employer]  . Financial resource strain: Not on file  . Food insecurity:    Worry: Not on file    Inability: Not on file  . Transportation needs:    Medical: Not on file    Non-medical: Not on file  Tobacco Use  . Smoking status: Current Every Day Smoker    Packs/day: 0.25    Years: 38.00    Pack years: 9.50    Types: Cigarettes  . Smokeless tobacco: Never Used  . Tobacco comment: approx 10 packs every 3 weeks  Substance and Sexual Activity  . Alcohol use: No  . 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 Sexual activity: Not on file  Lifestyle  . Physical activity:    Days per week: Not on file    Minutes per session: Not on file  . Stress: Not on file  Relationships  . Social connections:    Talks on phone: Not on file    Gets together: Not on file    Attends religious service: Not on file    Active member of club or organization: Not on file     Attends meetings of clubs or organizations: Not on file    Relationship status: Not on file  Other Topics Concern  . Not on file  Social History Narrative  . Not on file     Review of Systems: A 12 point ROS discussed and pertinent positives are indicated in the HPI above.  All other systems are negative.  Review of Systems  Constitutional: Negative for chills and fever.  HENT: Negative for hearing loss and tinnitus.   Eyes: Positive for visual disturbance.  Respiratory: Negative for shortness of breath and wheezing.   Cardiovascular: Negative for chest pain and palpitations.  Gastrointestinal: Positive for nausea and vomiting.  Neurological: Positive for dizziness and headaches. Negative for syncope, speech difficulty, weakness and numbness.  Psychiatric/Behavioral: Negative for behavioral problems and confusion.    Vital Signs: There were no vitals taken for this visit.  Physical Exam  Constitutional: She is oriented to person, place, and time. She appears well-developed and well-nourished. No distress.  Pulmonary/Chest: Effort normal. No respiratory distress.  Neurological: She is alert and oriented to person, place, and time.  Skin: Skin is warm and dry.  Psychiatric: She has a normal mood and affect. Her behavior is normal. Judgment and thought content normal.     Imaging: No results found.  Labs:  CBC: Recent Labs    11/23/17 0655 11/24/17 0544 11/29/17 2129  WBC 7.1 7.9 11.8*  HGB 12.9 10.2* 9.6*  HCT 39.0 31.2* 31.7*  PLT 235 198 243    COAGS: Recent Labs    11/23/17 0655 11/29/17 2129  INR 0.91 0.88    BMP: Recent Labs    11/23/17 0655 11/24/17 0544 11/25/17 1728 11/29/17 2129  NA 144 142  --  140  K 3.4* 3.3* 3.7 4.3  CL 107 109  --  104  CO2 26 28  --  29  GLUCOSE 97 98  --  79  BUN 21* 12  --  14  CALCIUM 8.9 8.1*  --  9.1  CREATININE 0.73 0.65  --  0.67  GFRNONAA >60 >60  --  >60  GFRAA >60 >60  --  >60    LIVER FUNCTION  TESTS: Recent Labs    11/29/17 2129  BILITOT 0.4  AST 17  ALT 14  ALKPHOS 37*  PROT 5.8*  ALBUMIN 3.2*    TUMOR MARKERS: No results for input(s): AFPTM, CEA, CA199, CHROMGRNA in the last 8760 hours.  Assessment and Plan:  Right ICA paraophthalmic aneurysm s/p embolization using pipeline flow diverter 11/23/2017 by Dr. Estanislado Pandy. Reviewed imaging with patient. Brought to her attention was her right ICA paraophthalmic aneurysm, both prior to and following treatment. Explained that this aneurysm is not the cause of her symptoms. Explained that the best course of management at this time would be to obtain imaging scans due to new onset of symptoms. If they are negative for acute abnormality, recommend patient follow-up with PCP for referral to neurologist for migraines and referral to ENT for dizziness (as this may be relatedto middle/inner ear issues).  Patient asks about management regarding "my other aneurysm you found". Patient was found to have a right MCA trifurcation aneurysm on her diagnostic cerebral angiogram 11/23/2017. Informed patient that we will revisit treating this aneurysm after her dizziness/migraines are under control.  Discussed patient's tobacco use. Patient states that she smokes 1/3 PPD. Strongly advised patient to quit smoking.  Plan for follow-up MRI/MRA brain/head (without contrast) ASAP. Informed patient that our schedulers will call her to set up this imaging scan. Instructed patient to continue taking Aspirin 81 mg once daily. Instructed patient to discontinue taking Brilinta 90 mg once every other day, and begin taking Brilinta 45 mg once daily. Samples of Brilinta given to patient. Informed patient that we will revisit her medication use following MRI/MRA results.  All questions answered and concerns addressed. Patient conveys understanding and agrees with plan.  Thank you for this interesting consult.  I greatly enjoyed meeting Kara Ochoa and look forward  to participating in their care.  A copy of this report was sent to the requesting provider on this date.  Electronically Signed: Earley Abide, PA-C 05/04/2018, 9:58 AM   I spent a total of 40 Minutes in face to face in clinical consultation, greater than 50% of which was counseling/coordinating care for right ICA paraophthalmic aneurysm s/p embolization.

## 2018-05-08 ENCOUNTER — Ambulatory Visit (HOSPITAL_COMMUNITY): Payer: Medicare PPO

## 2018-05-08 ENCOUNTER — Other Ambulatory Visit (HOSPITAL_COMMUNITY): Payer: Self-pay | Admitting: Interventional Radiology

## 2018-05-18 ENCOUNTER — Telehealth: Payer: Self-pay | Admitting: Student

## 2018-05-18 NOTE — Telephone Encounter (Signed)
Received message from patient regarding symptoms.  Called patient at [phone]. Patient states that she has had 4 nosebleeds in the past few weeks. States she thinks it is related to her Brilinta use. Complains of generalized fatigue. States she has "no energy" and "all I do is sleep". Also complains of constant headaches. States she has dizziness and occasional N/V with headaches, but denies vision changes. States there is no change in her headaches but she "needs relief".  Discussed case with Dr. Corliss Skainseveshwar.  Called patient at [phone]. Informed her that because of her nose bleeds, per Dr. Corliss Skainseveshwar, we are going to alter her medications. Instructed patient to discontinue taking Brilinta 45 mg once daily and Aspirin 81 mg once daily. Instructed patient to begin taking Brilinta 45 mg once every other day and Aspirin 81 mg once every other day, alternating between the two so that only one medication is taken per day. Informed patient, per Dr. Corliss Skainseveshwar, that her generalized fatigue and headaches are not related to her aneurysms. Recommended patient follow-up with PCP regarding generalized fatigue/headaches.  All questions answered and concerns addressed. Patient conveys understanding and agrees with plan.  Waylan Bogalexandra M Mandrell Vangilder, PA-C 05/18/2018, 3:17 PM

## 2018-05-21 ENCOUNTER — Emergency Department (HOSPITAL_COMMUNITY)
Admission: EM | Admit: 2018-05-21 | Discharge: 2018-05-21 | Disposition: A | Discharge status: Home / Self Care | Payer: Medicare PPO | Attending: Emergency Medicine | Admitting: Emergency Medicine

## 2018-05-21 ENCOUNTER — Other Ambulatory Visit (HOSPITAL_COMMUNITY): Payer: Self-pay | Admitting: Interventional Radiology

## 2018-05-21 ENCOUNTER — Telehealth (HOSPITAL_COMMUNITY): Payer: Self-pay | Admitting: Radiology

## 2018-05-21 ENCOUNTER — Ambulatory Visit (HOSPITAL_COMMUNITY)
Admission: RE | Admit: 2018-05-21 | Discharge: 2018-05-21 | Disposition: A | Discharge status: Home / Self Care | Payer: Medicare PPO | Source: Ambulatory Visit | Attending: Interventional Radiology | Admitting: Interventional Radiology

## 2018-05-21 DIAGNOSIS — I671 Cerebral aneurysm, nonruptured: Secondary | ICD-10-CM | POA: Insufficient documentation

## 2018-05-21 DIAGNOSIS — R42 Dizziness and giddiness: Secondary | ICD-10-CM | POA: Diagnosis present

## 2018-05-21 DIAGNOSIS — F17210 Nicotine dependence, cigarettes, uncomplicated: Secondary | ICD-10-CM | POA: Insufficient documentation

## 2018-05-21 DIAGNOSIS — Z8679 Personal history of other diseases of the circulatory system: Secondary | ICD-10-CM | POA: Insufficient documentation

## 2018-05-21 DIAGNOSIS — R51 Headache: Secondary | ICD-10-CM | POA: Diagnosis not present

## 2018-05-21 DIAGNOSIS — R519 Headache, unspecified: Secondary | ICD-10-CM

## 2018-05-21 DIAGNOSIS — Z79899 Other long term (current) drug therapy: Secondary | ICD-10-CM | POA: Insufficient documentation

## 2018-05-21 DIAGNOSIS — Z7982 Long term (current) use of aspirin: Secondary | ICD-10-CM | POA: Diagnosis not present

## 2018-05-21 MED ORDER — PROMETHAZINE HCL 25 MG/ML IJ SOLN
12.5000 mg | Freq: Once | INTRAMUSCULAR | Status: AC
  Administered 2018-05-21: 12.5 mg via INTRAVENOUS
  Filled 2018-05-21: qty 1

## 2018-05-21 MED ORDER — DEXAMETHASONE SODIUM PHOSPHATE 10 MG/ML IJ SOLN
10.0000 mg | Freq: Once | INTRAMUSCULAR | Status: AC
  Administered 2018-05-21: 10 mg via INTRAVENOUS
  Filled 2018-05-21: qty 1

## 2018-05-21 MED ORDER — MECLIZINE HCL 25 MG PO TABS
25.0000 mg | ORAL_TABLET | Freq: Once | ORAL | Status: AC
  Administered 2018-05-21: 25 mg via ORAL
  Filled 2018-05-21: qty 1

## 2018-05-21 MED ORDER — DIPHENHYDRAMINE HCL 50 MG/ML IJ SOLN
25.0000 mg | Freq: Once | INTRAMUSCULAR | Status: AC
  Administered 2018-05-21: 25 mg via INTRAVENOUS
  Filled 2018-05-21: qty 1

## 2018-05-21 MED ORDER — MECLIZINE HCL 25 MG PO TABS: 25.0000 mg | ORAL_TABLET | Freq: Three times a day (TID) | ORAL | 0 refills | Status: AC | PRN

## 2018-05-21 MED ORDER — SODIUM CHLORIDE 0.9 % IV BOLUS
1000.0000 mL | Freq: Once | INTRAVENOUS | Status: AC
  Administered 2018-05-21: 1000 mL via INTRAVENOUS

## 2018-05-21 NOTE — ED Triage Notes (Signed)
Pt was brought from MRI after having a MRI of brian pt had MRI today due to dizziness and headaches for over 1 month.

## 2018-05-21 NOTE — ED Provider Notes (Signed)
[facility] EMERGENCY DEPARTMENT Provider Note   CSN: [id number] Arrival date & time: 05/21/18  1552     History   Chief Complaint Chief Complaint  Patient presents with  . Dizziness    HPI Kamri Gotsch is a 57 y.o. female.  Patient sent over from MRI with a long-standing problem.  Patient previously had intervention for aneurysm.  Patient's been having persistent and recurrent problems with vertigo and headaches.  She drove up from [place] today to have a follow-up MRI/MRA and to address any concerns of things being worse.  That study did not show anything significant.  Patient followed by Dr. Bubba Camp.  Patient also has a history of fibromyalgia.  Patient had cerebrovascular accidents in 2016 and 2018.  She has had intervention on the aneurysm.  But all that is stable.  Patient drove from [place] they did not feel comfortable with her driving back so she was sent to the emergency department.  Patient states that normally her symptoms are improved with Antivert and migraine cocktail.  Patient has family members coming to get her to get her car back and to get her back to [facility].     Past Medical History:  Diagnosis Date  . Allergy history unknown   . Aneurysm ([place])   . Anxiety   . Arthritis   . Chest pain    Pressure  . Constipation   . CVA (cerebral vascular accident) ([place])    2016, 2018  . Depression   . Family history of adverse reaction to anesthesia    pt had mother had PONV, difficulty waking up  . Fatigue   . Fibromyalgia   . GERD (gastroesophageal reflux disease)   . Headache(784.0)   . Kidney stone   . Lightheadedness   . Migraines   . Mini stroke ([place]) 2011  . OA (osteoarthritis)   . PONV (postoperative nausea and vomiting)   . SOB (shortness of breath)     Patient Active Problem List   Diagnosis Date Noted  . Brain aneurysm 11/23/2017  . Chest pain 12/07/2016  . Palpitations 12/07/2016  . Shortness of breath 12/07/2016  .  Murmur 12/07/2016  . Leg edema 06/30/2011    Past Surgical History:  Procedure Laterality Date  . ABDOMINAL HYSTERECTOMY  2000  . BREAST CYST EXCISION     lanced   . CARDIOVASCULAR STRESS TEST  2011   with IV   . COLONOSCOPY W/ BIOPSIES AND POLYPECTOMY    . HEMORROIDECTOMY    . IR 3D INDEPENDENT WKST  11/23/2017  . IR ANGIO INTRA EXTRACRAN SEL COM CAROTID INNOMINATE UNI L MOD SED  11/23/2017  . IR ANGIO INTRA EXTRACRAN SEL INTERNAL CAROTID UNI R MOD SED  11/23/2017  . IR ANGIO VERTEBRAL SEL SUBCLAVIAN INNOMINATE UNI R MOD SED  11/23/2017  . IR ANGIO VERTEBRAL SEL VERTEBRAL UNI L MOD SED  11/23/2017  . IR ANGIOGRAM FOLLOW UP STUDY  11/23/2017  . IR RADIOLOGIST EVAL & MGMT  11/09/2017  . IR TRANSCATH/EMBOLIZ  11/23/2017  . RADIOLOGY WITH ANESTHESIA N/A 11/23/2017   Procedure: EMBOLIZATION;  Surgeon: Luanne Bras, MD;  Location: [facility];  Service: Radiology;  Laterality: N/A;     OB History   None      Home Medications    Prior to Admission medications   Medication Sig Start Date End Date Taking? Authorizing Provider  aspirin EC 81 MG tablet Take 81 mg by mouth every other day.    Yes [provider]  Calcium Carbonate-Vit D-Min (CALCIUM 1200) 1200-1000 MG-UNIT CHEW Chew 1 tablet by mouth daily.   Yes [provider]  Cholecalciferol (VITAMIN D PO) Take 5,000 Units by mouth daily.    Yes [provider]  Cyanocobalamin (B-12) 1000 MCG/ML KIT Inject 1 mL as directed every 30 (thirty) days.   Yes [provider]  DULoxetine (CYMBALTA) 60 MG capsule Take 60 mg by mouth daily. 05/14/18  Yes [provider]  EPINEPHrine (EPIPEN 2-PAK) 0.3 mg/0.3 mL IJ SOAJ injection Inject 0.3 mg into the muscle daily as needed (allergic reaction).    Yes [provider]  gabapentin (NEURONTIN) 100 MG capsule Take 100 mg by mouth 3 (three) times daily.   Yes [provider]  Lactobacillus (ACIDOPHILUS PO) Take 1 capsule by mouth every evening.  Probiotic 10 Billion +   Yes [provider]  LORazepam (ATIVAN) 1 MG tablet Take 0.5-1 mg by mouth 2 (two) times daily.    Yes [provider]  meclizine (ANTIVERT) 25 MG tablet Take 25 mg by mouth 2 (two) times daily as needed for dizziness. 04/06/18  Yes [provider]  Misc Natural Products (APPLE CIDER VINEGAR DIET PO) Take 1 tablet by mouth daily.   Yes [provider]  omeprazole (PRILOSEC) 40 MG capsule Take 40 mg by mouth 2 (two) times daily.   Yes [provider]  oxyCODONE (ROXICODONE) 15 MG immediate release tablet Take 15 mg by mouth every 8 (eight) hours as needed for severe pain. 04/24/18  Yes [provider]  promethazine (PHENERGAN) 25 MG tablet Take 25 mg by mouth every 6 (six) hours as needed for nausea or vomiting.   Yes [provider]  ticagrelor (BRILINTA) 90 MG TABS tablet Take 90 mg by mouth every other day.    Yes [provider]  vitamin B-12 (CYANOCOBALAMIN) 1000 MCG tablet Take 1,000 mcg by mouth daily.   Yes [provider]  vitamin E 400 UNIT capsule Take 400 Units by mouth daily.   Yes [provider]  cyclobenzaprine (FLEXERIL) 10 MG tablet Take 10 mg by mouth 2 (two) times daily as needed for muscle spasms. 05/08/18   [provider]  meclizine (ANTIVERT) 25 MG tablet Take 1 tablet (25 mg total) by mouth 3 (three) times daily as needed for dizziness. 05/21/18   Fredia Sorrow, MD    Family History Family History  Problem Relation Age of Onset  . Cancer Mother   . Depression Mother   . Valvular heart disease Mother        mitral valve reflux  . Other Father        degenerative bone   . Colon cancer Maternal Grandmother   . Stroke Maternal Grandfather   . Thyroid disease Daughter   . Basal cell carcinoma Daughter     Social History Social History   Tobacco Use  . Smoking status: Current Every Day Smoker    Packs/day: 0.25    Years: 38.00    Pack  years: 9.50    Types: Cigarettes  . Smokeless tobacco: Never Used  . Tobacco comment: approx 10 packs every 3 weeks  Substance Use Topics  . Alcohol use: No  . 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  HENT: Negative for congestion.   Eyes: Negative for redness and visual disturbance.  Respiratory: Negative for shortness of breath.   Cardiovascular: Negative for chest pain.  Gastrointestinal: Negative for abdominal pain.  Musculoskeletal: Negative for back pain.  Skin: Negative for rash.  Neurological: Positive for dizziness and headaches.  Hematological: Does not bruise/bleed easily.  Psychiatric/Behavioral: Negative for confusion.     Physical Exam Updated Vital Signs BP 132/84   Pulse 72   Temp 98.8 F (37.1 C) (Oral)   Resp 16   SpO2 98%   Physical Exam  Constitutional: She is oriented to person, place, and time. She appears well-developed and well-nourished. No distress.  HENT:  Head: Normocephalic and atraumatic.  Mouth/Throat: Oropharynx is clear and moist.  Eyes: Pupils are equal, round, and reactive to light. EOM are normal.  Neck: Neck supple.  Cardiovascular: Normal rate and regular rhythm.  Pulmonary/Chest: Effort normal and breath sounds normal. No respiratory distress.  Abdominal: Soft. Bowel sounds are normal. There is no tenderness.  Neurological: She is alert and oriented to person, place, and time. No cranial nerve deficit.  Skin: Skin is warm.  Nursing note and vitals reviewed.    ED Treatments / Results  Labs (all labs ordered are listed, but only abnormal results are displayed) Labs Reviewed - No data to display  EKG None  Radiology Mr [facility] Wo Contrast  Result Date:  05/21/2018 CLINICAL DATA:  Dizziness and headaches. 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 EXAM: MRI HEAD WITHOUT CONTRAST MRA HEAD WITHOUT CONTRAST TECHNIQUE: Multiplanar, multiecho pulse sequences of the brain and surrounding structures were obtained without intravenous contrast. Angiographic images of the head were obtained using MRA technique without contrast. COMPARISON:  Cerebral angiogram 11/23/2017. Head MRI/MRA 10/11/2017. FINDINGS: MRI HEAD FINDINGS Brain: There is no evidence of acute infarct, intracranial hemorrhage, mass, midline shift, or extra-axial fluid collection. The ventricles and sulci are normal. The brain is normal in signal. A partially empty sella is unchanged. Vascular: Diminutive and poorly visualized distal right vertebral artery. Other major intracranial vascular flow voids are preserved. Skull and upper cervical spine: Unremarkable bone marrow signal. Sinuses/Orbits: Unremarkable orbits. Paranasal sinuses and mastoid air cells are clear. Other: None. MRA HEAD FINDINGS The visualized distal vertebral arteries are patent with the left being strongly dominant and supplying the basilar. The right vertebral artery is small and terminates in PICA. Patent PICA and SCA origins are visualized bilaterally. The basilar artery is widely patent. There is a large right posterior communicating artery with diminutive right P1 segment. Both PCAs are patent without evidence of significant stenosis. The internal carotid arteries are patent from skull base to carotid termini. A pipeline stent extends from the right cavernous ICA into the proximal supraclinoid segment with some artifactually reduced flow related enhancement. The right paraophthalmic aneurysm is smaller than on the prior MRA although there is the suggestion of a 1.5 mm residual focus of flow related enhancement in the aneurysm base (series 4, image 79). ACAs and MCAs are patent  without evidence of proximal branch occlusion or significant proximal stenosis. A 3 x 2 mm aneurysm projecting inferiorly from the right MCA in the distal M1 or proximal M2 region is unchanged from the prior angiogram. IMPRESSION: 1. Unchanged brain MRI.  No acute intracranial abnormality. 2. Decreased size of right ICA paraophthalmic aneurysm status post pipeline stent placement. Suggestion of approximately 1.5 mm focus of residual flow in the  aneurysm base. 3. Unchanged 3 x 2 mm right MCA aneurysm. 4. Electronically Signed   By: Logan Bores M.D.   On: 05/21/2018 16:47   Mr Brain Wo Contrast  Result Date: 05/21/2018 CLINICAL DATA:  Dizziness and headaches. 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 EXAM: MRI HEAD WITHOUT CONTRAST MRA HEAD WITHOUT CONTRAST TECHNIQUE: Multiplanar, multiecho pulse sequences of the brain and surrounding structures were obtained without intravenous contrast. Angiographic images of the head were obtained using MRA technique without contrast. COMPARISON:  Cerebral angiogram 11/23/2017. Head MRI/MRA 10/11/2017. FINDINGS: MRI HEAD FINDINGS Brain: There is no evidence of acute infarct, intracranial hemorrhage, mass, midline shift, or extra-axial fluid collection. The ventricles and sulci are normal. The brain is normal in signal. A partially empty sella is unchanged. Vascular: Diminutive and poorly visualized distal right vertebral artery. Other major intracranial vascular flow voids are preserved. Skull and upper cervical spine: Unremarkable bone marrow signal. Sinuses/Orbits: Unremarkable orbits. Paranasal sinuses and mastoid air cells are clear. Other: None. MRA HEAD FINDINGS The visualized distal vertebral arteries are patent with the left being strongly dominant and supplying the basilar. The right vertebral artery is small and terminates in PICA. Patent PICA and SCA origins are visualized bilaterally. The basilar artery is  widely patent. There is a large right posterior communicating artery with diminutive right P1 segment. Both PCAs are patent without evidence of significant stenosis. The internal carotid arteries are patent from skull base to carotid termini. A pipeline stent extends from the right cavernous ICA into the proximal supraclinoid segment with some artifactually reduced flow related enhancement. The right paraophthalmic aneurysm is smaller than on the prior MRA although there is the suggestion of a 1.5 mm residual focus of flow related enhancement in the aneurysm base (series 4, image 79). ACAs and MCAs are patent without evidence of proximal branch occlusion or significant proximal stenosis. A 3 x 2 mm aneurysm projecting inferiorly from the right MCA in the distal M1 or proximal M2 region is unchanged from the prior angiogram. IMPRESSION: 1. Unchanged brain MRI.  No acute intracranial abnormality. 2. Decreased size of right ICA paraophthalmic aneurysm status post pipeline stent placement. Suggestion of approximately 1.5 mm focus of residual flow in the aneurysm base. 3. Unchanged 3 x 2 mm right MCA aneurysm. 4. Electronically Signed   By: Logan Bores M.D.   On: 05/21/2018 16:47    Procedures Procedures (including critical care time)  Medications Ordered in ED Medications  meclizine (ANTIVERT) tablet 25 mg (25 mg Oral Given 05/21/18 1958)  sodium chloride 0.9 % bolus 1,000 mL (0 mLs Intravenous Stopped 05/21/18 2114)  diphenhydrAMINE (BENADRYL) injection 25 mg (25 mg Intravenous Given 05/21/18 2011)  dexamethasone (DECADRON) injection 10 mg (10 mg Intravenous Given 05/21/18 2011)  promethazine (PHENERGAN) injection 12.5 mg (12.5 mg Intravenous Given 05/21/18 2011)     Initial Impression / Assessment and Plan / ED Course  I have reviewed the triage vital signs and the nursing notes.  Pertinent labs & imaging results that were available during my care of the patient were reviewed by me and considered in my  medical decision making (see chart for details).      Patient referred over from MR.  Patient followed by Dr. Juliane Poot wire drove up from [facility] for follow-up evaluation of aneurysm that had intervention.  Patient's had complaint of late vertigo and worse headaches.  However MRI showed nothing acute.  Actually showed some signs of  improvement.  They were concerned about her driving back to [place] on her own.  So she was referred to the ED.  Here we treated her with migraine cocktail and gave her Antivert with improvement however she does now have family members here that can drive her vehicle back and drive her back.  Patient will follow up with her doctors.  New prescription for Antivert provided.   Final Clinical Impressions(s) / ED Diagnoses   Final diagnoses:  Dizziness  Vertigo  Headache disorder    ED Discharge Orders         Ordered    meclizine (ANTIVERT) 25 MG tablet  3 times daily PRN     05/21/18 2202           Fredia Sorrow, MD 05/21/18 2212

## 2018-05-21 NOTE — Telephone Encounter (Signed)
Called pt to schedule MRI/MRA. No answer and VM is full. JM

## 2018-05-21 NOTE — Discharge Instructions (Signed)
Follow-up with your doctors.  MRI MRA without any acute changes to the aneurysm or any acute findings.  New prescription for Antivert provided.  In case you need additional meds.

## 2018-05-21 NOTE — ED Notes (Signed)
Pt ambulated to RR and back to room. Pt stated she wanted me to walk with her incase she gets dizzy again. Pt did well ambulating pt stated she was a little she stated she just felt unbalance . Like she just have a really bad headache

## 2018-05-21 NOTE — ED Notes (Signed)
ED Provider at bedside. 

## 2018-05-23 MED ORDER — OMEPRAZOLE 40 MG CAPSULE,DELAYED RELEASE
ORAL_CAPSULE | 2 refills | 0 days | Status: CP
Start: 2018-05-23 — End: ?

## 2018-05-25 ENCOUNTER — Telehealth (HOSPITAL_COMMUNITY): Payer: Self-pay

## 2018-05-25 NOTE — Telephone Encounter (Signed)
Called pt regarding recent mri, no answer, vm full. AW

## 2018-05-25 NOTE — Telephone Encounter (Signed)
Pt agreed to f/u in 6 months with mri/mra. AW  

## 2018-05-28 ENCOUNTER — Telehealth: Payer: Self-pay | Admitting: Student

## 2018-05-28 NOTE — Progress Notes (Signed)
Received message from patient regarding questions.  Called patient at [phone], no answer. Re-called patient at [phone].   Patient asks if Dr. Corliss Skainseveshwar is planning on treating her other aneurysm. Explained that at this time, our plan is to monitor both of her aneurysms with routine imaging scans in 6 months. Explained that the next plan of action is based on these results. Informed patient that our schedulers will call her to set up this imaging scan.  Patient asks if she is to continue taking her Brilinta. If so, states that she is out of this medication. Instructed patient that she is to continue taking Brilinta 45 mg once every other day and to continue taking Aspirin 81 mg once every other day, alternating between the two so that only one medication is taken per day. Confirmed pharmacy with patient- [facility] [place], [place] [phone]). Called pharmacy at 1152 to fill prescription- Brilinta 90 mg tablets, take one half tablet by mouth once every other day, dispense 10 tablets with 3 refills.  Patient asks about flying with her aneurysms. Discussed case with Dr. Corliss Skainseveshwar. Informed patient that there is no evidence of flying/not flying with brain aneurysms. Informed patient that because of this, it is up to her if she would like to fly. Instructed patient that if she does, make sure to stay hydrated by drinking plenty of water.  All questions answered and concerns addressed. Patient conveys understanding and agrees with plan.  Waylan Bogalexandra M Louk, PA-C 05/28/2018, 12:01 PM

## 2018-07-27 ENCOUNTER — Telehealth: Payer: Self-pay | Admitting: Student

## 2018-07-27 NOTE — Telephone Encounter (Signed)
Received call from patient regarding medication refills.  Called [facility] in [place], [place] [phone]) at 1050 to fill prescription- Brilinta 90 mg tablets, take one half tablet by mouth once every other day, dispense 10 tablets with 3 refills.  Waylan Boga Louk, PA-C 07/27/2018, 11:59 AM

## 2019-01-14 ENCOUNTER — Telehealth (HOSPITAL_COMMUNITY): Payer: Self-pay

## 2019-01-14 NOTE — Telephone Encounter (Signed)
Called to schedule mri, no answer, no vm. AW 

## 2019-01-16 DIAGNOSIS — I471 Supraventricular tachycardia, unspecified: Secondary | ICD-10-CM

## 2019-01-16 HISTORY — DX: Supraventricular tachycardia, unspecified: I47.10

## 2019-01-16 HISTORY — DX: Supraventricular tachycardia: I47.1

## 2019-02-05 ENCOUNTER — Encounter: Payer: Self-pay | Admitting: Family

## 2019-02-05 DIAGNOSIS — I639 Cerebral infarction, unspecified: Secondary | ICD-10-CM | POA: Insufficient documentation

## 2019-02-05 DIAGNOSIS — R0602 Shortness of breath: Secondary | ICD-10-CM | POA: Insufficient documentation

## 2019-02-05 DIAGNOSIS — G43909 Migraine, unspecified, not intractable, without status migrainosus: Secondary | ICD-10-CM | POA: Insufficient documentation

## 2019-02-05 NOTE — Progress Notes (Signed)
Cardiology Clinic Note   Patient Name: Kara Ochoa Date of Encounter: 02/06/2019  Primary Care Provider:  Irven Shelling, MD Primary Cardiologist:  Berniece Salines, DO  Chief Complaint    Kara Ochoa is a 58 y.o. female with a hx of chest pain, HTN, multiple TIA and one CVA, tobacco use, depression, anxiety, fibromyalgia, chronic headaches, constipation, OA . Presents today for follow up after ED visit with SVT.   Past Medical History    Past Medical History:  Diagnosis Date  . Allergy history unknown   . Aneurysm ([place])   . Anxiety   . Arthritis   . Chest pain    Pressure  . Constipation   . CVA (cerebral vascular accident) ([place])    2016, 2018  . Depression   . Family history of adverse reaction to anesthesia    pt had mother had PONV, difficulty waking up  . Fatigue   . Fibromyalgia   . GERD (gastroesophageal reflux disease)   . Headache(784.0)   . Kidney stone   . Lightheadedness   . Migraines   . Mini stroke ([place]) 2011  . OA (osteoarthritis)   . PONV (postoperative nausea and vomiting)   . Primary generalized (osteo)arthritis   . Reflex neuropathic bladder, not elsewhere classified   . SOB (shortness of breath)   . SVT (supraventricular tachycardia) ([place]) 01/16/2019   (a) 01/16/19 ED visit [facility] after witnessed SVT at PCP office rate 200-220   Past Surgical History:  Procedure Laterality Date  . BRAIN SURGERY  11/2017  . BREAST CYST EXCISION     lanced   . CARDIOVASCULAR STRESS TEST  2011   with IV   . COLONOSCOPY W/ BIOPSIES AND POLYPECTOMY    . HEMORROIDECTOMY    . IR 3D INDEPENDENT WKST  11/23/2017  . IR ANGIO INTRA EXTRACRAN SEL COM CAROTID INNOMINATE UNI L MOD SED  11/23/2017  . IR ANGIO INTRA EXTRACRAN SEL INTERNAL CAROTID UNI R MOD SED  11/23/2017  . IR ANGIO VERTEBRAL SEL SUBCLAVIAN INNOMINATE UNI R MOD SED  11/23/2017  . IR ANGIO VERTEBRAL SEL VERTEBRAL UNI L MOD SED  11/23/2017  . IR ANGIOGRAM FOLLOW UP STUDY  11/23/2017  . IR RADIOLOGIST EVAL &  MGMT  11/09/2017  . IR TRANSCATH/EMBOLIZ  11/23/2017  . KIDNEY STONE SURGERY    . RADIOLOGY WITH ANESTHESIA N/A 11/23/2017   Procedure: EMBOLIZATION;  Surgeon: Luanne Bras, MD;  Location: [place];  Service: Radiology;  Laterality: N/A;  . TOTAL ABDOMINAL HYSTERECTOMY    . URETHRA SURGERY      Allergies  Allergies  Allergen Reactions  . Bee Venom Anaphylaxis  . Plavix [Clopidogrel Bisulfate] Shortness Of Breath and Other (See Comments)    QUESTIONABLE ASSOCIATION WITH SYMPTOMS Over the course of a week it made her sore throat and difficulty breathing, believes throat was slowly swelling. Starting a z-pack today 11/17/17 to see if sore throat subsides.  Kara Ochoa [Pickled Meat] Swelling    Lips, legs, arms swell  . Sulfa Antibiotics Anaphylaxis and Shortness Of Breath    Other reaction(s): Angioedema (ALLERGY/intolerance)  . Sulfa Drugs Cross Reactors Anaphylaxis  . Zonisamide Anaphylaxis and Other (See Comments)    Headache medication  . Codeine Hives  . Penicillins Hives and Other (See Comments)    Has patient had a PCN reaction causing immediate rash, facial/tongue/throat swelling, SOB or lightheadedness with hypotension: Unknown Has patient had a PCN reaction causing severe rash involving mucus membranes or skin necrosis: No Has patient  had a PCN reaction that required hospitalization: No Has patient had a PCN reaction occurring within the last 10 years: No If all of the above answers are "NO", then may proceed with Cephalosporin use.   Marland Kitchen Hydrocodone Nausea And Vomiting    History of Present Illness   Kara Ochoa is a 58 y.o. female with a hx of chest pain, HTN, multiple TIA and one CVA, tobacco use, depression, anxiety, fibromyalgia, chronic headaches, constipation, OA last seen by Dr. Debara Pickett 12/07/2016. Presents today to re-establish cardiology care.  Presents after ED visit 01/16/19 to [facility] with witnessed SVT rate 200-220 while at her PCP's office. Associated  with feelings of heart racing and left sided migraine. EMS administered 65m adenosine and converted to SR 90-100bpm. CTA with no PE. CXR mild cardiomegaly.   Tells me she has had these episodes of rapid heart rates for about a year. They happen about every other month. Generally occur towards the end of the month. Associated with migraine over her left eye. Typically resolve with resolution of migraine with her PRN medications. Reports after these episodes her L chest under her breast and through her back is "sore". Associated with some SOB. Tells me she had a similar episode when she had her last monitor ordered two years ago.   Tells me she attributed these episodes to her "heart valves being out of rhythm with each other". Shares that when she was in middle school she had a syncopal episode and subsequent cardiology workup. Tells me she was told she had two valves with leaflets that moved differently than the others. On review of her echo from 12/2016, I see no noted valvular abnormalities.  Follows closely with her PCP. Her PCP noticed her visits to the hospital December 2019 for IVF, March 2020 for confusion, and this most recent ED visits all included hypokalemia. Tells me she was on potassium supplementation for about 5 years a few years ago that was prescribed by her OBGYN at the time.   She has had substantial lab work following her ED visit by her PCP. Includes stool sample which was negative for ecoli. Blood work negative for hepatitis A, B, C. TSH 0.999. K increased to 3.6 from 3.0 in ED. WBC normalized to 9.5 from 12.9 in the ED.   Reports edema in her LE 2 weeks ago that self resolved. Was present for a few days and associated with increased LE bruising. She attributes the bruising to Brilinta which she was prescribed by neurology. Per her report she has a brain aneurysm that is s/p stent and a second that is unable to be stented. She has since stopped her Brilinta and remains on aspirin 325  mg.   Present 1PPD smoker. States her smoking sometimes is 5 cigarettes per day sometimes is a whole pack. She is interested in quitting but scared of medications such as Chantix. We discussed setting goals for quitting and gradual reduction. Discussed stress reduction as she recognizes she smokes more when she is stressed.   EKGs/Labs/Other Studies Reviewed:   The following studies were reviewed today:  01/16/19 CTA Chest Cardiovascular: Thoracic aorta demonstrates a normal branching pattern. Atherosclerotic calcifications are seen without aneurysmal dilatation. No dissection is seen. No cardiac enlargement is noted. No significant coronary calcifications are noted. The pulmonary artery shows a normal branching pattern without intraluminal filling defect.  IMPRESSION: No evidence of pulmonary emboli. Scattered bilateral renal calculi without obstructive change. Aortic Atherosclerosis (ICD10-I70.0).  CXR 01/16/19 IMPRESSION: 1. Small  amount of atelectasis or scarring at the left lung base laterally. 2. Mild cardiomegaly.  Echo 12/21/2016 Left ventricle: The cavity size was normal. Systolic function was   normal. The estimated ejection fraction was in the range of 60%   to 65%. Wall motion was normal; there were no regional wall   motion abnormalities. Left ventricular diastolic function   parameters were normal. - Aortic valve: Transvalvular velocity was within the normal range.   There was no stenosis. There was no regurgitation. - Mitral valve: Transvalvular velocity was within the normal range.   There was no evidence for stenosis. There was no regurgitation. - Right ventricle: The cavity size was normal. Wall thickness was   normal. Systolic function was normal. - Right atrium: The atrium was moderately dilated. - Atrial septum: No defect or patent foramen ovale was identified   by color flow Doppler. - Tricuspid valve: There was trivial regurgitation. - Pulmonary arteries:  Systolic pressure was within the normal   range. PA peak pressure: 26 mm Hg (S). - Pericardium, extracardiac: A trivial pericardial effusion was   identified.   Zio Monitor 12/21/2016 Monitor showed SR with lead artifact. No arrthymias or extrasystoles were noted. Symptom diary did not correlate with EKG abnormalities.   Recent Labs: 01/18/19 via PCP:  ecoli stool sample negative  Hepatitis C, A, B negative  Hb 13.7, WBC 9.5  Phosphorus 3.8, Magnesium 2.0,   GFR 76, ALT 16, AST 17, K 3.6, creatinine 0.86  TSH 0.999  A1c 5.7 01/16/19 via Care Everywhere:  K 3.0, creatinine 0.85, GFR 77,   Hb 13.5, wbc 12.9,   Troponin <0.03, 0.038   Recent Lipid Panel 01/18/19 at PCP:  Total cholesterol 229, HDL 57, LDL 148, trilycerides 122   Home Medications      Current Meds  Medication Sig  . aspirin 325 MG tablet Take 325 mg by mouth daily.  . Calcium Carbonate-Vit D-Min (CALCIUM 1200) 1200-1000 MG-UNIT CHEW Chew 1 tablet by mouth daily.  . calcium-vitamin D (OSCAL WITH D) 500-200 MG-UNIT tablet Take 1 tablet by mouth.  . Cholecalciferol (VITAMIN D PO) Take 5,000 Units by mouth daily.   . Cyanocobalamin (B-12) 1000 MCG/ML KIT Inject 1 mL as directed every 30 (thirty) days.  . cyclobenzaprine (FLEXERIL) 10 MG tablet Take 10 mg by mouth 2 (two) times daily.   . DULoxetine (CYMBALTA) 60 MG capsule Take 60 mg by mouth daily.  Marland Kitchen EPINEPHrine (EPIPEN 2-PAK) 0.3 mg/0.3 mL IJ SOAJ injection Inject 0.3 mg into the muscle daily as needed (allergic reaction).   Eduard Roux (AIMOVIG) 140 MG/ML SOAJ 1 (ONE) SOLUTION AUTO INJECTOR MONTHLY  . famotidine (PEPCID) 40 MG tablet TAKE 1 TABLET BY MOUTH EVERYDAY AT BEDTIME  . gabapentin (NEURONTIN) 100 MG capsule Take 100 mg by mouth 3 (three) times daily.  . Lactobacillus (ACIDOPHILUS PO) Take 1 capsule by mouth every evening. Probiotic 10 Billion +  . meclizine (ANTIVERT) 25 MG tablet Take 1 tablet (25 mg total) by mouth 3 (three) times daily as  needed for dizziness.  Marland Kitchen omeprazole (PRILOSEC) 40 MG capsule Take 40 mg by mouth 2 (two) times daily.  Marland Kitchen oxyCODONE (ROXICODONE) 15 MG immediate release tablet Take 15 mg by mouth every 8 (eight) hours as needed for severe pain.  . promethazine (PHENERGAN) 25 MG tablet Take 25 mg by mouth every 6 (six) hours as needed for nausea or vomiting.  . triamcinolone cream (KENALOG) 0.1 % Apply 1 application topically 2 (two) times daily.  Marland Kitchen  vitamin B-12 (CYANOCOBALAMIN) 1000 MCG tablet Take 1,000 mcg by mouth daily.  . vitamin E 400 UNIT capsule Take 400 Units by mouth daily.     Family History    Family History  Problem Relation Age of Onset  . Cancer Mother   . Depression Mother   . Valvular heart disease Mother        mitral valve reflux  . Other Father        degenerative bone   . Colon cancer Maternal Grandmother   . Stroke Maternal Grandfather   . Colon cancer Paternal Grandmother   . Thyroid disease Daughter   . Basal cell carcinoma Daughter    She indicated that her mother is deceased. She indicated that her father is alive. She indicated that her maternal grandmother is deceased. She indicated that her maternal grandfather is deceased. She indicated that her paternal grandmother is deceased. She indicated that all of her three daughters are alive. She indicated that her son is alive.  Social History    Social History   Socioeconomic History  . Marital status: Legally Separated    Spouse name: Not on file  . Number of children: 4  . Years of education: 65  . Highest education level: Not on file  Occupational History  . Occupation: Hotel manager: [employer]  . Financial resource strain: Not on file  . Food insecurity    Worry: Not on file    Inability: Not on file  . Transportation needs    Medical: Not on file    Non-medical: Not on file  Tobacco Use  . Smoking status: Current Every Day Smoker    Packs/day: 0.25    Years: 38.00     Pack years: 9.50    Types: Cigarettes  . Smokeless tobacco: Never Used  . Tobacco comment: approx 10 packs every 3 weeks  Substance and Sexual Activity  . Alcohol use: No  . 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 Sexual activity: Not on file  Lifestyle  . Physical activity    Days per week: Not on file    Minutes per session: Not on file  . Stress: Not on file  Relationships  . Social Herbalist on phone: Not on file    Gets together: Not on file    Attends religious service: Not on file    Active member of club or organization: Not on file    Attends meetings of clubs or organizations: Not on file    Relationship status: Not on file  . Intimate partner violence    Fear of current or ex partner: Not on file    Emotionally abused: Not on file    Physically abused: Not on file    Forced sexual activity: Not on file  Other Topics Concern  . Not on file  Social History Narrative  . Not on file     Review of Systems    Review of Systems  Constitution: Negative for chills, fever and malaise/fatigue.  Cardiovascular: Positive for leg swelling (a few weeks ago) and palpitations. Negative for chest pain, dyspnea on exertion, irregular heartbeat, near-syncope and syncope.  Respiratory: Negative for cough, shortness of breath and wheezing.   Hematologic/Lymphatic: Bruises/bleeds easily.  Musculoskeletal: Positive for back pain (chronic).  Gastrointestinal: Negative for nausea and vomiting.  Neurological: Negative for dizziness, light-headedness and weakness.    Physical Exam  VS:  BP (!) 152/90 (BP Location: Left Arm, Patient Position: Sitting, Cuff Size: Normal)   Pulse 88   Ht _0  (1.6 m)   Wt 119 lb (54 kg)   SpO2 97%   BMI 21.08 kg/m  , BMI Body mass index is 21.08 kg/m. GEN: Well nourished, well developed, in no acute distress. HEENT: Normal. Neck: Supple, no JVD, carotid bruits, or masses. Cardiac: RRR, no murmurs, rubs, or gallops. No  clubbing, cyanosis, edema.  Radials/DP/PT 2+ and equal bilaterally.  Respiratory:  Respirations regular and unlabored, clear to auscultation bilaterally. GI: Soft, nontender, nondistended, BS + x 4. MS: No deformity or atrophy. Skin: Warm and dry, no rash. Bilateral LE with scattered ecchymosis.  Neuro:  Strength and sensation are intact. Psych: Normal affect.  Accessory Clinical Findings    ECG personally reviewed by me today- SR rate 88 bpm - No acute changes  Assessment & Plan   1. SVT - Seen at [facility] ED 01/16/19 after transfer from her PCP office - converted with 49m adenosine. EKG independently reviewed by me shows SVT convert to SR. Symptoms associated with severe migraine and rapid heart beats. Tells me the migraine was more bothersome than the rapid heart rate. Endorses her L chest below her breast to her back was sore the day after episode, no recurrent chest pain, no indication for ischemic eval at this time with normal EKG today.  Etiology hypokalemia vs heart muscle abnormality vs lifestyle (smoking/stress) vs thyroid.   Start Metorpolol 239mBID. 14 day ZIO. Echocardiogram. TSH, BMET today.   Education provided: SVT handout, list of pro-arrthymic medicaitons, Metoprolol information  2. Palpitations - Plan, as above. Tells me she gets rapid heart beats about once a month towards the end of the month. Previous monitor 2018 with SR.   3. Hypokalemia - K 3.0 while in ED for SVT, tells me she was given PO potassium, rechecked by PCP 01/19/19 was 3.6. Tells me a few years ago she was on potassium prescribed by her OB. PCP tested stool 01/29/19, negative for ecoli. Blood work 01/19/19 with normal liver/kidney function, negative hepatitis A, B, C. Check potassium today, replete as needed. Etiology idiopathic versus GI wasting versus medication side effect. She is on a number of CNS affecting medications for her fibromyalgia and migraines though no direct hypokalemia side effects noted on review  today. She is not on a diuretic.   4. Chest pain in adult - No chest pain today. Tells me after she has episodes of rapid heart rates, as above, her L chest under her L breast to her back is tender to palpation and "sore". No exertional anginal symptoms. This chest pain is infrequent and lasts approximately a day after symptoms of rapid heart rate resolve. EKG today without acute ST/T wave changes. Plan for echo, as above. No ischemic evaluation indicated at this time.  5. Elevated BP - Isolated elevated reading today. BP 122/72 at PCP office on 01/18/19. She checks intermittently at home only when she has a migraine. Asked her to keep a log. May be side effect of her Aimovig for migraine. May require additional anti-hypertensive therapy.  Consider transition Metoprolol to Coreg. Would avoid Amlodipine in setting of recent LE edema.   6. Lower extremity edema - Reports LE edema 2 weeks ago that self resolved. Plan for echocardiogram, as above. May also be side effect of her gabapentin.   Disposition: Echocardiogram. 14 day Zio. Follow up in 1 month with Dr. ToHarriet Masson  Loel Dubonnet, NP 02/06/2019, 10:34 AM

## 2019-02-06 ENCOUNTER — Encounter

## 2019-02-06 ENCOUNTER — Other Ambulatory Visit: Payer: Self-pay

## 2019-02-06 ENCOUNTER — Encounter: Payer: Self-pay | Admitting: *Deleted

## 2019-02-06 ENCOUNTER — Ambulatory Visit (INDEPENDENT_AMBULATORY_CARE_PROVIDER_SITE_OTHER): Payer: Medicare PPO | Admitting: Family

## 2019-02-06 ENCOUNTER — Ambulatory Visit (HOSPITAL_BASED_OUTPATIENT_CLINIC_OR_DEPARTMENT_OTHER)
Admission: RE | Admit: 2019-02-06 | Discharge: 2019-02-06 | Disposition: A | Discharge status: Home / Self Care | Payer: Medicare PPO | Source: Ambulatory Visit | Attending: Family | Admitting: Family

## 2019-02-06 ENCOUNTER — Other Ambulatory Visit: Payer: Self-pay | Admitting: *Deleted

## 2019-02-06 VITALS — BP 152/90 | HR 88 | Ht 63.0 in | Wt 119.0 lb

## 2019-02-06 DIAGNOSIS — I471 Supraventricular tachycardia, unspecified: Secondary | ICD-10-CM

## 2019-02-06 DIAGNOSIS — R030 Elevated blood-pressure reading, without diagnosis of hypertension: Secondary | ICD-10-CM | POA: Diagnosis not present

## 2019-02-06 DIAGNOSIS — R002 Palpitations: Secondary | ICD-10-CM | POA: Diagnosis present

## 2019-02-06 DIAGNOSIS — E876 Hypokalemia: Secondary | ICD-10-CM | POA: Diagnosis not present

## 2019-02-06 LAB — ECHOCARDIOGRAM COMPLETE
Height: 63 in
Weight: 1904 oz

## 2019-02-06 MED ORDER — METOPROLOL TARTRATE 25 MG PO TABS: 25.0000 mg | ORAL_TABLET | Freq: Two times a day (BID) | ORAL | 0 refills | Status: DC

## 2019-02-06 NOTE — Patient Instructions (Signed)
Medication Instructions:   START Metoprolol 25mg  (one tablet) twice daily.  If you need a refill on your cardiac medications before your next appointment, please call your pharmacy.   Lab work: Your physician recommends that you return for lab work today: BMET, TSH  If you have labs (blood work) drawn today and your tests are completely normal, you will receive your results only by: Marland Kitchen MyChart Message (if you have MyChart) OR . A paper copy in the mail If you have any lab test that is abnormal or we need to change your treatment, we will call you to review the results.  Testing/Procedures: You had an EKG today.   Your physician has requested that you have an echocardiogram. Echocardiography is a painless test that uses sound waves to create images of your heart. It provides your doctor with information about the size and shape of your heart and how well your heart's chambers and valves are working. This procedure takes approximately one hour. There are no restrictions for this procedure.  A Zio monitor (remote telemetry monitor) will be mailed to your home.     Supraventricular Tachycardia, Adult Supraventricular tachycardia (SVT) is a kind of abnormal heartbeat. It makes your heart beat very fast and then beat at a normal speed. A normal resting heartbeat is 60-100 times a minute. This condition can make your heart beat more than 150 times a minute. Times of having a fast heartbeat (episodes) can be scary, but they are usually not dangerous. In some cases, they may lead to heart failure if:  They happen many times per day.  Last longer than a few seconds. What are the causes?  A normal heartbeat starts when an area called the sinoatrial node sends out an electrical signal. In SVT, other areas of the heart send out signals that get in the way of the signal from the sinoatrial node. What increases the risk? You are more likely to develop this condition if you are:  50-6 years old.   A woman. The following factors may make you more likely to develop this condition:  Stress.  Tiredness.  Smoking.  Stimulant drugs, such as cocaine and methamphetamine.  Alcohol.  Caffeine.  Pregnancy.  Feeling worried or nervous (anxiety). What are the signs or symptoms?  A pounding heart.  A feeling that your heart is skipping beats (palpitations).  Weakness.  Trouble getting enough air.  Pain or tightness in your chest.  Feeling like you are going to pass out (faint).  Feeling worried or nervous.  Dizziness.  Sweating.  Feeling sick to your stomach (nausea).  Passing out.  Tiredness. Sometimes, there are no symptoms. How is this treated?  Vagal nerve stimulation. Ways to do this include: ? Holding your breath and pushing, as though you are pooping (having a bowel movement). ? Massaging an area on one side of your neck. Do not try this yourself. Only a doctor should do this. If done the wrong way, it can lead to a stroke. ? Bending forward with your head between your legs. ? Coughing while bending forward with your head between your legs. ? Closing your eyes and massaging your eyeballs. Ask a doctor how to do this.  Medicines that prevent attacks.  Medicine to stop an attack given through an IV tube at the hospital.  A small electric shock (cardioversion) that stops an attack.  Radiofrequency ablation. In this procedure, a small, thin tube (catheter) is used to send energy to the area that is  causing the rapid heartbeats. If you do not have symptoms, you may not need treatment. Follow these instructions at home: Stress  Avoid things that make you feel stressed.  To deal with stress, try: ? Doing yoga or meditation, or being out in nature. ? Listening to relaxing music. ? Doing deep breathing. ? Taking steps to be healthy, such as getting lots of sleep, exercising, and eating a balanced diet. ? Talking with a mental health doctor. Lifestyle    Try to get at least 7 hours of sleep each night.  Do not use any products that contain nicotine or tobacco, such as cigarettes, e-cigarettes, and chewing tobacco. If you need help quitting, ask your doctor.  Be aware of how alcohol affects you. ? If alcohol gives you a fast heartbeat, do not drink alcohol. ? If alcohol does not seem to give you a fast heartbeat, limit alcohol use to no more than 1 drink a day for women who are not pregnant, and 2 drinks a day for men. In the [place] one drink is one of these: ? 12 oz of beer (355 mL). ? 5 oz of wine (148 mL). ? 1 oz of hard liquor (44 mL).  Be aware of how caffeine affects you. ? If caffeine gives you a fast heartbeat, do not eat, drink, or use anything with caffeine in it. ? If caffeine does not seem to give you a fast heartbeat, limit how much caffeine you eat, drink, or use.  Do not use stimulant drugs. If you need help quitting, ask your doctor. General instructions  Stay at a healthy weight.  Exercise regularly. Ask your doctor about good activities for you. Try one or a mixture of these: ? 150 minutes a week of gentle exercise, like walking or yoga. ? 75 minutes a week of exercise that is very active, like running or swimming.  Do vagus nerve treatments to slow down your heartbeat as told by your doctor.  Take over-the-counter and prescription medicines only as told by your doctor.  Keep all follow-up visits as told by your doctor. This is important. Contact a doctor if:  You have a fast heartbeat more often.  Times of having a fast heartbeat last longer than before.  Home treatments to slow down your heartbeat do not help.  You have new symptoms. Get help right away if:  You have chest pain.  Your symptoms get worse.  You have trouble breathing.  Your heart beats very fast for more than 20 minutes.  You pass out. These symptoms may be an emergency. Do not wait to see if the symptoms will go away. Get  medical help right away. Call your local emergency services (911 in the [place] Do not drive yourself to the hospital. Summary  SVT is a type of abnormal heart beat.  This condition can make your heart beat more than 150 times a minute.  Treatment depends on how often the condition happens and your symptoms. This information is not intended to replace advice given to you by your health care provider. Make sure you discuss any questions you have with your health care provider. Document Released: 05/30/2005 Document Revised: 04/17/2018 Document Reviewed: 04/17/2018 Elsevier Patient Education  [place]: At [facility], you and your health needs are our priority.  As part of our continuing mission to provide you with exceptional heart care, we have created designated Provider Care Teams.  These Care Teams include your primary Cardiologist (physician)  and Advanced Practice Providers (APPs -  Physician Assistants and Nurse Practitioners) who all work together to provide you with the care you need, when you need it.  Any Other Special Instructions Will Be Listed Below (If Applicable).  Please check blood pressure and heart rate daily and keep a log.   Medications and palpitations:  1. Avoid all over-the-counter antihistamines except Claritin/Loratadine and Zyrtec/Cetrizine. 2. Avoid all combination including cold sinus allergies flu decongestant and sleep medications 3. You can use Robitussin DM Mucinex and Mucinex DM for cough. 4. can use Tylenol aspirin ibuprofen and naproxen but no combinations such as sleep or sinus.

## 2019-02-06 NOTE — Progress Notes (Signed)
  Echocardiogram 2D Echocardiogram has been performed.  Kara Ochoa 02/06/2019, 12:19 PM

## 2019-02-07 ENCOUNTER — Telehealth: Payer: Self-pay | Admitting: Family

## 2019-02-07 LAB — BASIC METABOLIC PANEL
BUN/Creatinine Ratio: 23 (ref 9–23)
BUN: 18 mg/dL (ref 6–24)
CO2: 23 mmol/L (ref 20–29)
Calcium: 9.4 mg/dL (ref 8.7–10.2)
Chloride: 101 mmol/L (ref 96–106)
Creatinine, Ser: 0.79 mg/dL (ref 0.57–1.00)
GFR calc Af Amer: 97 mL/min/{1.73_m2} (ref >59)
GFR calc non Af Amer: 84 mL/min/{1.73_m2} (ref >59)
Glucose: 89 mg/dL (ref 65–99)
Potassium: 4.5 mmol/L (ref 3.5–5.2)
Sodium: 139 mmol/L (ref 134–144)

## 2019-02-07 LAB — TSH: TSH: 0.745 u[IU]/mL (ref 0.450–4.500)

## 2019-02-07 NOTE — Telephone Encounter (Signed)
Called patient regarding results of labs and echocardiogram at office visit 02/06/19. Left message on voicemail per DPR.   She was informed of normal thyroid function, normal kidney function, normal electrolytes. Previous hypokalemia resolved with K 4.5. Would not recommend potassium supplementation at this time.   Echocardiogram with good result. Normal pumping function, normal valves.   No change to current plan of care.   These results will be forwarded via Epic Fax function to her PCP for continuity of care.   Loel Dubonnet, NP

## 2019-02-07 NOTE — Telephone Encounter (Signed)
Please call, she has questions

## 2019-02-07 NOTE — Telephone Encounter (Signed)
Spoke with Mrs. Jaquess.   She notes "heart beating fast" for about an hour. She is at her cousin's house and does not have BP cuff nor pulse oximetry available to her.  She had an episode of sweating about 20 minutes ago. No dizziness, lightheadedness, chest pain, SOB. She has not yet taken her morning Metoprolol as the directions said to take with food. Advised to take a dose now. Educated that she may take this medication without regard to meals. She is agreeable to this plan and will call with any further concerns.   She is planning to go see her daughter in [place] after her testing (Zio) in complete. Requests a letter stating that she has history of SVT in case of incident. We will provide for her at her upcoming follow.   Loel Dubonnet, NP

## 2019-02-13 ENCOUNTER — Ambulatory Visit (INDEPENDENT_AMBULATORY_CARE_PROVIDER_SITE_OTHER): Payer: Medicare PPO

## 2019-02-13 DIAGNOSIS — I471 Supraventricular tachycardia: Secondary | ICD-10-CM | POA: Diagnosis not present

## 2019-02-19 ENCOUNTER — Telehealth: Payer: Self-pay | Admitting: Family

## 2019-02-19 NOTE — Telephone Encounter (Signed)
Taken to [facility] in [place] Friday morning via EMS for severe abdominal pain. Tells me she was diagnosed with hepatitis A. She was discharged last night. Reports running a fever today and still feeling fatigued and swollen.  She reports pain in her stomach, throwing up, migraine, and that her "chest got to hurting" while hospitalized.   She has questions regarding her ZIO monitor. Device came in Monday via mail 02/11/19 - she had her PCP place as she was uncertain regarding the directions on 02/13/19. She wore for 2 days then it was adjusted while in the hospital. She removed it today as the adhesive was not sticking. She was asked to mail the monitor back. Reports no recurrent episodes of rapid heart rates since initiation of Metoprolol 25mg  BID. Doubtful we will need to reapply monitor, but will discuss at follow up with Dr. Harriet Masson.   She has a follow up 02/25/19 with Dr. Harriet Masson. BP cuff at home, she has been checking regularly - she will bring to her next office visit.   Has question regarding a bottle of Metolazone. Last I can see this was discontinued 11/2017. Has not been taking. Instructed to dispose of this medication.   Loel Dubonnet, NP

## 2019-02-20 ENCOUNTER — Ambulatory Visit: Payer: Medicare PPO | Admitting: Cardiology

## 2019-02-25 ENCOUNTER — Encounter: Payer: Self-pay | Admitting: Cardiology

## 2019-02-25 ENCOUNTER — Other Ambulatory Visit: Payer: Self-pay

## 2019-02-25 ENCOUNTER — Ambulatory Visit (INDEPENDENT_AMBULATORY_CARE_PROVIDER_SITE_OTHER): Payer: Medicare PPO | Admitting: Cardiology

## 2019-02-25 VITALS — BP 118/74 | HR 64 | Ht 63.0 in | Wt 120.4 lb

## 2019-02-25 DIAGNOSIS — I471 Supraventricular tachycardia: Secondary | ICD-10-CM | POA: Diagnosis not present

## 2019-02-25 DIAGNOSIS — R0602 Shortness of breath: Secondary | ICD-10-CM

## 2019-02-25 DIAGNOSIS — R600 Localized edema: Secondary | ICD-10-CM

## 2019-02-25 DIAGNOSIS — I10 Essential (primary) hypertension: Secondary | ICD-10-CM

## 2019-02-25 NOTE — Patient Instructions (Signed)
Medication Instructions:  Your physician recommends that you continue on your current medications as directed. Please refer to the Current Medication list given to you today.  If you need a refill on your cardiac medications before your next appointment, please call your pharmacy.   Lab work: Your physician recommends that you return for lab work in: TODAY BMP,Magnesium  3 months:BMP,Magnesium  If you have labs (blood work) drawn today and your tests are completely normal, you will receive your results only by: Marland Kitchen MyChart Message (if you have MyChart) OR . A paper copy in the mail If you have any lab test that is abnormal or we need to change your treatment, we will call you to review the results.  Testing/Procedures: None  Follow-Up: At [facility], you and your health needs are our priority.  As part of our continuing mission to provide you with exceptional heart care, we have created designated Provider Care Teams.  These Care Teams include your primary Cardiologist (physician) and Advanced Practice Providers (APPs -  Physician Assistants and Nurse Practitioners) who all work together to provide you with the care you need, when you need it. You will need a follow up appointment in 3 months.  You may see Berniece Salines, DO  Any Other Special Instructions Will Be Listed Below (If Applicable).

## 2019-02-25 NOTE — Progress Notes (Signed)
Cardiology Office Note:    Date:  02/25/2019   ID:  Kara Ochoa, DOB 09/14/1960, MRN [id number]  PCP:  Bonnita Nasuti, MD  Cardiologist:  Berniece Salines, DO  Electrophysiologist:  None   Referring MD: Bonnita Nasuti, MD   Follow up  History of Present Illness:    Kara Ochoa is a 58 y.o. 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  During that visit metoprolol tartrate 25 mg twice daily was started paroxysmal SVT and an echocardiogram and ZIO patch was ordered.  In the interim she was admitted to the [facility] for what she describes as bowel blockage and notes that she was diagnosed with hepatitis a during that admission.  She still recovering and reports that she she is still experiencing fatigue.  Today she denies any repeat episodes of SVT.  She is reports that since her last visit and she started the metoprolol she has not had any episodes.  Unfortunately, her duration for wearing her ZIO patch was interrupted by her hospital stay and she was not able to mail the patch out. She is here today with her son. Past Medical History:  Diagnosis Date  . Allergy history unknown   . Aneurysm ([place])   . Anxiety   . Arthritis   . Chest pain    Pressure  . Constipation   . CVA (cerebral vascular accident) ([place])    2016, 2018  . Depression   . Family history of adverse reaction to anesthesia    pt had mother had PONV, difficulty waking up  . Fatigue   . Fibromyalgia   . GERD (gastroesophageal reflux disease)   . Headache(784.0)   . Kidney stone   . Lightheadedness   . Migraines   . Mini stroke ([place]) 2011  . OA (osteoarthritis)   . PONV (postoperative nausea and vomiting)   . Primary generalized (osteo)arthritis   . Reflex neuropathic bladder, not elsewhere classified   . SOB (shortness of breath)   . SVT (supraventricular tachycardia) ([place]) 01/16/2019   (a) 01/16/19 ED visit  [facility] after witnessed SVT at PCP office rate 200-220    Past Surgical History:  Procedure Laterality Date  . BRAIN SURGERY  11/2017  . BREAST CYST EXCISION     lanced   . CARDIOVASCULAR STRESS TEST  2011   with IV   . COLONOSCOPY W/ BIOPSIES AND POLYPECTOMY    . HEMORROIDECTOMY    . IR 3D INDEPENDENT WKST  11/23/2017  . IR ANGIO INTRA EXTRACRAN SEL COM CAROTID INNOMINATE UNI L MOD SED  11/23/2017  . IR ANGIO INTRA EXTRACRAN SEL INTERNAL CAROTID UNI R MOD SED  11/23/2017  . IR ANGIO VERTEBRAL SEL SUBCLAVIAN INNOMINATE UNI R MOD SED  11/23/2017  . IR ANGIO VERTEBRAL SEL VERTEBRAL UNI L MOD SED  11/23/2017  . IR ANGIOGRAM FOLLOW UP STUDY  11/23/2017  . IR RADIOLOGIST EVAL & MGMT  11/09/2017  . IR TRANSCATH/EMBOLIZ  11/23/2017  . KIDNEY STONE SURGERY    . RADIOLOGY WITH ANESTHESIA N/A 11/23/2017   Procedure: EMBOLIZATION;  Surgeon: Luanne Bras, MD;  Location: [place];  Service: Radiology;  Laterality: N/A;  . TOTAL ABDOMINAL HYSTERECTOMY    . URETHRA SURGERY      Current Medications: Current Meds  Medication Sig  . butalbital-aspirin-caffeine (FIORINAL) 50-325-40 MG capsule TAKE 1 TO 2 CAPSULES BY MOUTH EVERY 4 HOURS AS NEEDED FOR  HEADACHE MAX 6 CAPS PER DAY  . Calcium-Magnesium-Vitamin D (CALCIUM 1200+D3 PO) Take 1 tablet by mouth daily.  . Cholecalciferol (VITAMIN D PO) Take 5,000 Units by mouth daily.   . Cyanocobalamin (B-12) 1000 MCG/ML KIT Inject 1 mL as directed every 30 (thirty) days.  . cyclobenzaprine (FLEXERIL) 10 MG tablet Take 10 mg by mouth 2 (two) times daily.   . DULoxetine (CYMBALTA) 30 MG capsule Take 1 capsule by mouth daily.  . DULoxetine (CYMBALTA) 60 MG capsule Take 60 mg by mouth daily.  Marland Kitchen EPINEPHrine (EPIPEN 2-PAK) 0.3 mg/0.3 mL IJ SOAJ injection Inject 0.3 mg into the muscle daily as needed (allergic reaction).   Eduard Roux (AIMOVIG) 140 MG/ML SOAJ 1 (ONE) SOLUTION AUTO INJECTOR MONTHLY  . famotidine (PEPCID) 40 MG tablet TAKE 1 TABLET BY MOUTH EVERYDAY AT  BEDTIME  . gabapentin (NEURONTIN) 100 MG capsule Take 100 mg by mouth 3 (three) times daily.  . Lactobacillus (ACIDOPHILUS PO) Take 1 capsule by mouth every evening. Probiotic 10 Billion +  . magnesium oxide (MAG-OX) 400 MG tablet Take 1 tablet by mouth daily.  . meclizine (ANTIVERT) 25 MG tablet Take 1 tablet (25 mg total) by mouth 3 (three) times daily as needed for dizziness.  . metoprolol tartrate (LOPRESSOR) 25 MG tablet Take 1 tablet (25 mg total) by mouth 2 (two) times daily.  . Misc Natural Products (APPLE CIDER VINEGAR DIET PO) Take 1 tablet by mouth daily.  Marland Kitchen MOVANTIK 25 MG TABS tablet TAKE 1 TABLET BY MOUTH EVERY DAY IN THE MORNING  . omeprazole (PRILOSEC) 40 MG capsule Take 40 mg by mouth 2 (two) times daily.  Marland Kitchen oxyCODONE (ROXICODONE) 15 MG immediate release tablet Take 15 mg by mouth every 8 (eight) hours as needed for severe pain.  . polyethylene glycol (MIRALAX / GLYCOLAX) 17 g packet Take 17 g by mouth daily.  . promethazine (PHENERGAN) 25 MG tablet Take 25 mg by mouth every 6 (six) hours as needed for nausea or vomiting.  . triamcinolone cream (KENALOG) 0.1 % Apply 1 application topically 2 (two) times daily.  . vitamin B-12 (CYANOCOBALAMIN) 1000 MCG tablet Take 1,000 mcg by mouth daily.  . vitamin E 400 UNIT capsule Take 400 Units by mouth daily.  . [DISCONTINUED] calcium-vitamin D (OSCAL WITH D) 500-200 MG-UNIT tablet Take 1 tablet by mouth.     Allergies:   Bee venom, Plavix [clopidogrel bisulfate], Sausage [pickled meat], Sulfa antibiotics, Sulfa drugs cross reactors, Zonisamide, Codeine, Penicillins, and Hydrocodone   Social History   Socioeconomic History  . Marital status: Legally Separated    Spouse name: Not on file  . Number of children: 4  . Years of education: 14  . Highest education level: Not on file  Occupational History  . Occupation: Hotel manager: [employer]  . Financial resource strain: Not on file  . Food  insecurity    Worry: Not on file    Inability: Not on file  . Transportation needs    Medical: Not on file    Non-medical: Not on file  Tobacco Use  . Smoking status: Current Every Day Smoker    Packs/day: 0.25    Years: 38.00    Pack years: 9.50    Types: Cigarettes  . Smokeless tobacco: Never Used  . Tobacco comment: approx 10 packs every 3 weeks  Substance and Sexual Activity  . Alcohol use: No  . 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  Sexual activity: Not on file  Lifestyle  . Physical activity    Days per week: Not on file    Minutes per session: Not on file  . Stress: Not on file  Relationships  . Social Herbalist on phone: Not on file    Gets together: Not on file    Attends religious service: Not on file    Active member of club or organization: Not on file    Attends meetings of clubs or organizations: Not on file    Relationship status: Not on file  Other Topics Concern  . Not on file  Social History Narrative  . Not on file     Family History: The patient's family history includes Basal cell carcinoma in her daughter; Cancer in her mother; Colon cancer in her maternal grandmother and paternal grandmother; Depression in her mother; Other in her father; Stroke in her maternal grandfather; Thyroid disease in her daughter; Valvular heart disease in her mother.  ROS:     Review of Systems  Constitution: Positive for malaise/fatigue. Negative for decreased appetite.  HENT: Negative for congestion, sore throat and tinnitus.   Eyes: Negative for blurred vision, photophobia and visual halos.  Cardiovascular: Positive for leg swelling. Negative for chest pain, orthopnea and paroxysmal nocturnal dyspnea.  Respiratory: Negative for cough, shortness of breath and sputum production.   Endocrine: Negative for heat intolerance, polyphagia and polyuria.  Skin: Negative for color change, poor wound healing and suspicious lesions.  Musculoskeletal:  Negative for arthritis, gout and joint swelling.  Gastrointestinal: Negative for bloating, change in bowel habit, constipation and vomiting.  Genitourinary: Negative for flank pain, hesitancy and pelvic pain.  Neurological: Positive for weakness. Negative for difficulty with concentration, light-headedness and seizures.  Psychiatric/Behavioral: Positive for depression. Negative for memory loss and substance abuse.  Allergic/Immunologic: Negative for HIV exposure and persistent infections.     EKGs/Labs/Other Studies Reviewed:    The following studies were reviewed today:  EKG: EKG performed on 02/06/2019 sinus rhythm, heart rate 88 bpm, there is inverted T wave in aVL which likely is due to lead placement.  Recent Labs: 02/06/2019: BUN 18; Creatinine, Ser 0.79; Potassium 4.5; Sodium 139; TSH 0.745  Recent Lipid Panel No results found for: CHOL, TRIG, HDL, CHOLHDL, VLDL, LDLCALC, LDLDIRECT  Physical Exam:    VS:  BP 118/74 (BP Location: Left Arm, Patient Position: Sitting, Cuff Size: Normal)   Pulse 64   Ht 5' 3" (1.6 m)   Wt 120 lb 6.4 oz (54.6 kg)   SpO2 96%   BMI 21.33 kg/m     Wt Readings from Last 3 Encounters:  02/25/19 120 lb 6.4 oz (54.6 kg)  02/06/19 119 lb (54 kg)  11/29/17 131 lb (59.4 kg)     GEN:  Well nourished, well developed in no acute distress HEENT: Normal NECK: No JVD; No carotid bruits LYMPHATICS: No lymphadenopathy CARDIAC: S1-S2 noted, RRR, no murmurs, rubs, gallops RESPIRATORY:  Clear to auscultation without rales, wheezing or rhonchi  ABDOMEN: Soft, non-tender, non-distended EXTREMITIES: Bilateral +1 pitting edema pretibial.  No cyanosis no clubbing MUSCULOSKELETAL: No deformity  SKIN: Warm and dry NEUROLOGIC:  Alert and oriented x 3 PSYCHIATRIC:  Normal affect   ASSESSMENT:    1. Shortness of breath   2. Paroxysmal SVT (supraventricular tachycardia) ([place])   3. Essential hypertension   4. Bilateral leg edema    PLAN:     Patient was  recently discharged from the hospital due to hepatitis  A, she has not recovered fully.  She seems to be doing well on her metoprolol therefore going to continue this for now.  There is no need to repeat the zio patch for ambulatory monitoring for now as patient states that her symptoms have resolved.  If symptoms recurred the need for repeat ambulatory monitor would be be assessed. The results from her echocardiogram was reviewed with her and all of her questions were answered. She may benefit from spironolactone for her bilateral leg edema in the setting of her liver disease, but will repeat her BMP and if her potassium and creatinine are appropriate this will be ordered. The results from her echocardiogram was read Her blood pressure is improved from her last visit and patient is also happy with this.  She is in agreement with the above plan.  The patient will follow-up in 3 months.    Medication Adjustments/Labs and Tests Ordered: Current medicines are reviewed at length with the patient today.  Concerns regarding medicines are outlined above.  Orders Placed This Encounter  Procedures  . Basic Metabolic Panel (BMET)  . Magnesium  . Basic Metabolic Panel (BMET)  . Magnesium   No orders of the defined types were placed in this encounter.   Patient Instructions  Medication Instructions:  Your physician recommends that you continue on your current medications as directed. Please refer to the Current Medication list given to you today.  If you need a refill on your cardiac medications before your next appointment, please call your pharmacy.   Lab work: Your physician recommends that you return for lab work in: TODAY BMP,Magnesium  3 months:BMP,Magnesium  If you have labs (blood work) drawn today and your tests are completely normal, you will receive your results only by: Marland Kitchen MyChart Message (if you have MyChart) OR . A paper copy in the mail If you have any lab test that is abnormal  or we need to change your treatment, we will call you to review the results.  Testing/Procedures: None  Follow-Up: At [facility], you and your health needs are our priority.  As part of our continuing mission to provide you with exceptional heart care, we have created designated Provider Care Teams.  These Care Teams include your primary Cardiologist (physician) and Advanced Practice Providers (APPs -  Physician Assistants and Nurse Practitioners) who all work together to provide you with the care you need, when you need it. You will need a follow up appointment in 3 months.  You may see Berniece Salines, DO  Any Other Special Instructions Will Be Listed Below (If Applicable).        Rolly Pancake, DO  02/25/2019 12:09 PM    [facility]

## 2019-02-26 ENCOUNTER — Telehealth: Payer: Self-pay | Admitting: *Deleted

## 2019-02-26 DIAGNOSIS — I10 Essential (primary) hypertension: Secondary | ICD-10-CM

## 2019-02-26 DIAGNOSIS — R600 Localized edema: Secondary | ICD-10-CM

## 2019-02-26 LAB — BASIC METABOLIC PANEL
BUN/Creatinine Ratio: 20 (ref 9–23)
BUN: 14 mg/dL (ref 6–24)
CO2: 26 mmol/L (ref 20–29)
Calcium: 8.8 mg/dL (ref 8.7–10.2)
Chloride: 99 mmol/L (ref 96–106)
Creatinine, Ser: 0.71 mg/dL (ref 0.57–1.00)
GFR calc Af Amer: 110 mL/min/{1.73_m2} (ref >59)
GFR calc non Af Amer: 96 mL/min/{1.73_m2} (ref >59)
Glucose: 113 mg/dL — ABNORMAL HIGH (ref 65–99)
Potassium: 4.1 mmol/L (ref 3.5–5.2)
Sodium: 137 mmol/L (ref 134–144)

## 2019-02-26 LAB — MAGNESIUM: Magnesium: 1.9 mg/dL (ref 1.6–2.3)

## 2019-02-26 MED ORDER — SPIRONOLACTONE 25 MG PO TABS: ORAL_TABLET | ORAL | 1 refills | Status: DC

## 2019-02-26 NOTE — Telephone Encounter (Addendum)
Telephone call to patient. Left message to return call regarding labs and med changes and bloodwork.

## 2019-02-26 NOTE — Telephone Encounter (Signed)
Return call from patient. Informed of lab results and need for aldactone 12.5mg  on Tues ,Thurs and Sat. Pt verbalized understanding

## 2019-02-26 NOTE — Telephone Encounter (Signed)
-----   Message from Berniece Salines, DO sent at 02/26/2019  8:15 AM EDT ----- Her blood work looks good. Cr and potassium good. We will start her on Aldactone 12.5 mg TuThSat. Please ask her to get BMP in one week. We will see her as planned.

## 2019-02-26 NOTE — Addendum Note (Signed)
Addended by: Particia Nearing B on: 02/26/2019 09:15 AM   Modules accepted: Orders

## 2019-03-13 IMAGING — CT CT HEART MORP W/ CTA COR W/ SCORE W/ CA W/CM &/OR W/O CM
1 of 4 series · 12 of 20 positions shown, 15 images · IV contrast (APPLIED)
Comparison: None.

MEDICATIONS:
Lopressor:  100  mg, P.O.

Nitroglycerin 400 mcg, sublingual

CLINICAL DATA: 57-year-old female with history of chest pain.

[Series 6: ds_coradseq 0.5 bv36 3 70% · axial · 0.32mm/px · z∈[+1342,+1441]mm · 12 of 469 slices shown, 15 images]
[im 37/469  vessel]
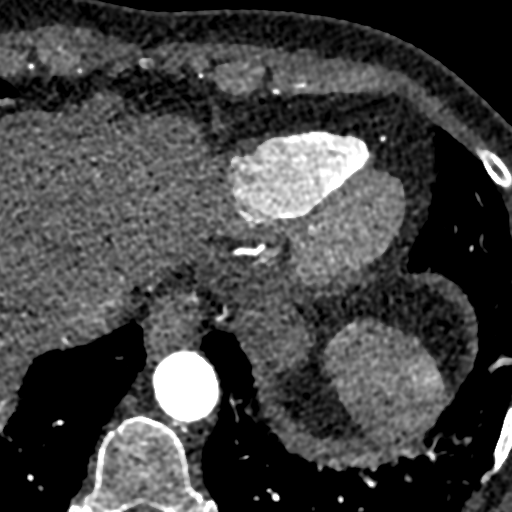
[im 37/469  lung]
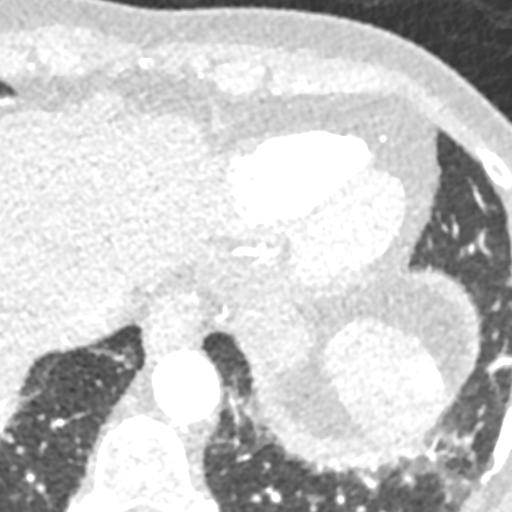
[im 73/469  vessel]
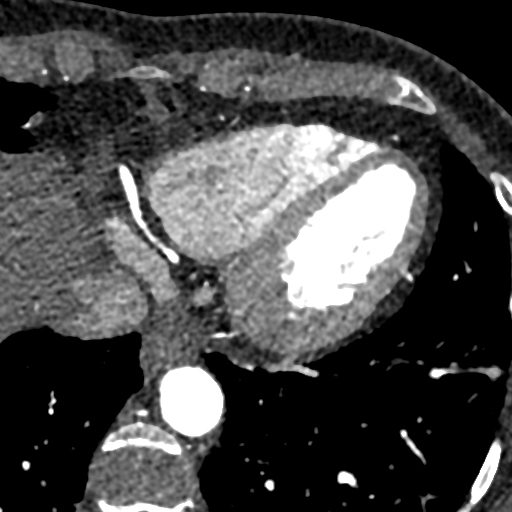
[im 109/469  vessel]
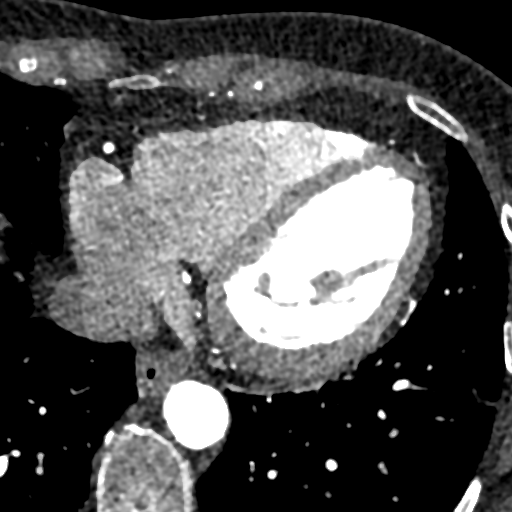
[im 145/469  vessel]
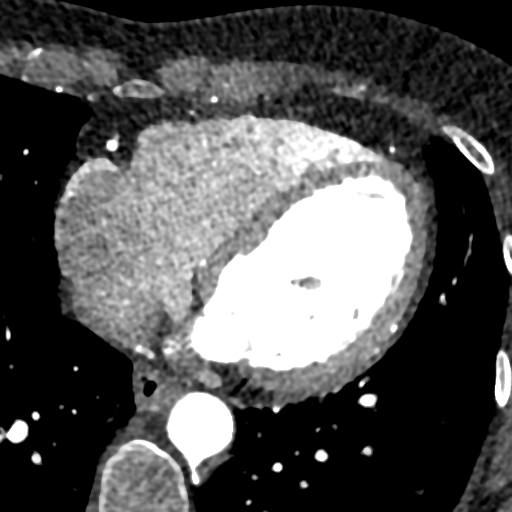
[im 181/469  vessel]
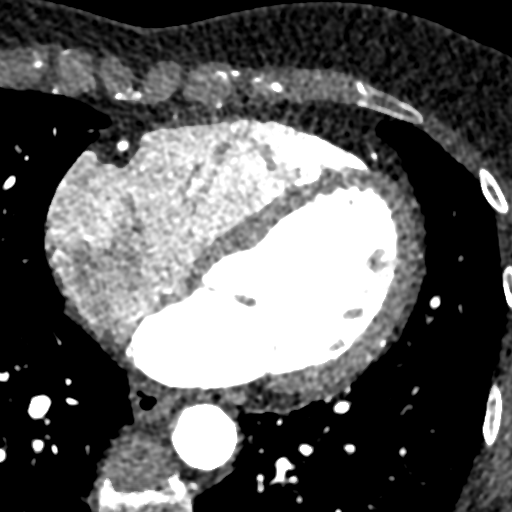
[im 181/469  lung]
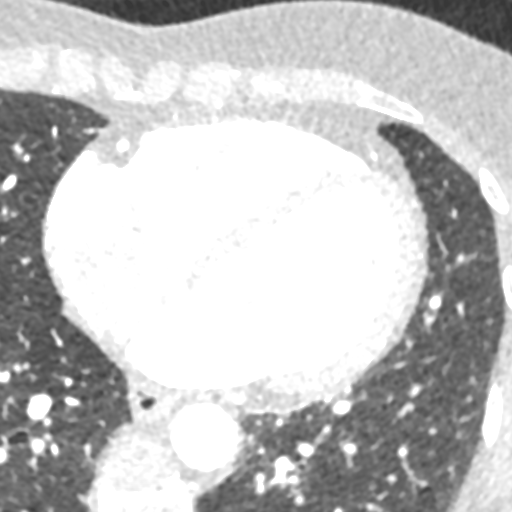
[im 217/469  vessel]
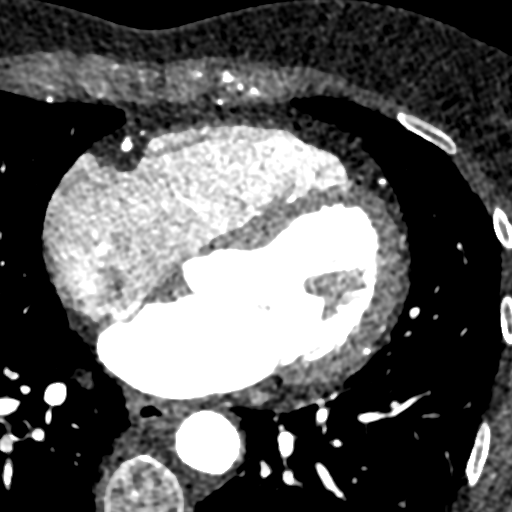
[im 253/469  vessel]
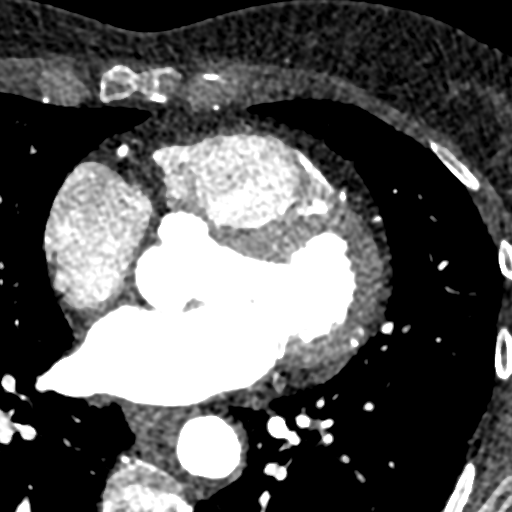
[im 289/469  vessel]
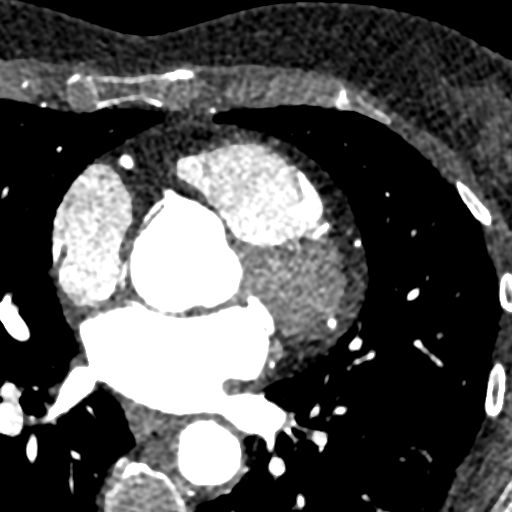
[im 325/469  vessel]
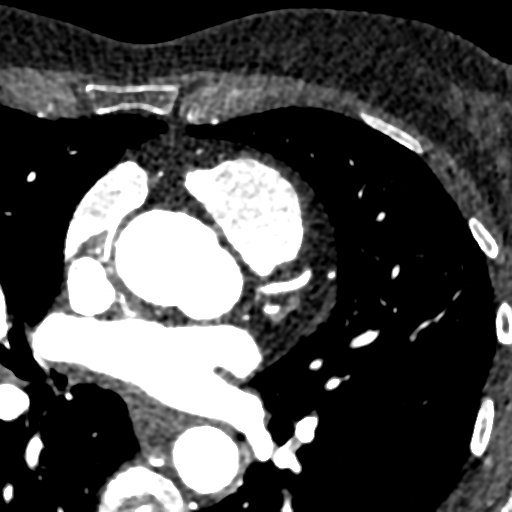
[im 325/469  lung]
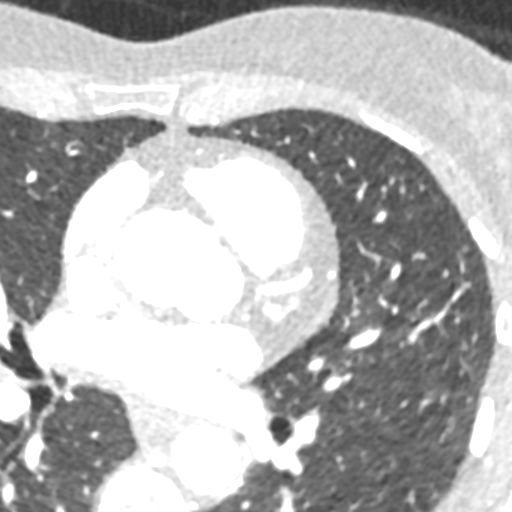
[im 361/469  vessel]
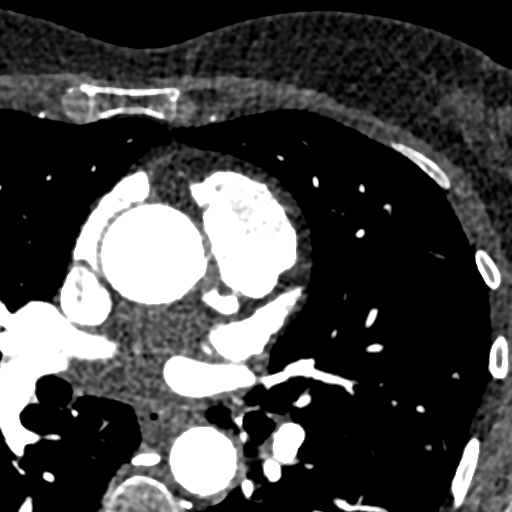
[im 397/469  vessel]
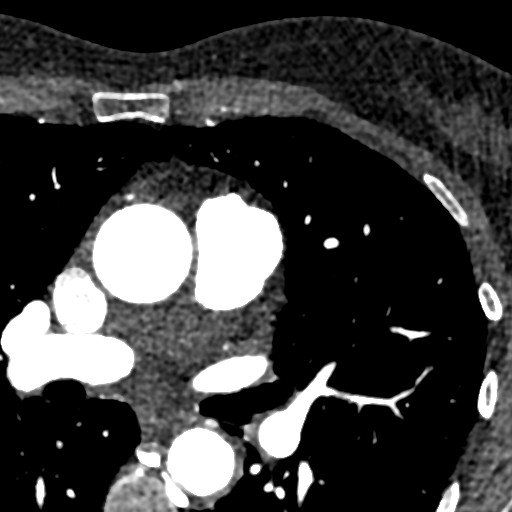
[im 433/469  vessel]
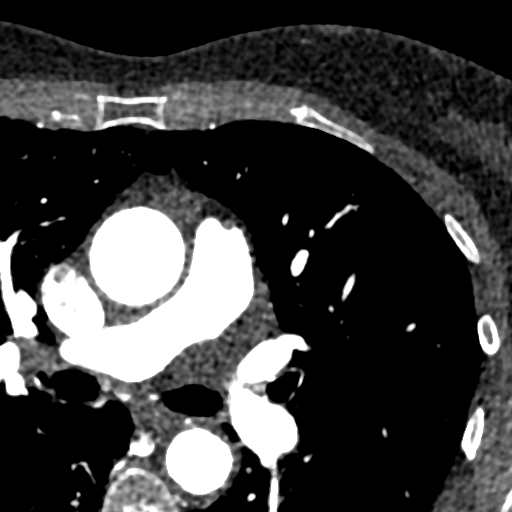

[12 of 20 positions shown; findings below may reference images not displayed]

EXAM:
CT ANGIOGRAPHY OF THE HEART, CORONARY ARTERY, STRUCTURE, AND
MORPHOLOGY

PROCEDURE:
CT angiography of the coronary vessels was performed on a 192
channel system using prospective ECG gating. A scout and ECG-gated
noncontrast exam (for calcium scoring) were performed. Appropriate
delay was determined by bolus tracking after injection of iodinated
contrast, and an ECG-gated coronary CTA was performed with sub-mm
slice collimation during late diastole. Imaging post processing was
performed on an independent workstation creating multiplanar and 3-D
images, allowing for quantitative analysis of the heart and coronary
arteries. Note that this exam targets the heart and the chest was
not imaged in its entirety.
FINDINGS: Technical quality:  Excellent

Heart rate:  55

CORONARY ARTERIES:

Left Main: Small amount of calcified atherosclerotic plaque
proximally, with 1-24% stenosis (CAD-RADS 1).

LAD: No evidence of significant coronary artery atherosclerosis
(CAD-RADS 0).

Diagonals: No evidence of significant coronary artery
atherosclerosis (CAD-RADS 0).

LCx: No evidence of significant coronary artery atherosclerosis
(CAD-RADS 0).

OMs: No evidence of significant coronary artery atherosclerosis
(CAD-RADS 0).

RCA: Small amount of calcified atherosclerotic plaque in the
proximal right coronary artery, with 1-24% stenosis (CAD-RADS 1).

PDA: No evidence of significant coronary artery atherosclerosis
(CAD-RADS 0).

Dominance: Right.

CORONARY CALCIUM: Total Agatston Score:  13

[HOSPITAL] percentile:  83rd

AORTA AND PULMONARY MEASUREMENTS:

Aortic root (21 - 40 mm):

27 mm at the annulus

37  mm at the sinuses of Valsalva

28  mm at the sinotubular junction

Ascending aorta: (< 40 mm):  30 mm

Descending aorta:  (< 40 mm):  21 mm

Main pulmonary artery: (< 30 mm):  19 mm

OTHER FINDINGS: Within the visualized portions of the thorax there
are no suspicious appearing pulmonary nodules or masses, there is no
acute consolidative airspace disease, no pleural effusions, no
pneumothorax and no lymphadenopathy. Visualized portions of the
upper abdomen are unremarkable. There are no aggressive appearing
lytic or blastic lesions noted in the visualized portions of the
skeleton.
IMPRESSION: 1. Mild nonobstructive coronary artery atherosclerosis with plaques
in the left main and right coronary arteries, without significant
stenosis, as detailed above.
2. Patient's total coronary artery calcium score is 13 which is 83rd
percentile for patient's of matched age, gender and race/ethnicity.
3. No significant incidental noncardiac findings are noted.

## 2019-03-14 ENCOUNTER — Telehealth (HOSPITAL_COMMUNITY): Payer: Self-pay

## 2019-03-14 NOTE — Telephone Encounter (Signed)
Called to schedule f/u mri, no answer, left vm. AW 

## 2019-03-26 ENCOUNTER — Telehealth: Payer: Self-pay | Admitting: Cardiology

## 2019-03-26 NOTE — Telephone Encounter (Signed)
Patient would like a return call

## 2019-04-16 ENCOUNTER — Telehealth: Payer: Self-pay | Admitting: Internal Medicine

## 2019-04-16 DIAGNOSIS — I471 Supraventricular tachycardia: Secondary | ICD-10-CM

## 2019-04-16 DIAGNOSIS — E876 Hypokalemia: Secondary | ICD-10-CM

## 2019-04-16 NOTE — Telephone Encounter (Signed)
58 y/o patient with h/o PSVT requiring adenosine in the recent past. Called with 30 mins of tachypalpitations and throat tightness with HR in 180s. Discussed use of vagal maneuvers. If unable to break rhythm in 5 minutes she should call 911. OK to take one dose of home metoprolol as SBP currently in 130s.

## 2019-04-17 MED ORDER — METOPROLOL TARTRATE 25 MG PO TABS: ORAL_TABLET | ORAL | 0 refills | Status: DC

## 2019-04-17 NOTE — Telephone Encounter (Signed)
Returned call.  She called after hours last night for recurrent SVT and spoke with Dr. Haroldine Laws.   Elevated HR x1.5 hours. She tried vagal maneuver, tried coughing with no relief. Tells me when she called EMS her HR 190 on her pulse oximeter. Then when they arrived HR dropped to 80. At time of EKG her HR was down to 70.  Taken to ED at [facility]. Tells me her cardiac enzyme were normal. Her K was low (does not recall value). Took oral replacement.   She was surprised by recurrent episode as she has been taking her Metoprolol regularly. HR at home staying in the 60s. SBP anywhere 100s-136.   Educated make take additional Metoprolol for episodes of SVT with HR >150 as long as her SBP >100. Discussed that hypokalemia could have triggered her episode. Recheck BMET in 1 week. Pending results may consider increasing frequency of spironolactone. Hesitant to increase Metoprolol with resting HR in 60s.   She was offered sooner follow up but prefers to wait until lab results come back. Has an appt wit her PCP 05/01/19.   Loel Dubonnet, NP

## 2019-04-17 NOTE — Telephone Encounter (Signed)
Patient was advised to call and speak to you today, she states she had SVT again last night and they only found her potassium out of range. Please advise.

## 2019-04-17 NOTE — Addendum Note (Signed)
Addended by: Loel Dubonnet on: 04/17/2019 12:47 PM   Modules accepted: Orders

## 2019-04-22 ENCOUNTER — Ambulatory Visit: Payer: Medicare PPO | Admitting: Cardiology

## 2019-04-30 ENCOUNTER — Other Ambulatory Visit: Payer: Self-pay | Admitting: Family

## 2019-04-30 DIAGNOSIS — I471 Supraventricular tachycardia: Secondary | ICD-10-CM

## 2019-05-23 ENCOUNTER — Ambulatory Visit: Payer: Medicare PPO | Admitting: Cardiology

## 2019-06-25 ENCOUNTER — Telehealth: Payer: Self-pay

## 2019-06-25 ENCOUNTER — Other Ambulatory Visit: Payer: Self-pay

## 2019-06-25 MED ORDER — SPIRONOLACTONE 25 MG PO TABS: ORAL_TABLET | ORAL | 1 refills | Status: DC

## 2019-06-25 NOTE — Telephone Encounter (Signed)
Called patient to go over Dr. Mallory Shirk recommendations. Patient understood and verbalized understanding. I told her to call us if she has any further concerns. She thanked me.

## 2019-06-25 NOTE — Telephone Encounter (Signed)
She is taking her Aldactone 12.5 mg Tuesday Thursday and Saturdays please have her take this medication daily.  Tell her to take Aldactone 12.5 mg daily.  Take her blood pressure and make an appointment to see me in a month.

## 2019-06-25 NOTE — Telephone Encounter (Signed)
Called patient and went over monitor results with her. She understood and thanked me.   Patient had additional concerns she wanted advice on. She wanted to let you know that in the past week her BP has been high. She had a dental procedure last Wednesday 06/19/19 and her BP was checked twice during this and it was 174/110 and 164/90. She was having oral surgery on the top and an extraction on the bottom. This was all associated with a headache as well.  Afterwards, patient continued to monitor her BP throughout the week and it was still running high and associated with a headache. She said that her BP today was 167/96 and yesterday it was 154/92 with a headache still present. She stated that she just feels bad and wants to know what she should do. She said that she is open to a tele-visit if needed since she lives in [place], [place] and its a bit of a drive for her to come in.

## 2019-07-10 NOTE — Progress Notes (Signed)
Closing encounter. Letter mailed to patient.

## 2019-08-02 ENCOUNTER — Other Ambulatory Visit: Payer: Self-pay | Admitting: *Deleted

## 2019-08-02 DIAGNOSIS — I471 Supraventricular tachycardia: Secondary | ICD-10-CM

## 2019-08-02 MED ORDER — METOPROLOL TARTRATE 25 MG PO TABS: ORAL_TABLET | ORAL | 0 refills | Status: DC

## 2019-08-08 ENCOUNTER — Ambulatory Visit: Payer: Medicare Other | Admitting: Cardiology

## 2019-08-16 ENCOUNTER — Ambulatory Visit: Admit: 2019-08-16 | Discharge: 2019-08-17 | Payer: MEDICARE

## 2019-08-16 DIAGNOSIS — D492 Neoplasm of unspecified behavior of bone, soft tissue, and skin: Principal | ICD-10-CM

## 2019-08-18 ENCOUNTER — Other Ambulatory Visit: Payer: Self-pay | Admitting: Family

## 2019-08-18 DIAGNOSIS — I471 Supraventricular tachycardia: Secondary | ICD-10-CM

## 2019-08-26 ENCOUNTER — Ambulatory Visit
Admit: 2019-08-26 | Discharge: 2019-08-27 | Payer: MEDICARE | Attending: Student in an Organized Health Care Education/Training Program | Primary: Student in an Organized Health Care Education/Training Program

## 2019-08-26 ENCOUNTER — Encounter (HOSPITAL_BASED_OUTPATIENT_CLINIC_OR_DEPARTMENT_OTHER): Payer: Medicare Other | Admitting: Internal Medicine

## 2019-08-26 DIAGNOSIS — L98492 Non-pressure chronic ulcer of skin of other sites with fat layer exposed: Principal | ICD-10-CM

## 2019-11-10 ENCOUNTER — Other Ambulatory Visit: Payer: Self-pay | Admitting: Cardiology

## 2019-11-10 DIAGNOSIS — I471 Supraventricular tachycardia: Secondary | ICD-10-CM

## 2019-12-23 ENCOUNTER — Ambulatory Visit: Admit: 2019-12-23 | Payer: MEDICARE

## 2020-05-29 ENCOUNTER — Ambulatory Visit: Admit: 2020-05-29 | Discharge: 2020-05-30 | Payer: MEDICARE

## 2020-05-29 DIAGNOSIS — D489 Neoplasm of uncertain behavior, unspecified: Principal | ICD-10-CM

## 2020-05-29 DIAGNOSIS — L719 Rosacea, unspecified: Principal | ICD-10-CM

## 2020-05-29 DIAGNOSIS — L814 Other melanin hyperpigmentation: Principal | ICD-10-CM

## 2020-05-29 DIAGNOSIS — L249 Irritant contact dermatitis, unspecified cause: Principal | ICD-10-CM

## 2020-05-29 DIAGNOSIS — D229 Melanocytic nevi, unspecified: Principal | ICD-10-CM

## 2020-05-29 DIAGNOSIS — L820 Inflamed seborrheic keratosis: Principal | ICD-10-CM

## 2020-05-29 DIAGNOSIS — L821 Other seborrheic keratosis: Principal | ICD-10-CM

## 2020-05-29 MED ORDER — METRONIDAZOLE 0.75 % TOPICAL CREAM
Freq: Two times a day (BID) | TOPICAL | 1 refills | 0.00000 days | Status: CP
Start: 2020-05-29 — End: 2021-05-29

## 2020-05-29 MED ORDER — HYDROCORTISONE 2.5 % TOPICAL OINTMENT
Freq: Two times a day (BID) | TOPICAL | 0 refills | 0.00000 days | Status: CP
Start: 2020-05-29 — End: 2021-05-29

## 2020-08-28 ENCOUNTER — Ambulatory Visit: Admit: 2020-08-28 | Discharge: 2020-08-29 | Payer: MEDICARE

## 2020-08-28 DIAGNOSIS — D1801 Hemangioma of skin and subcutaneous tissue: Principal | ICD-10-CM

## 2020-08-28 DIAGNOSIS — D492 Neoplasm of unspecified behavior of bone, soft tissue, and skin: Principal | ICD-10-CM

## 2020-08-28 DIAGNOSIS — L821 Other seborrheic keratosis: Principal | ICD-10-CM

## 2020-08-28 DIAGNOSIS — L249 Irritant contact dermatitis, unspecified cause: Principal | ICD-10-CM

## 2020-08-28 DIAGNOSIS — L719 Rosacea, unspecified: Principal | ICD-10-CM

## 2020-08-28 DIAGNOSIS — L814 Other melanin hyperpigmentation: Principal | ICD-10-CM

## 2020-08-28 DIAGNOSIS — L820 Inflamed seborrheic keratosis: Principal | ICD-10-CM

## 2020-08-28 DIAGNOSIS — Z85828 Personal history of other malignant neoplasm of skin: Principal | ICD-10-CM

## 2020-08-28 DIAGNOSIS — D229 Melanocytic nevi, unspecified: Principal | ICD-10-CM

## 2020-09-28 ENCOUNTER — Ambulatory Visit: Admit: 2020-09-28 | Discharge: 2020-09-29 | Payer: MEDICARE

## 2020-11-27 ENCOUNTER — Ambulatory Visit: Admit: 2020-11-27 | Discharge: 2020-11-28 | Payer: MEDICARE

## 2020-11-27 DIAGNOSIS — W548XXA Other contact with dog, initial encounter: Principal | ICD-10-CM

## 2020-11-27 DIAGNOSIS — Z85828 Personal history of other malignant neoplasm of skin: Principal | ICD-10-CM

## 2020-11-27 MED ORDER — MUPIROCIN 2 % TOPICAL OINTMENT
Freq: Three times a day (TID) | TOPICAL | 0 refills | 7.00000 days | Status: CP
Start: 2020-11-27 — End: 2020-12-04

## 2020-12-03 ENCOUNTER — Ambulatory Visit: Payer: Medicare HMO | Admitting: Allergy & Immunology

## 2020-12-03 ENCOUNTER — Encounter: Payer: Self-pay | Admitting: Allergy & Immunology

## 2020-12-03 ENCOUNTER — Other Ambulatory Visit: Payer: Self-pay

## 2020-12-03 VITALS — BP 106/78 | HR 77 | Temp 98.7°F | Resp 16 | Ht 62.0 in | Wt 113.4 lb

## 2020-12-03 DIAGNOSIS — G43909 Migraine, unspecified, not intractable, without status migrainosus: Secondary | ICD-10-CM

## 2020-12-03 DIAGNOSIS — J310 Chronic rhinitis: Secondary | ICD-10-CM

## 2020-12-03 DIAGNOSIS — R0602 Shortness of breath: Secondary | ICD-10-CM | POA: Diagnosis not present

## 2020-12-03 MED ORDER — AZELASTINE HCL 0.1 % NA SOLN: 1.0000 | Freq: Two times a day (BID) | NASAL | 5 refills | Status: AC

## 2020-12-03 MED ORDER — BUDESONIDE-FORMOTEROL FUMARATE 160-4.5 MCG/ACT IN AERO: 2.0000 | INHALATION_SPRAY | Freq: Two times a day (BID) | RESPIRATORY_TRACT | 6 refills | Status: AC

## 2020-12-03 MED ORDER — MOMETASONE FUROATE 50 MCG/ACT NA SUSP: 1.0000 | Freq: Two times a day (BID) | NASAL | 5 refills | Status: AC

## 2020-12-03 NOTE — Patient Instructions (Signed)
1. SOB (shortness of breath) - Lung testing was largely normal, but it did not improve with the albuterol treatment. - We are going to start Symbicort 160 mcg 2 puffs twice daily (contains a long-acting albuterol combined with an inhaled steroid). - Spacer use reviewed. - Daily controller medication(s): Symbicort 160/4.66mcg two puffs twice daily with spacer - Prior to physical activity: albuterol 2 puffs 10-15 minutes before physical activity. - Rescue medications: albuterol 4 puffs every 4-6 hours as needed - Asthma control goals:  * Full participation in all desired activities (may need albuterol before activity) * Albuterol use two time or less a week on average (not counting use with activity) * Cough interfering with sleep two time or less a month * Oral steroids no more than once a year * No hospitalizations  2. Chronic rhinitis - Testing today showed: NEGATIVE TO THE ENTIRE PANEL - Copy of test results provided.  - Stop taking: Nasacort - Continue with: Zyrtec (cetirizine) 10mg  tablet once daily - Start taking: Nasonex (momentasone) one spray per nostril TWICE DAILY and Astelin (azelastine) 1 spray TWICE DAILY - You can use an extra dose of the antihistamine, if needed, for breakthrough symptoms.  - Consider nasal saline rinses 1-2 times daily to remove allergens from the nasal cavities as well as help with mucous clearance (this is especially helpful to do before the nasal sprays are given)  3. No follow-ups on file.    Please inform of any Emergency Department visits, hospitalizations, or changes in symptoms. Call us before going to the ED for breathing or allergy symptoms since we might be able to fit you in for a sick visit. Feel free to contact us anytime with any questions, problems, or concerns.  It was a pleasure to meet you today!  Websites that have reliable patient information: 1. American Academy of Asthma, Allergy, and Immunology: www.aaaai.org 2. Food Allergy  Research and Education (FARE): foodallergy.org 3. Mothers of Asthmatics: http://www.asthmacommunitynetwork.org 4. American College of Allergy, Asthma, and Immunology: www.acaai.org   COVID-19 Vaccine Information can be found at: [place] For questions related to vaccine distribution or appointments, please email [email or web address] or call [phone].   We realize that you might be concerned about having an allergic reaction to the COVID19 vaccines. To help with that concern, WE ARE OFFERING THE COVID19 VACCINES IN OUR OFFICE! Ask the front desk for dates!     "Like" [phone] on Facebook and Instagram for our latest updates!      A healthy democracy works best when [place] participate! Make sure you are registered to vote! If you have moved or changed any of your contact information, you will need to get this updated before voting!  In some cases, you MAY be able to register to vote online: [employer]

## 2020-12-03 NOTE — Progress Notes (Signed)
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 SOB (shortness of breath) - Lung testing was largely normal, but it did not improve with the albuterol treatment. - We are going to start Symbicort 160 mcg 2 puffs twice daily (contains a long-acting albuterol combined with an inhaled steroid). - Spacer use reviewed. - Daily controller medication(s): Symbicort 160/4.14mg two puffs twice daily with spacer - Prior to physical activity: albuterol 2 puffs 10-15 minutes before physical activity. - Rescue medications: albuterol 4 puffs every 4-6 hours as needed - Asthma control goals:  * Full participation in all desired activities (may need albuterol before activity) * Albuterol use two time or less a week on average (not counting use with activity) * Cough interfering with sleep two time or less a month * Oral steroids no more than once a year * No hospitalizations  2. Chronic rhinitis - Testing today showed: NEGATIVE TO THE ENTIRE PANEL - Copy of test results provided.  - Stop taking: Nasacort - Continue with: Zyrtec (cetirizine) 130mtablet once daily - Start taking: Nasonex (momentasone) one spray per nostril TWICE DAILY and Astelin (azelastine) 1 spray TWICE DAILY - You can use an extra dose of the antihistamine, if needed, for breakthrough symptoms.  - Consider nasal saline rinses 1-2 times daily to remove allergens from the nasal cavities as well as help with mucous clearance (this is especially helpful to do before the nasal sprays are given)  3. Return in about 6 weeks (around 01/14/2021).    This note in its entirety was  forwarded to the Provider who requested this consultation.  Subjective:   Kara Ochoa a 60o. female presenting today for evaluation of  Chief Complaint  Patient presents with   Breathing Problem    Had chest tightness, wheezing, coughing and SOB 2 weeks ago and went to the ER.   Allergic Reaction    taking    Kara Giesleras a history of the following: Patient Active Problem List   Diagnosis Date Noted   Paroxysmal SVT (supraventricular tachycardia) ([facility]/14/[facility]   Essential hypertension 02/25/2019   SOB (shortness of breath)    Migraines    CVA (cerebral vascular accident) ([facility]   SVT (supra[facility]ricular tachycardia) ([facility]/10/2018   Brain aneurysm 11/23/2017   Chest pain 12/07/2016   Palpitations 12/07/2016   Shortness of breath 12/07/2016   Murmur 12/07/2016   Leg edema 06/30/2011    History obtained from: chart review and patient.  Kara Langoas referred by HaBonnita NasutiMD.     Kara Ochoa a 60.o. female presenting for an evaluation of breathing problems .  She tells me that she is having a "reaction to something" where she is living. She has allergies to "all kinds of crap" but whatever is around where she is staying is triggering her symptoms.  Asthma/Respiratory Symptom History: She has been having "breathing spells" where she is having trouble getting air into her lungs. She went to the ED and she was diagnosed with an asthma attack. Breathing treatments seem to help.  CXR was normal. She received several medications for it. She was not  discharged with prednisone at all. This is the first time that she had these breathing problems. She never had asthma as a child. She smokes 10 cigarettes per day for several years.   Allergic Rhinitis Symptom History: She has been on cetirizine and Nasacort daily for several years. This is at least the last 2-3 years. It typically gets worse as the weather warms up.   Food Allergy Symptom History: She is  currently avoiding chocolate because that seems to trigger her migraines. She does drink caffeine.   She does have reflux and is on omeprazole twice a day and famotidine at night.  She has two migraine medications: Ajovy once monthly and a rescue medication. This is working very well and was started in January. Her migraines are in fairly good control at this point. She does have some food triggers to her migraines, but no anaphylactic symptoms to her knowledge.  She had a brain aneurysm. This was diagnosed in 2018 and she had brain surgery June 2019. She had migraines since she was 60 years of age. She has had a CVA in 2016. This is why she is on disability. She has a history of memory loss as well as heart issues (tachycardia, treated with metoprolol). 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 This has affected her wound healing.   Otherwise, there is no history of other atopic diseases, including drug allergies, stinging insect allergies, eczema, urticaria, or contact dermatitis. There is no significant infectious history. Vaccinations are up to date.    Past Medical History: Patient Active Problem List   Diagnosis Date Noted   Paroxysmal SVT (supraventricular tachycardia) ([facility]) 02/25/2019   Essential hypertension 02/25/2019   SOB (shortness of breath)    Migraines    CVA (cerebral vascular accident) ([facility])    SVT (supraventricular tachycardia) ([facility]) 01/16/2019   Brain aneurysm 11/23/2017   Chest pain 12/07/2016   Palpitations 12/07/2016   Shortness of breath 12/07/2016   Murmur 12/07/2016   Leg edema 06/30/2011    Medication List:  Allergies as of 12/03/2020       Reactions   Bee Venom Anaphylaxis   Plavix [clopidogrel Bisulfate] Shortness Of Breath, Other (See Comments)   QUESTIONABLE ASSOCIATION WITH SYMPTOMS Over the course of a week it made her sore throat and difficulty breathing, believes throat  was slowly swelling. Starting a z-pack today 11/17/17 to see if sore throat subsides.   Sausage [pickled Meat] Swelling   Lips, legs, arms swell   Sulfa Antibiotics Anaphylaxis, Shortness Of Breath   Other reaction(s): Angioedema (ALLERGY/intolerance)   Sulfa Drugs Cross Reactors Anaphylaxis   Zonisamide Anaphylaxis, Other (See Comments)   Headache medication   Carisoprodol Rash   Codeine Hives   Penicillins Hives, Other (See Comments)   Has patient had a PCN reaction causing immediate rash, facial/tongue/throat swelling, SOB or lightheadedness with hypotension: Unknown Has patient had a PCN reaction causing severe rash involving mucus membranes or skin necrosis: No Has patient had a PCN reaction that required hospitalization: No Has patient had a PCN reaction occurring within the last 10 years: No If all of the above answers are "NO", then may proceed with Cephalosporin use.   Sulfasalazine Swelling   Throat swelling  Throat swelling    Hydrocodone Nausea And Vomiting        Medication List        Accurate as of December 03, 2020  1:03 PM. If you have any questions, ask your nurse or doctor.          STOP taking these medications    ACIDOPHILUS PO Stopped by: Valentina Shaggy, MD   APPLE CIDER VINEGAR DIET PO Stopped by: Valentina Shaggy, MD   butalbital-aspirin-caffeine [phone] MG capsule Commonly known as: Acquanetta Chain Stopped by: Valentina Shaggy, MD   CALCIUM 1200+D3 PO Stopped by: Valentina Shaggy, MD   magnesium oxide 400 MG tablet Commonly known as: MAG-OX Stopped by: Valentina Shaggy, MD   spironolactone 25 MG tablet Commonly known as: ALDACTONE Stopped by: Valentina Shaggy, MD       TAKE these medications    Aimovig 140 MG/ML Soaj Generic drug: Erenumab-aooe 1 (ONE) SOLUTION AUTO INJECTOR MONTHLY   Ajovy 225 MG/1.5ML Soaj Generic drug: Fremanezumab-vfrm Inject into the skin.   albuterol 108 (90 Base) MCG/ACT  inhaler Commonly known as: VENTOLIN HFA SMARTSIG:1 Puff(s) Via Inhaler Every 4 Hours PRN   ascorbic Acid 500 MG Cpcr Commonly known as: VITAMIN C See admin instructions.   azelastine 0.1 % nasal spray Commonly known as: ASTELIN Place 1 spray into both nostrils 2 (two) times daily. Use in each nostril as directed Started by: Valentina Shaggy, MD   B-12 1000 MCG/ML Kit Inject 1 mL as directed every 30 (thirty) days. What changed: Another medication with the same name was removed. Continue taking this medication, and follow the directions you see here. Changed by: Valentina Shaggy, MD   bisacodyl 5 MG EC tablet Commonly known as: DULCOLAX Take by mouth.   budesonide-formoterol 160-4.5 MCG/ACT inhaler Commonly known as: Symbicort Inhale 2 puffs into the lungs 2 (two) times daily. 2 puffs twice daily with spacer. Started by: Valentina Shaggy, MD   cyclobenzaprine 10 MG tablet Commonly known as: FLEXERIL Take 10 mg by mouth 2 (two) times daily.   DULoxetine 60 MG capsule Commonly known as: CYMBALTA Take 60 mg by mouth daily.   DULoxetine 30 MG capsule Commonly known as: CYMBALTA Take 1 capsule by mouth daily.   EPINEPHrine 0.3 mg/0.3 mL Soaj injection Commonly known as: EPI-PEN Inject 0.3 mg into the muscle daily as needed (allergic reaction).   famotidine 40 MG tablet Commonly known as: PEPCID TAKE 1 TABLET BY MOUTH EVERYDAY AT BEDTIME   gabapentin 100 MG capsule Commonly known as: NEURONTIN Take 100 mg by mouth 3 (three) times daily.   hydrocortisone 2.5 % ointment Apply topically 2 (two) times daily.   hydrOXYzine 25 MG tablet Commonly known as: ATARAX/VISTARIL Take 25 mg by mouth 4 (four) times daily as needed.   lidocaine 5 % Commonly known as: LIDODERM SMARTSIG:Topical   meclizine 25 MG tablet Commonly known as: ANTIVERT Take 1 tablet (25 mg total) by mouth 3 (three) times daily as needed for dizziness.   metoprolol tartrate 25 MG  tablet Commonly known as: LOPRESSOR TAKE 1 TABLET BY MOUTH TWICE A DAY   metroNIDAZOLE 0.75 % cream Commonly known as: METROCREAM Apply topically 2 (two) times daily.   mirabegron ER 50 MG Tb24 tablet Commonly known as: MYRBETRIQ Take 1 tablet by mouth daily.   mometasone 50 MCG/ACT nasal spray Commonly known as: NASONEX Place 1 spray into the nose 2 (two) times daily. 1 spray in each nostril twice a day. Started by: Valentina Shaggy, MD   montelukast 10 MG tablet Commonly known as: SINGULAIR Take 1 tablet by mouth daily.   Movantik 25 MG Tabs tablet Generic drug: naloxegol  oxalate TAKE 1 TABLET BY MOUTH EVERY DAY IN THE MORNING   omeprazole 40 MG capsule Commonly known as: PRILOSEC Take 40 mg by mouth 2 (two) times daily.   oxyCODONE 15 MG immediate release tablet Commonly known as: ROXICODONE Take 15 mg by mouth every 8 (eight) hours as needed for severe pain.   polyethylene glycol 17 g packet Commonly known as: MIRALAX / GLYCOLAX Take 17 g by mouth daily.   potassium chloride SA 20 MEQ tablet Commonly known as: KLOR-CON Take 20 mEq by mouth daily.   promethazine 25 MG tablet Commonly known as: PHENERGAN Take 25 mg by mouth every 6 (six) hours as needed for nausea or vomiting.   sodium fluoride 1.1 % Crea dental cream Commonly known as: PREVIDENT 5000 PLUS USE DAILY ON TOOTHBRUSH AFTER REGULAR BRUSHING & FLOSSING DON TEAT OR DRINK FOR 30 MINUTES AFTER USE   tamsulosin 0.4 MG Caps capsule Commonly known as: FLOMAX Take 0.4 mg by mouth daily.   triamcinolone cream 0.1 % Commonly known as: KENALOG Apply 1 application topically 2 (two) times daily.   Ubrogepant 100 MG Tabs Take 1 tablet at the onset of migraine and may repeat in 1 hour. Max 200 mg   VITAMIN D PO Take 5,000 Units by mouth daily.   vitamin E 180 MG (400 UNITS) capsule Take 400 Units by mouth daily.        Birth History: non-contributory  Developmental History:  non-contributory  Past Surgical History: Past Surgical History:  Procedure Laterality Date   BRAIN SURGERY  11/2017   BREAST CYST EXCISION     lanced    CARDIOVASCULAR STRESS TEST  2011   with IV    COLONOSCOPY W/ BIOPSIES AND POLYPECTOMY     HEMORROIDECTOMY     IR 3D INDEPENDENT WKST  11/23/2017   IR ANGIO INTRA EXTRACRAN SEL COM CAROTID INNOMINATE UNI L MOD SED  11/23/2017   IR ANGIO INTRA EXTRACRAN SEL INTERNAL CAROTID UNI R MOD SED  11/23/2017   IR ANGIO VERTEBRAL SEL SUBCLAVIAN INNOMINATE UNI R MOD SED  11/23/2017   IR ANGIO VERTEBRAL SEL VERTEBRAL UNI L MOD SED  11/23/2017   IR ANGIOGRAM FOLLOW UP STUDY  11/23/2017   IR RADIOLOGIST EVAL & MGMT  11/09/2017   IR TRANSCATH/EMBOLIZ  11/23/2017   KIDNEY STONE SURGERY     RADIOLOGY WITH ANESTHESIA N/A 11/23/2017   Procedure: EMBOLIZATION;  Surgeon: Luanne Bras, MD;  Location: [facility];  Service: Radiology;  Laterality: N/A;   TOTAL ABDOMINAL HYSTERECTOMY     URETHRA SURGERY       Family History: Family History  Problem Relation Age of Onset   Cancer Mother    Depression Mother    Valvular heart disease Mother        mitral valve reflux   Other Father        degenerative bone    Colon cancer Maternal Grandmother    Stroke Maternal Grandfather    Colon cancer Paternal Grandmother    Thyroid disease Daughter    Basal cell carcinoma Daughter      Social History: [facility] lives at home with her roommates.  Evidently, she lives with a married couple his house is too large.  She lives in part of the house and the couple lives in the rest of the house.  She smokes around 10 cigarettes/day.  This goes up and down, but she has been a smoker for a while.  She is on disability.  She has  several children.  1 daughter lives in [place] and a son lives nearby as well in [place].  She is a daughter who lives in [place] as a Geologist, engineering.   Review of Systems  Constitutional: Negative.  Negative for fever, malaise/fatigue and weight loss.   HENT:  Positive for congestion. Negative for ear discharge and ear pain.   Eyes:  Negative for pain, discharge and redness.  Respiratory:  Positive for cough and shortness of breath. Negative for sputum production and wheezing.   Cardiovascular: Negative.  Negative for chest pain and palpitations.  Gastrointestinal:  Negative for abdominal pain, heartburn, nausea and vomiting.  Skin: Negative.  Negative for itching and rash.  Neurological:  Negative for dizziness and headaches.  Endo/Heme/Allergies:  Negative for environmental allergies. Does not bruise/bleed easily.      Objective:   Blood pressure 106/78, pulse 77, temperature 98.7 F (37.1 C), temperature source Temporal, resp. rate 16, height '5\' 2"'  (1.575 m), weight 113 lb 6.4 oz (51.4 kg), SpO2 98 %. Body mass index is 20.74 kg/m.   Physical Exam:   Physical Exam Vitals reviewed.  Constitutional:      Appearance: She is well-developed.  HENT:     Head: Normocephalic and atraumatic.     Right Ear: Tympanic membrane, ear canal and external ear normal. No drainage, swelling or tenderness. Tympanic membrane is not injected, scarred, erythematous, retracted or bulging.     Left Ear: Tympanic membrane, ear canal and external ear normal. No drainage, swelling or tenderness. Tympanic membrane is not injected, scarred, erythematous, retracted or bulging.     Nose: No nasal deformity, septal deviation, mucosal edema or rhinorrhea.     Right Turbinates: Enlarged and swollen.     Left Turbinates: Enlarged and swollen.     Right Sinus: No maxillary sinus tenderness or frontal sinus tenderness.     Left Sinus: No maxillary sinus tenderness or frontal sinus tenderness.     Mouth/Throat:     Lips: Pink.     Mouth: Mucous membranes are moist. Mucous membranes are not pale and not dry.     Pharynx: Uvula midline.     Comments: Cobblestoning in the posterior oropharynx.  Eyes:     General:        Right eye: No discharge.        Left  eye: No discharge.     Conjunctiva/sclera: Conjunctivae normal.     Right eye: Right conjunctiva is not injected. No chemosis.    Left eye: Left conjunctiva is not injected. No chemosis.    Pupils: Pupils are equal, round, and reactive to light.  Cardiovascular:     Rate and Rhythm: Normal rate and regular rhythm.     Heart sounds: Normal heart sounds.  Pulmonary:     Effort: Pulmonary effort is normal. No tachypnea, accessory muscle usage or respiratory distress.     Breath sounds: Normal breath sounds. No wheezing, rhonchi or rales.     Comments: There is coarse upper airway sounds throughout.  Chest:     Chest wall: No tenderness.  Abdominal:     Tenderness: There is no abdominal tenderness. There is no guarding or rebound.  Lymphadenopathy:     Head:     Right side of head: No submandibular, tonsillar or occipital adenopathy.     Left side of head: No submandibular, tonsillar or occipital adenopathy.     Cervical: No cervical adenopathy.  Skin:    General: Skin is warm.  Capillary Refill: Capillary refill takes less than 2 seconds.     Coloration: Skin is not pale.     Findings: No abrasion, erythema, petechiae or rash. Rash is not papular, urticarial or vesicular.     Comments: Very thin skin.  Multiple bruises and lesions.  Neurological:     Mental Status: She is alert.  Psychiatric:        Behavior: Behavior is cooperative.     Diagnostic studies:    Spirometry: results normal (FEV1: 1.70/72%, FVC: 2.28/76%, FEV1/FVC: 75%).    Spirometry consistent with normal pattern. Xopenex four puffs via MDI treatment given in clinic with no improvement.  Allergy Studies:     Airborne Adult Perc - 12/03/20 1057     Time Antigen Placed 1045    Allergen Manufacturer Lavella Hammock    Location Back    Number of Test 59    1. Control-Buffer 50% Glycerol Negative    2. Control-Histamine 1 mg/ml 2+    3. Albumin saline Negative    4. Laton Negative    5. [place] Negative    6.  Johnson Negative    7. Calamus Blue Negative    8. Meadow Fescue Negative    9. Perennial Rye Negative    10. Sweet Vernal Negative    11. Timothy Negative    12. Cocklebur Negative    13. Burweed Marshelder Negative    14. Ragweed, short Negative    15. Ragweed, Giant Negative    16. Plantain,  English Negative    17. Lamb's Quarters Negative    18. Sheep Sorrell Negative    19. Rough Pigweed Negative    20. Marsh Elder, Rough Negative    21. Mugwort, Common Negative    22. Ash mix Negative    23. Birch mix Negative    24. Beech American Negative    25. Box, Elder Negative    26. Cedar, red Negative    27. Cottonwood, [place] Negative    28. Elm mix Negative    29. Hickory Negative    30. Maple mix Negative    31. Oak, [place] mix Negative    32. Pecan Pollen Negative    33. Pine mix Negative    34. Sycamore Eastern Negative    35. Pump Back, Black Pollen Negative    36. Alternaria alternata Negative    37. Cladosporium Herbarum Negative    38. Aspergillus mix Negative    39. Penicillium mix Negative    40. Bipolaris sorokiniana (Helminthosporium) Negative    41. Drechslera spicifera (Curvularia) Negative    42. Mucor plumbeus Negative    43. Fusarium moniliforme Negative    44. Aureobasidium pullulans (pullulara) Negative    45. Rhizopus oryzae Negative    46. Botrytis cinera Negative    47. Epicoccum nigrum Negative    48. Phoma betae Negative    49. Candida Albicans Negative    50. Trichophyton mentagrophytes Negative    51. Mite, D Farinae  5,000 AU/ml Negative    52. Mite, D Pteronyssinus  5,000 AU/ml Negative    53. Cat Hair 10,000 BAU/ml Negative    54.  Dog Epithelia Negative    55. Mixed Feathers Negative    56. Horse Epithelia Negative    57. Cockroach, German Negative    58. Mouse Negative    59. Tobacco Leaf Negative             Food Perc - 12/03/20 1058  Test Information   Time Antigen Placed 9379    Allergen Manufacturer Tax     Location Back    Number of allergen test 10      Food   1. Peanut Negative    2. Soybean food Negative    3. Wheat, whole Negative    4. Sesame Negative    5. Milk, cow Negative    6. Egg White, chicken Negative    7. Casein Negative    8. Shellfish mix Negative    9. Fish mix Negative    10. Cashew Negative             Intradermal - 12/03/20 1124     Time Antigen Placed 1125    Allergen Manufacturer Lavella Hammock    Location Arm    Number of Test 15    Control Negative    Guatem[place] Negative    Johnson Negative    7 Grass Negative    Ragweed mix Negative    Weed mix Negative    Tree mix Negative    Mold 1 Negative    Mold 2 Negative    Mold 3 Negative    Mold 4 Negative    Cat Negative    Dog Negative    Cockroach Negative    Mite mix Negative             Allergy testing results were read and interpreted by myself, documented by clinical staff.         Salvatore Marvel, MD [facility]

## 2020-12-04 ENCOUNTER — Telehealth: Payer: Self-pay | Admitting: Allergy & Immunology

## 2020-12-04 NOTE — Telephone Encounter (Signed)
Patient was seen yesterday and states she was negative for all allergy testing done, but when she got home she had a rash on her back, under her left shoulder blade, right under her bra line. Patient states the rash has now spread to her side and it does itch a little. Patient has been applying hydrocortisone cream.   Also patient does not know if she should stop or continue her Singulair.  Please advise.

## 2020-12-04 NOTE — Telephone Encounter (Signed)
Try to continue the Singulair through the weekend to see if that helps at all.  If she feels no different on Monday, she can.  Lets send in a stronger steroid ointment such as triamcinolone 0.1% ointment applied twice daily as needed.  You can send in the jar.

## 2020-12-07 NOTE — Telephone Encounter (Signed)
Spoke with pt rash is still there but its not itching or bothering her. She is doing montelukast.

## 2020-12-07 NOTE — Telephone Encounter (Signed)
Sorry that last note from Friday should have said: "Try to continue the Singulair through the weekend to see if that helps at all.  If she feels no different on Monday, she can STOP the medication."  Thanks, Malachi Bonds, MD [facility]

## 2020-12-08 NOTE — Telephone Encounter (Signed)
Please ask her to take pictures for the next visit.  Did we send in the triamcinolone 0.1% applied twice daily as needed?  Malachi Bonds, MD [facility] [place]

## 2020-12-10 ENCOUNTER — Other Ambulatory Visit: Payer: Self-pay

## 2020-12-10 MED ORDER — TRIAMCINOLONE ACETONIDE 0.1 % EX OINT: 1.0000 "application " | TOPICAL_OINTMENT | Freq: Two times a day (BID) | CUTANEOUS | 0 refills | Status: AC

## 2020-12-10 NOTE — Telephone Encounter (Signed)
Sent in triamcinolone and tried to call pt but her voicemail box is full currently

## 2021-01-18 ENCOUNTER — Ambulatory Visit: Payer: Medicare HMO | Admitting: Allergy & Immunology

## 2021-01-18 ENCOUNTER — Other Ambulatory Visit: Payer: Self-pay

## 2021-01-18 ENCOUNTER — Encounter: Payer: Self-pay | Admitting: Allergy & Immunology

## 2021-01-18 VITALS — BP 118/74 | HR 65 | Temp 97.1°F | Resp 20 | Ht 62.0 in | Wt 113.0 lb

## 2021-01-18 DIAGNOSIS — R0602 Shortness of breath: Secondary | ICD-10-CM | POA: Diagnosis not present

## 2021-01-18 DIAGNOSIS — J310 Chronic rhinitis: Secondary | ICD-10-CM

## 2021-01-18 MED ORDER — IPRATROPIUM BROMIDE 0.03 % NA SOLN: NASAL | 5 refills | Status: DC

## 2021-01-18 NOTE — Progress Notes (Signed)
FOLLOW UP  Date of Service/Encounter:  01/18/21   Assessment:   SOB (shortness of breath) - ? COPD with asthma overlap   Current smoker   Chronic rhinitis - with negative testing to the entire panel   Migraine - controlled with Ajovy   Complicated past medical history, including depression, GERD, CVA, and fibromyalgia    Plan/Recommendations:   1. Moderate persistent asthma, uncomplicated - Lung testing looked great today.  - Spacer use reviewed. - Daily controller medication(s): Symbicort 160/4.52mcg two puffs twice daily with spacer - Prior to physical activity: albuterol 2 puffs 10-15 minutes before physical activity. - Rescue medications: albuterol 4 puffs every 4-6 hours as needed - Asthma control goals:  * Full participation in all desired activities (may need albuterol before activity) * Albuterol use two time or less a week on average (not counting use with activity) * Cough interfering with sleep two time or less a month * Oral steroids no more than once a year * No hospitalizations  2. Chronic non-allergic rhinitis - Stop the mometasone and the azelastine nasal sprays. - Continue with: Zyrtec (cetirizine) 10mg  tablet once daily - Start taking: nasal ipratropium one spray per nostril up to three times daily (this can be OVER drying, so be careful). - Call if this not working after a couple of weeks.  - We may send you to see ENT if there is no improvement this time.   3. Return in about 3 months (around 04/20/2021).    Subjective:   Kara Ochoa is a 60 y.o. female presenting today for follow up of  Chief Complaint  Patient presents with   Follow-up    No major improvement since LOV.   Nasal Congestion    No improvement with post nasal congestion   Cough    Slight improvement in the coughing.   Breathing Problem    SOB    Kara Ochoa has a history of the following: Patient Active Problem List   Diagnosis Date Noted   Paroxysmal SVT  (supraventricular tachycardia) (HCC) 02/25/2019   Essential hypertension 02/25/2019   SOB (shortness of breath)    Migraines    CVA (cerebral vascular accident) (HCC)    SVT (supraventricular tachycardia) (HCC) 01/16/2019   Brain aneurysm 11/23/2017   Chest pain 12/07/2016   Palpitations 12/07/2016   Shortness of breath 12/07/2016   Murmur 12/07/2016   Leg edema 06/30/2011    History obtained from: chart review and patient.  Kara Ochoa is a 60 y.o. female presenting for a follow up visit.  She was last seen in June 2022.  At that time, her lung testing was largely normal but it did improve with the albuterol treatment.  We started her on Symbicort 160/4.5 mcg 2 puffs twice daily and continued with albuterol as needed.  She had environmental allergy testing that was negative to the entire panel.  We continued with Zyrtec and added Nasonex and Astelin.  Since the last visit, she has mostly done well.   Asthma/Respiratory Symptom History: She has been on the Symbicort. She reports that she has a problem with a sore throat since the last visit. She did purchase a mouth wash for the dry mouth. She does report that her SOB is better compared to the last visit.  She continues to smoke around 10 cigarettes/day.  Allergic Rhinitis Symptom History: She reports that the drainage is particularly bad and it tends to cause her to choke. She is using the mometasone and the azelastine  one spray per nostril of each twice daily. This might have helped some, but she still has the same problem. The Nasacort was working in the past and the Flonase did not work.  She does report some nose bleeds but they are not severe.  She did get a HEPA filter for the entire house, which has helped.  She also has a UV air purifier in her bedroom which has helped quite a bit.  Otherwise, there have been no changes to her past medical history, surgical history, family history, or social history.    Review of Systems   Constitutional: Negative.  Negative for fever, malaise/fatigue and weight loss.  HENT:  Positive for congestion and sinus pain. Negative for ear discharge and ear pain.   Eyes:  Negative for pain, discharge and redness.  Respiratory:  Positive for shortness of breath. Negative for cough, sputum production and wheezing.   Cardiovascular: Negative.  Negative for chest pain and palpitations.  Gastrointestinal:  Negative for abdominal pain, constipation, diarrhea, heartburn, nausea and vomiting.  Skin: Negative.  Negative for itching and rash.  Neurological:  Negative for dizziness and headaches.  Endo/Heme/Allergies:  Negative for environmental allergies. Does not bruise/bleed easily.      Objective:   Blood pressure 118/74, pulse 65, temperature (!) 97.1 F (36.2 C), temperature source Temporal, resp. rate 20, height 5\' 2"  (1.575 m), weight 113 lb (51.3 kg), SpO2 98 %. Body mass index is 20.67 kg/m.   Physical Exam:  Physical Exam Vitals reviewed.  Constitutional:      Appearance: She is well-developed.  HENT:     Head: Normocephalic and atraumatic.     Right Ear: Tympanic membrane, ear canal and external ear normal.     Left Ear: Tympanic membrane, ear canal and external ear normal.     Nose: Septal deviation present. No nasal deformity, mucosal edema or rhinorrhea.     Right Turbinates: Not enlarged or swollen.     Left Turbinates: Not enlarged or swollen.     Right Sinus: No maxillary sinus tenderness or frontal sinus tenderness.     Left Sinus: No maxillary sinus tenderness or frontal sinus tenderness.     Mouth/Throat:     Mouth: Mucous membranes are not pale and not dry.     Pharynx: Uvula midline.  Eyes:     General: Lids are normal. No allergic shiner.       Right eye: No discharge.        Left eye: No discharge.     Conjunctiva/sclera: Conjunctivae normal.     Right eye: Right conjunctiva is not injected. No chemosis.    Left eye: Left conjunctiva is not injected.  No chemosis.    Pupils: Pupils are equal, round, and reactive to light.  Cardiovascular:     Rate and Rhythm: Normal rate and regular rhythm.     Heart sounds: Normal heart sounds.  Pulmonary:     Effort: Pulmonary effort is normal. No tachypnea, accessory muscle usage or respiratory distress.     Breath sounds: Normal breath sounds. No wheezing, rhonchi or rales.  Chest:     Chest wall: No tenderness.  Lymphadenopathy:     Cervical: No cervical adenopathy.  Skin:    General: Skin is warm.     Capillary Refill: Capillary refill takes less than 2 seconds.     Coloration: Skin is not pale.     Findings: No abrasion, erythema, petechiae or rash. Rash is not papular, urticarial or vesicular.  Comments: Multiple lesions over the entire body.   Neurological:     Mental Status: She is alert.  Psychiatric:        Behavior: Behavior is cooperative.     Diagnostic studies:    Spirometry: results normal (FEV1: 1.72/73%, FVC: 2.25/75%, FEV1/FVC: 76%).   Spirometry consistent with normal pattern.     Allergy Studies: none        Malachi Bonds, MD  [facility]         /

## 2021-01-18 NOTE — Patient Instructions (Addendum)
1. Moderate persistent asthma, uncomplicated - Lung testing looked great today.  - Spacer use reviewed. - Daily controller medication(s): Symbicort 160/4.57mcg two puffs twice daily with spacer - Prior to physical activity: albuterol 2 puffs 10-15 minutes before physical activity. - Rescue medications: albuterol 4 puffs every 4-6 hours as needed - Asthma control goals:  * Full participation in all desired activities (may need albuterol before activity) * Albuterol use two time or less a week on average (not counting use with activity) * Cough interfering with sleep two time or less a month * Oral steroids no more than once a year * No hospitalizations  2. Chronic non-allergic rhinitis - Stop the mometasone and the azelastine nasal sprays. - Continue with: Zyrtec (cetirizine) 10mg  tablet once daily - Start taking: nasal ipratropium one spray per nostril up to three times daily (this can be OVER drying, so be careful). - Call if this not working after a couple of weeks.  - We may send you to see ENT if there is no improvement this time.   3. Return in about 3 months (around 04/20/2021).    Please inform 13/01/2021 of any Emergency Department visits, hospitalizations, or changes in symptoms. Call us before going to the ED for breathing or allergy symptoms since we might be able to fit you in for a sick visit. Feel free to contact us anytime with any questions, problems, or concerns.  It was a pleasure to see you again today!  Websites that have reliable patient information: 1. American Academy of Asthma, Allergy, and Immunology: www.aaaai.org 2. Food Allergy Research and Education (FARE): foodallergy.org 3. Mothers of Asthmatics: http://www.asthmacommunitynetwork.org 4. American College of Allergy, Asthma, and Immunology: www.acaai.org   COVID-19 Vaccine Information can be found at: [place] For questions related to vaccine  distribution or appointments, please email [email or web address] or call [phone].   We realize that you might be concerned about having an allergic reaction to the COVID19 vaccines. To help with that concern, WE ARE OFFERING THE COVID19 VACCINES IN OUR OFFICE! Ask the front desk for dates!     "Like" [phone] on Facebook and Instagram for our latest updates!      A healthy democracy works best when [place] participate! Make sure you are registered to vote! If you have moved or changed any of your contact information, you will need to get this updated before voting!  In some cases, you MAY be able to register to vote online: [employer]

## 2021-01-19 ENCOUNTER — Telehealth: Payer: Self-pay | Admitting: Allergy & Immunology

## 2021-01-19 MED ORDER — IPRATROPIUM BROMIDE 0.03 % NA SOLN: NASAL | 5 refills | Status: AC

## 2021-01-19 NOTE — Telephone Encounter (Signed)
Patient stating RX for ipratropium (ATROVENT) 0.03 % nasal spray [[id number]] Was sent to the wrong pharmacy please send to [facility] in [place]

## 2021-01-19 NOTE — Telephone Encounter (Signed)
Atrovent nasal spray resent to [facility].

## 2021-02-22 ENCOUNTER — Ambulatory Visit: Admit: 2021-02-22 | Discharge: 2021-02-23 | Payer: MEDICARE

## 2021-02-22 DIAGNOSIS — Z85828 Personal history of other malignant neoplasm of skin: Principal | ICD-10-CM

## 2021-02-22 DIAGNOSIS — D492 Neoplasm of unspecified behavior of bone, soft tissue, and skin: Principal | ICD-10-CM

## 2021-02-22 DIAGNOSIS — L821 Other seborrheic keratosis: Principal | ICD-10-CM

## 2021-02-22 DIAGNOSIS — S41122S Laceration with foreign body of left upper arm, sequela: Principal | ICD-10-CM

## 2021-02-25 ENCOUNTER — Telehealth: Payer: Self-pay | Admitting: Allergy & Immunology

## 2021-02-25 NOTE — Telephone Encounter (Signed)
Patient states Dr. Dellis Anes sent in an RX for singular and she started having nightmares so her PCP told her to stop taking this medication Patient is asking for an alternate medication please advise

## 2021-02-26 NOTE — Telephone Encounter (Signed)
Spoke with pt and situation was addressed in detail with pt. Pt verbalized understanding. Pt was advise to call office with any changes or concerns.

## 2021-02-26 NOTE — Telephone Encounter (Signed)
Let's just keep it off and see how she does. Maybe she does not need another medication.  Malachi Bonds, MD [facility]

## 2021-02-26 NOTE — Telephone Encounter (Signed)
Dr Dellis Anes please advise:  Patient states Dr. Dellis Anes sent in an RX for singular and she started having nightmares so her PCP told her to stop taking this medication Patient is asking for an alternate medication please advise

## 2021-04-28 ENCOUNTER — Ambulatory Visit: Payer: Medicare HMO | Admitting: Allergy

## 2021-04-28 NOTE — Progress Notes (Deleted)
Follow Up Note  RE: Kara Ochoa MRN: [id number] DOB: 10-31-1960 Date of Office Visit: 04/28/2021  Referring provider: Bonnita Nasuti, MD Primary care provider: Bonnita Nasuti, MD  Chief Complaint: No chief complaint on file.  History of Present Illness: I had the pleasure of seeing Kara Ochoa for a follow up visit at the [facility] on 04/28/2021. She is a 60 y.o. female, who is being followed for asthma, nonallergic rhinitis. Her previous allergy office visit was on 01/18/2021 with Dr. Ernst Bowler. Today is a regular follow up visit.  Did not tolerate singulair.  1. Moderate persistent asthma, uncomplicated - Lung testing looked great today.  - Spacer use reviewed. - Daily controller medication(s): Symbicort 160/4.77mg two puffs twice daily with spacer - Prior to physical activity: albuterol 2 puffs 10-15 minutes before physical activity. - Rescue medications: albuterol 4 puffs every 4-6 hours as needed - Asthma control goals:  * Full participation in all desired activities (may need albuterol before activity) * Albuterol use two time or less a week on average (not counting use with activity) * Cough interfering with sleep two time or less a month * Oral steroids no more than once a year * No hospitalizations   2. Chronic non-allergic rhinitis - Stop the mometasone and the azelastine nasal sprays. - Continue with: Zyrtec (cetirizine) 164mtablet once daily - Start taking: nasal ipratropium one spray per nostril up to three times daily (this can be OVER drying, so be careful). - Call us[place]oreaf this not working after a couple of weeks.  - We may send you to see ENT if there is no improvement this time.   Assessment and Plan: Kara Ochoa a [age].o. female with: No problem-specific Assessment & Plan notes found for this encounter.  No follow-ups on file.  No orders of the defined types were placed in this encounter.  Lab Orders  No laboratory test(s) ordered  today    Diagnostics: Spirometry:  Tracings reviewed. Her effort: {Blank single:19197::"Good reproducible efforts.","It was hard to get consistent efforts and there is a question as to whether this reflects a maximal maneuver.","Poor effort, data can not be interpreted."} FVC: ***L FEV1: ***L, ***% predicted FEV1/FVC ratio: ***% Interpretation: {Blank single:19197::"Spirometry consistent with mild obstructive disease","Spirometry consistent with moderate obstructive disease","Spirometry consistent with severe obstructive disease","Spirometry consistent with possible restrictive disease","Spirometry consistent with mixed obstructive and restrictive disease","Spirometry uninterpretable due to technique","Spirometry consistent with normal pattern","No overt abnormalities noted given today's efforts"}.  Please see scanned spirometry results for details.  Skin Testing: {Blank single:19197::"Select foods","Environmental allergy panel","Environmental allergy panel and select foods","Food allergy panel","None","Deferred due to recent antihistamines use"}. *** Results discussed with patient/family.   Medication List:  Current Outpatient Medications  Medication Sig Dispense Refill   AJOVY 225 MG/1.5ML SOAJ Inject into the skin.     albuterol (VENTOLIN HFA) 108 (90 Base) MCG/ACT inhaler SMARTSIG:1 Puff(s) Via Inhaler Every 4 Hours PRN     ascorbic Acid (VITAMIN C) 500 MG CPCR See admin instructions.     azelastine (ASTELIN) 0.1 % nasal spray Place 1 spray into both nostrils 2 (two) times daily. Use in each nostril as directed 30 mL 5   bisacodyl (DULCOLAX) 5 MG EC tablet Take by mouth.     budesonide-formoterol (SYMBICORT) 160-4.5 MCG/ACT inhaler Inhale 2 puffs into the lungs 2 (two) times daily. 2 puffs twice daily with spacer. 1 each 6   Cholecalciferol (VITAMIN D PO) Take 5,000 Units by mouth daily.  Cyanocobalamin (B-12) 1000 MCG/ML KIT Inject 1 mL as directed every 30 (thirty) days.      cyclobenzaprine (FLEXERIL) 10 MG tablet Take 10 mg by mouth 2 (two) times daily.      DULoxetine (CYMBALTA) 30 MG capsule Take 1 capsule by mouth daily.     DULoxetine (CYMBALTA) 60 MG capsule Take 60 mg by mouth daily.     EPINEPHrine 0.3 mg/0.3 mL IJ SOAJ injection Inject 0.3 mg into the muscle daily as needed (allergic reaction).      Erenumab-aooe (AIMOVIG) 140 MG/ML SOAJ 1 (ONE) SOLUTION AUTO INJECTOR MONTHLY     famotidine (PEPCID) 40 MG tablet TAKE 1 TABLET BY MOUTH EVERYDAY AT BEDTIME     gabapentin (NEURONTIN) 100 MG capsule Take 100 mg by mouth 3 (three) times daily.     hydrocortisone 2.5 % ointment Apply topically 2 (two) times daily.     hydrOXYzine (ATARAX/VISTARIL) 25 MG tablet Take 25 mg by mouth 4 (four) times daily as needed.     ipratropium (ATROVENT) 0.03 % nasal spray one spray per nostril up to three times daily (this can be OVER drying, so be careful). 30 mL 5   lidocaine (LIDODERM) 5 % SMARTSIG:Topical     meclizine (ANTIVERT) 25 MG tablet Take 1 tablet (25 mg total) by mouth 3 (three) times daily as needed for dizziness. 30 tablet 0   metoprolol tartrate (LOPRESSOR) 25 MG tablet TAKE 1 TABLET BY MOUTH TWICE A DAY 180 tablet 0   metroNIDAZOLE (METROCREAM) 0.75 % cream Apply topically 2 (two) times daily.     mirabegron ER (MYRBETRIQ) 50 MG TB24 tablet Take 1 tablet by mouth daily.     mometasone (NASONEX) 50 MCG/ACT nasal spray Place 1 spray into the nose 2 (two) times daily. 1 spray in each nostril twice a day. 1 each 5   montelukast (SINGULAIR) 10 MG tablet Take 1 tablet by mouth daily.     MOVANTIK 25 MG TABS tablet TAKE 1 TABLET BY MOUTH EVERY DAY IN THE MORNING     omeprazole (PRILOSEC) 40 MG capsule Take 40 mg by mouth 2 (two) times daily.     oxyCODONE (ROXICODONE) 15 MG immediate release tablet Take 15 mg by mouth every 8 (eight) hours as needed for severe pain.  0   polyethylene glycol (MIRALAX / GLYCOLAX) 17 g packet Take 17 g by mouth daily.     potassium  chloride SA (KLOR-CON) 20 MEQ tablet Take 20 mEq by mouth daily.     promethazine (PHENERGAN) 25 MG tablet Take 25 mg by mouth every 6 (six) hours as needed for nausea or vomiting.     sodium fluoride (PREVIDENT 5000 PLUS) 1.1 % CREA dental cream USE DAILY ON TOOTHBRUSH AFTER REGULAR BRUSHING & FLOSSING DON TEAT OR DRINK FOR 30 MINUTES AFTER USE     tamsulosin (FLOMAX) 0.4 MG CAPS capsule Take 0.4 mg by mouth daily.     triamcinolone cream (KENALOG) 0.1 % Apply 1 application topically 2 (two) times daily.     triamcinolone ointment (KENALOG) 0.1 % Apply 1 application topically 2 (two) times daily. 30 g 0   Ubrogepant 100 MG TABS Take 1 tablet at the onset of migraine and may repeat in 1 hour. Max 200 mg     vitamin E 400 UNIT capsule Take 400 Units by mouth daily.     No current facility-administered medications for this visit.   Allergies: Allergies  Allergen Reactions   Bee Venom Anaphylaxis   Plavix [  Clopidogrel Bisulfate] Shortness Of Breath and Other (See Comments)    QUESTIONABLE ASSOCIATION WITH SYMPTOMS Over the course of a week it made her sore throat and difficulty breathing, believes throat was slowly swelling. Starting a z-pack today 11/17/17 to see if sore throat subsides.   Sausage [Pickled Meat] Swelling    Lips, legs, arms swell   Sulfa Antibiotics Anaphylaxis and Shortness Of Breath    Other reaction(s): Angioedema (ALLERGY/intolerance)   Sulfa Drugs Cross Reactors Anaphylaxis   Zonisamide Anaphylaxis and Other (See Comments)    Headache medication   Carisoprodol Rash   Codeine Hives   Penicillins Hives and Other (See Comments)    Has patient had a PCN reaction causing immediate rash, facial/tongue/throat swelling, SOB or lightheadedness with hypotension: Unknown Has patient had a PCN reaction causing severe rash involving mucus membranes or skin necrosis: No Has patient had a PCN reaction that required hospitalization: No Has patient had a PCN reaction occurring  within the last 10 years: No If all of the above answers are "NO", then may proceed with Cephalosporin use.    Sulfasalazine Swelling    Throat swelling  Throat swelling    Hydrocodone Nausea And Vomiting   I reviewed her past medical history, social history, family history, and environmental history and no significant changes have been reported from her previous visit.  Review of Systems  Constitutional:  Negative for appetite change, chills, fever and unexpected weight change.  HENT:  Negative for congestion and rhinorrhea.   Eyes:  Negative for itching.  Respiratory:  Negative for cough, chest tightness, shortness of breath and wheezing.   Gastrointestinal:  Negative for abdominal pain.  Skin:  Negative for rash.  Neurological:  Negative for headaches.   Objective: There were no vitals taken for this visit. There is no height or weight on file to calculate BMI. Physical Exam Vitals and nursing note reviewed.  Constitutional:      Appearance: Normal appearance. She is well-developed.  HENT:     Head: Normocephalic and atraumatic.     Right Ear: Tympanic membrane and external ear normal.     Left Ear: Tympanic membrane and external ear normal.     Nose: Nose normal.     Mouth/Throat:     Mouth: Mucous membranes are moist.     Pharynx: Oropharynx is clear.  Eyes:     Conjunctiva/sclera: Conjunctivae normal.  Cardiovascular:     Rate and Rhythm: Normal rate and regular rhythm.     Heart sounds: Normal heart sounds. No murmur heard. Pulmonary:     Effort: Pulmonary effort is normal.     Breath sounds: Normal breath sounds. No wheezing, rhonchi or rales.  Musculoskeletal:     Cervical back: Neck supple.  Skin:    General: Skin is warm.     Findings: No rash.  Neurological:     Mental Status: She is alert and oriented to person, place, and time.  Psychiatric:        Behavior: Behavior normal.   Previous notes and tests were reviewed. The plan was reviewed with the  patient/family, and all questions/concerned were addressed.  It was my pleasure to see Kara Ochoa today and participate in her care. Please feel free to contact me with any questions or concerns.  Sincerely,  Rexene Alberts, DO Allergy & Immunology  [facility] office: [place] office: [phone]

## 2021-05-11 NOTE — Patient Instructions (Incomplete)
1. Moderate persistent asthma, uncomplicated - Daily controller medication(s): Symbicort 160/4.71mcg two puffs twice daily with spacer - Prior to physical activity: albuterol 2 puffs 10-15 minutes before physical activity. - Rescue medications: albuterol 4 puffs every 4-6 hours as needed - Asthma control goals:  * Full participation in all desired activities (may need albuterol before activity) * Albuterol use two time or less a week on average (not counting use with activity) * Cough interfering with sleep two time or less a month * Oral steroids no more than once a year * No hospitalizations  2. Chronic non-allergic rhinitis - Continue with: Zyrtec (cetirizine) 10mg  tablet once daily - Start taking: nasal ipratropium one spray per nostril up to three times daily (this can be OVER drying, so be careful). - We may send you to see ENT if there is no improvement this time.   Please let know if this treatment plan is not working well for you. Schedule a follow up appointment in months

## 2021-05-13 ENCOUNTER — Ambulatory Visit: Payer: Medicare HMO | Admitting: Family

## 2021-05-17 NOTE — Patient Instructions (Incomplete)
1. Moderate persistent asthma, uncomplicated - Daily controller medication(s): Symbicort 160/4.71mcg two puffs twice daily with spacer - Prior to physical activity: albuterol 2 puffs 10-15 minutes before physical activity. - Rescue medications: albuterol 4 puffs every 4-6 hours as needed - Asthma control goals:  * Full participation in all desired activities (may need albuterol before activity) * Albuterol use two time or less a week on average (not counting use with activity) * Cough interfering with sleep two time or less a month * Oral steroids no more than once a year * No hospitalizations  2. Chronic non-allergic rhinitis - Continue with: Zyrtec (cetirizine) 10mg  tablet once daily - Start taking: nasal ipratropium one spray per nostril up to three times daily (this can be OVER drying, so be careful). - We may send you to see ENT if there is no improvement this time.   Please let know if this treatment plan is not working well for you. Schedule a follow up appointment in months

## 2021-05-18 ENCOUNTER — Ambulatory Visit: Payer: Medicare HMO | Admitting: Family

## 2021-05-24 ENCOUNTER — Ambulatory Visit
Admit: 2021-05-24 | Discharge: 2021-05-25 | Payer: MEDICARE | Attending: Student in an Organized Health Care Education/Training Program | Primary: Student in an Organized Health Care Education/Training Program

## 2021-05-24 DIAGNOSIS — L821 Other seborrheic keratosis: Principal | ICD-10-CM

## 2021-05-24 DIAGNOSIS — D492 Neoplasm of unspecified behavior of bone, soft tissue, and skin: Principal | ICD-10-CM

## 2021-05-24 DIAGNOSIS — D229 Melanocytic nevi, unspecified: Principal | ICD-10-CM

## 2021-05-24 DIAGNOSIS — Z85828 Personal history of other malignant neoplasm of skin: Principal | ICD-10-CM

## 2021-05-24 DIAGNOSIS — L820 Inflamed seborrheic keratosis: Principal | ICD-10-CM

## 2021-05-24 DIAGNOSIS — L570 Actinic keratosis: Principal | ICD-10-CM

## 2021-05-24 DIAGNOSIS — L814 Other melanin hyperpigmentation: Principal | ICD-10-CM

## 2021-05-24 DIAGNOSIS — D1801 Hemangioma of skin and subcutaneous tissue: Principal | ICD-10-CM

## 2021-07-19 ENCOUNTER — Ambulatory Visit
Admit: 2021-07-19 | Discharge: 2021-07-19 | Payer: MEDICARE | Attending: Student in an Organized Health Care Education/Training Program | Primary: Student in an Organized Health Care Education/Training Program

## 2021-07-19 DIAGNOSIS — L858 Other specified epidermal thickening: Principal | ICD-10-CM

## 2021-07-19 DIAGNOSIS — Z85828 Personal history of other malignant neoplasm of skin: Principal | ICD-10-CM

## 2021-07-19 MED ORDER — MUPIROCIN 2 % TOPICAL OINTMENT
Freq: Three times a day (TID) | TOPICAL | 1 refills | 0.00000 days | Status: CP
Start: 2021-07-19 — End: ?

## 2021-07-19 MED ORDER — TRIAMCINOLONE ACETONIDE 0.1 % TOPICAL OINTMENT
Freq: Two times a day (BID) | TOPICAL | 2 refills | 0.00000 days | Status: CP
Start: 2021-07-19 — End: 2022-07-19

## 2021-08-23 ENCOUNTER — Ambulatory Visit
Admit: 2021-08-23 | Discharge: 2021-08-24 | Payer: MEDICARE | Attending: Student in an Organized Health Care Education/Training Program | Primary: Student in an Organized Health Care Education/Training Program

## 2021-08-23 DIAGNOSIS — Z85828 Personal history of other malignant neoplasm of skin: Principal | ICD-10-CM

## 2021-08-23 DIAGNOSIS — D229 Melanocytic nevi, unspecified: Principal | ICD-10-CM

## 2021-08-23 DIAGNOSIS — L281 Prurigo nodularis: Principal | ICD-10-CM

## 2021-08-23 DIAGNOSIS — L309 Dermatitis, unspecified: Principal | ICD-10-CM

## 2021-08-23 DIAGNOSIS — D1801 Hemangioma of skin and subcutaneous tissue: Principal | ICD-10-CM

## 2021-08-23 DIAGNOSIS — L814 Other melanin hyperpigmentation: Principal | ICD-10-CM

## 2021-08-23 DIAGNOSIS — L821 Other seborrheic keratosis: Principal | ICD-10-CM

## 2021-08-23 DIAGNOSIS — D492 Neoplasm of unspecified behavior of bone, soft tissue, and skin: Principal | ICD-10-CM

## 2021-08-23 MED ORDER — DUPILUMAB 300 MG/2 ML SUBCUTANEOUS PEN INJECTOR
Freq: Once | SUBCUTANEOUS | 0 refills | 0.00000 days | Status: CP
Start: 2021-08-23 — End: 2021-08-23
  Filled 2021-08-30: qty 4, 14d supply, fill #0

## 2021-08-23 NOTE — Unmapped (Signed)
This encounter was created in error - please disregard.

## 2021-08-23 NOTE — Unmapped (Signed)
Dermatology Note     Assessment and Plan:      (1) Neoplasm Unsp x 1 (left shin- KA):     Risks, benefits and adverse effects of biopsy were reviewed.  After obtaining verbal consent, the biopsy site was anesthetized using 1% lidocaine with epinephrine.  Tissue site was obtained using a dermblade and hemostasis was achieved using drysol.      Immediately after the biopsy, the sites were treated using IL5FU as described below:    The base was injected with 5-Fluorouracil x 0.2 ml. Area was cleaned, dressed and post-procedure wound care instructions were reviewed.     Lot #: [id number]  Exp: 03/2022    Treatment Area (# of treatments by date) Tumor Size (mm)   (1) left shin KA 10      Eczematous dermatitis with prurigo nodularis  - discussed etiology and management strategies  - triamcinolone ointment is helpful but not controlling symptoms and new prurigo nodules from appearing  - discussed option of dupilumab as this may help her underlying eczema and possible traumatic etiology of KAs   - start dupilumab 600 mg subcutaneous injection once, and then 300 mg subcutaneous injection every 2 weeks   - sent to [facility]    Benign Lesions/ Findings:   Angioma(s)  Lentigo/Lentigines  Nevus/Nevi-Benign Appearing  Seborrheic Keratosis(es) - no irritation noted  - Reassurance provided regarding the benign appearance of lesions noted on exam today; no treatment is indicated in the absence of symptoms/changes.  - Reinforced importance of photoprotective strategies including liberal and frequent sunscreen use of a broad-spectrum SPF 30 or greater, use of protective clothing, and sun avoidance for prevention of cutaneous malignancy and photoaging.  Counseled patient on the importance of regular self-skin monitoring as well as routine clinical skin examinations as scheduled.     Personal history of non-melanoma skin cancer   - No evidence of recurrence at this time.  - Discussed maintaining vigilance and counseled on sun protection as above.    The patient was advised to call for an appointment should any new, changing, or symptomatic lesions develop.     RTC: Return in about 4 weeks (around 09/20/2021) for KAs lower legs. or sooner as needed   _________________________________________________________________      Chief Complaint     Chief Complaint   Patient presents with   ??? Skin Check     fbse       HPI     Lindsay Webster is a 60 y.o. female who presents as a returning patient (last seen 07/19/2021) to Dermatology for a lesion of concern. Spot on the left shin that is new since last visit, no prior treatment. It is itchy. Not bleeding or painful.    Itchy overall and gets raised scaly rash sometimes. Changed all skin products to non-fragrance. Using triamcinolone 0.1% ointment BID as needed.    The patient denies any other new or changing lesions or areas of concern.     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     ROS: Other than symptoms mentioned in the HPI, no fevers, chills, or other skin complaints    Physical Examination     GENERAL: Well-appearing female in no acute distress, resting comfortably.  NEURO: Alert and oriented, answers questions appropriately  PSYCH: Normal mood and affect  SKIN (Full Skin Exam): Examination of the face, eyelids, lips, nose, ears, neck, chest, abdomen, back, arms, legs, hands, feet, palms, soles, nails was performed  - Angioma(s): Scattered red vascular papule(s) on the trunk  - Lentigo/lentigines: Scattered pigmented macules that are tan to brown in color and are somewhat non-uniform in shape and concentrated in the sun-exposed areas of the face, trunk and upper extremities  - Nevus/nevi: Scattered well-demarcated, regular, pigmented macule(s) and/or papule(s) on the trunk, upper extremities and lower extremities  - Seborrheic Keratosis(es): Stuck-on appearing keratotic papule(s) on the trunk, upper extremities and lower extremities, none irritated with redness, crusting, edema, and/or partial avulsion  - Surgical Site(s): Well healed scars without nodularity, re-pigmentation, scaling, erythema, or regrowth  - Lesion A: 1 cm scaly domed papule on left shin  - scaly eczematous patches and plaques on back and extremities  - left lateral shin with scaly eczematous papules    All areas not commented on are within normal limits or unremarkable          (Approved Template 02/24/2020)

## 2021-08-24 DIAGNOSIS — L281 Prurigo nodularis: Principal | ICD-10-CM

## 2021-08-24 DIAGNOSIS — L309 Dermatitis, unspecified: Principal | ICD-10-CM

## 2021-08-25 NOTE — Unmapped (Signed)
[facility] Specialty Medication Onboarding    Specialty Medication: Dupixent pen 300mg /107ml (loading and maintenance)  Prior Authorization: Approved   Financial Assistance: No - copay  <$25  Final Copay/Day Supply: $10.35/ 14ds loading      $10.35 / 28ds maintenance    Insurance Restrictions: None     Notes to Pharmacist:     The triage team has completed the benefits investigation and has determined that the patient is able to fill this medication at [facility]. Please contact the patient to complete the onboarding or follow up with the prescribing physician as needed.

## 2021-08-25 NOTE — Unmapped (Signed)
Patient notified of biopsy results by mychart. Treated with intralesional 5-FU at time of visit.      Dx: Lindsay Webster, left shin     Tracking: N   Tx: s/p intralesional 5-FU 08/23/2021

## 2021-08-26 NOTE — Unmapped (Signed)
Addended by: Inis Sizer A on: 08/25/2021 06:04 PM     Modules accepted: Level of Service

## 2021-08-26 NOTE — Unmapped (Signed)
[facility]   Patient Onboarding/Medication Counseling    Lindsay Webster is a 61 y.o. female with eczema who I am counseling today on initiation of therapy.  I am speaking to the patient.    Was a Nurse, learning disability used for this call? No    Verified patient's date of birth / HIPAA.    Specialty medication(s) to be sent: Inflammatory Disorders: Dupixent      Non-specialty medications/supplies to be sent: Sharps container      Medications not needed at this time: N/A         Dupixent (dupilumab)    Medication & Administration     Dosage: Atopic dermatitis: Inject 600mg  under the skin as a loading dose followed by 300mg  every 14 days thereafter    Administration:     Dupixent Pen  1. Gather all supplies needed for injection on a clean, flat working surface: medication syringe removed from packaging, alcohol swab, sharps container, etc.  2. Look at the medication label - look for correct medication, correct dose, and check the expiration date  3. Look at the medication - the liquid in the pen should appear clear and colorless to pale yellow  4. Lay the pen on a flat surface and allow it to warm up to room temperature for at least 45 minutes  5. Select injection site - you can use the front of your thigh or your belly (but not the area 2 inches around your belly button); if someone else is giving you the injection you can also use your upper arm in the skin covering your triceps muscle  6. Prepare injection site - wash your hands and clean the skin at the injection site with an alcohol swab and let it air dry, do not touch the injection site again before the injection  7. Hold the middle of the body of the pen and gently pull the needle safety cap straight out. Be careful not to bend the needle. Do not remove until immediately prior to injection  8. Press the pen down onto the injection site at a 90 degree angle.   9. You will hear a click as the injection starts, and then a second click when the injection is ALMOST done. Keep holding the pen against the skin for 5 more seconds after the second click.   10. Check that the pen is empty by looking in the viewing window - the yellow indicator bar should be stopped, and should fill the window.   11. Remove the pen from the skin by lifting straight up.   12. Dispose of the used pen immediately in your sharps disposal container  13. If you see any blood at the injection site, press a cotton ball or gauze on the site and maintain pressure until the bleeding stops, do not rub the injection site    Adherence/Missed dose instructions:  If a dose is missed, administer within 7 days from the missed dose and then resume the original schedule. If the missed dose is not administered within 7 days, you can either wait until the next dose on the original schedule or take your dose now and resume every 14 days from the new injection date. Do not use 2 doses at the same time or extra doses.      Goals of Therapy     -Reduce symptoms of pruritus and dermatitis  -Prevent exacerbations  -Minimize therapeutic risks  -Avoidance of long-term systemic and topical glucocorticoid use  -Maintenance  of effective psychosocial functioning    Side Effects & Monitoring Parameters     ??? Injection site reaction (redness, irritation, inflammation localized to the site of administration)  ??? Signs of a common cold - minor sore throat, runny or stuffy nose, etc.  ??? Recurrence of cold sores (herpes simplex)      The following side effects should be reported to the provider:  ??? Signs of a hypersensitivity reaction - rash; hives; itching; red, swollen, blistered, or peeling skin; wheezing; tightness in the chest or throat; difficulty breathing, swallowing, or talking; swelling of the mouth, face, lips, tongue, or throat; etc.  ??? Eye pain or irritation or any visual disturbances  ??? Shortness of breath or worsening of breathing      Contraindications, Warnings, & Precautions     ??? Have your bloodwork checked as you have been told by your prescriber   ??? Birth control pills and other hormone-based birth control may not work as well to prevent pregnancy  ??? Talk with your doctor if you are pregnant, planning to become pregnant, or breastfeeding  ??? Discuss the possible need for holding your dose(s) of Dupixent?? when a planned procedure is scheduled with the prescriber as it may delay healing/recovery timeline       Drug/Food Interactions     ??? Medication list reviewed in Epic. The patient was instructed to inform the care team before taking any new medications or supplements. No drug interactions identified.   ??? Talk with you prescriber or pharmacist before receiving any live vaccinations while taking this medication and after you stop taking it    Storage, Handling Precautions, & Disposal     ??? Store this medication in the refrigerator.  Do not freeze  ??? If needed, you may store at room temperature for up to 14 days  ??? Store in original packaging, protected from light  ??? Do not shake  ??? Dispose of used syringes in a sharps disposal container            Current Medications (including OTC/herbals), Comorbidities and Allergies     Current Outpatient Medications   Medication Sig Dispense Refill   ??? albuterol HFA 90 mcg/actuation inhaler 1 puff as needed     ??? ammonium lactate (LAC-HYDRIN) 12 % lotion 1 APPLICATION TWICE A DAY EXTERNALLY 30 DAYS     ??? ascorbic acid, vitamin C, (VITAMIN C) 1000 MG tablet Take 1 tablet (1,000 mg total) by mouth.     ??? CALCIUM ACETATE ORAL Take by mouth.     ??? calcium citrate-vitamin D3 500 mg-12.5 mcg /5 gram Powd Take by mouth.     ??? calcium-vitamin D 500 mg(1,250mg ) -200 unit per tablet Take 2 tablets by mouth in the morning and 2 tablets in the evening. Take with meals.     ??? cholecalciferol, vitamin D3-25 mcg, 1,000 unit,, 25 mcg (1,000 unit) capsule Take 5 capsules (125 mcg total) by mouth daily.     ??? cyanocobalamin 1,000 mcg/mL injection Inject into the muscle every thirty (30) days.     ??? cyclobenzaprine (FLEXERIL) 10 MG tablet TAKE 1 TABLET THREE TIMES A DAY AS NEEDED  0   ??? DENTA 5000 PLUS 1.1 % Crea USE DAILY ON TOOTHBRUSH AFTER REGULAR BRUSHING & FLOSSING DON TEAT OR DRINK FOR 30 MINUTES AFTER USE     ??? divalproex (DEPAKOTE) 250 MG DR tablet TAKE 1 TABLET IN THE MORNING AND TAKE 2 TABLETS IN THE EVENING 90 tablet 6   ???  DULoxetine (CYMBALTA) 30 MG capsule Take 1 capsule (30 mg total) by mouth.     ??? DULoxetine (CYMBALTA) 60 MG capsule Take 1 capsule (60 mg total) by mouth.     ??? dupilumab 300 mg/2 mL PnIj Inject the contents of 1 pen (300 mg) under the skin every fourteen (14) days. 4 mL 11   ??? EPINEPHrine (EPIPEN) 0.3 mg/0.3 mL injection Inject 0.3 mL (0.3 mg total) into the muscle.     ??? famotidine (PEPCID) 40 MG tablet      ??? fluconazole (DIFLUCAN) 150 MG tablet TAKE 1 TABLET BY MOUTH AS NEEDED     ??? fluorometholone (FML) 0.1 % ophthalmic suspension INSTILL ONE DROP IN [facility] AS NEEDED AS DIRECTED     ??? gabapentin (NEURONTIN) 100 MG capsule TAKE ONE CAPSULE 3 TIMES A DAY  0   ??? galcanezumab-gnlm (EMGALITY PEN) 120 mg/mL injection INJECT 1 ML (120 MG TOTAL) INTO THE SKIN ONCE FOR 1 DOSE.     ??? GLUC SU/CHONDRO SU A/VIT C/MN (GLUCOSAMINE CHONDROITIN MAXSTR ORAL) Take 1,500 Int'l Units by mouth daily at 0600.     ??? glucosamine-chondroitin 500-400 mg tablet Take 1 tablet by mouth in the morning.     ??? Lactobacillus acidophilus Powd Take 1 capsule by mouth.     ??? Lactobacillus rhamnosus GG (CULTURELLE) 10 billion cell capsule Take 1 capsule by mouth daily.     ??? LORazepam (ATIVAN) 1 MG tablet Take 1 tablet (1 mg total) by mouth daily.  2   ??? MAGNESIUM ORAL Take 500 mg by mouth daily.      ??? meclizine (ANTIVERT) 25 mg tablet Take 1 tablet (25 mg total) by mouth.     ??? meloxicam (MOBIC) 15 MG tablet Take 1 tablet (15 mg total) by mouth daily.     ??? metoprolol tartrate (LOPRESSOR) 25 MG tablet TAKE 1 TABLET BY MOUTH TWICE A DAY     ??? mupirocin (BACTROBAN) 2 % ointment Apply topically Three (3) times a day. To affected area till healed 15 g 1   ??? MYRBETRIQ 50 mg Tb24 extended-release tablets Take 1 tablet (50 mg total) by mouth daily.  2   ??? naloxegoL (MOVANTIK) tablet      ??? omeprazole (PRILOSEC) 40 MG capsule TAKE 1 CAPSULE TWICE A DAY 30 MINUTES BEFORE A MEAL 60 capsule 2   ??? oxyCODONE (OXYCONTIN) 10 mg TR12 12 hour crush resistant tablet Take 1 tablet (10 mg total) by mouth.     ??? polyethylene glycol (COLYTE) 240-22.72-6.72 gram solution   0   ??? polyethylene glycol (MIRALAX) 17 gram packet Take 17 g by mouth daily as needed.     ??? promethazine (PHENERGAN) 25 MG tablet Take 1 tablet (25 mg total) by mouth every eight (8) hours as needed for nausea. 60 tablet 1   ??? spironolactone (ALDACTONE) 25 MG tablet TAKE 1/2 TAB (12.5 MG) ON TUES ,THURS AND SATURDAY     ??? triamcinolone (KENALOG) 0.1 % ointment Apply topically Two (2) times a day. To itchy spots as needed 80 g 2   ??? TRIAMCINOLONE ACETONIDE (NASACORT NASL) 1 application into each nostril two (2) times a day as needed.     ??? venlafaxine (EFFEXOR-XR) 150 MG 24 hr capsule Take 1 capsule (150 mg total) by mouth daily.  2   ??? vitamin E-268 mg, 400 UNIT, 268 mg (400 UNIT) capsule Take 1 capsule (268 mg total) by mouth.     ??? zinc sulfate (ZINCATE) 220  mg (50 mg elemental zinc) capsule Take 50 mg by mouth daily.       No current facility-administered medications for this visit.       Allergies   Allergen Reactions   ??? Bee Pollens Swelling   ??? Codeine Hives   ??? Penicillins Hives   ??? Sulfa (Sulfonamide Antibiotics) Anaphylaxis and Shortness Of Breath     Other reaction(s): Unknown   ??? Venom-Honey Bee Swelling     Respiratory   Other reaction(s): Unknown   ??? Zonisamide Anaphylaxis     Headache medication   ??? Carisoprodol      Other reaction(s): rash   ??? Penicillin      Other reaction(s): Unknown   ??? Hydrocodone Nausea And Vomiting       Patient Active Problem List   Diagnosis   ??? Arthritis   ??? Fibromyalgia   ??? Headache   ??? Pain in soft tissues of limb   ??? Migraine   ??? Myalgia and myositis   ??? Disturbance of skin sensation   ??? Sleeping difficulty   ??? Stroke (CMS-HCC)   ??? Falls frequently   ??? Transient alteration of awareness   ??? Complicated migraine   ??? Vision disturbance   ??? Lumbar spinal stenosis   ??? Lumbar facet arthropathy   ??? Lumbar radiculopathy   ??? Nephrolithiasis   ??? Ureteral calculus, right   ??? Abnormal urine findings   ??? Epigastric pain   ??? History of nonmelanoma skin cancer       Reviewed and up to date in Epic.    Appropriateness of Therapy     Acute infections noted within Epic:  No active infections  Patient reported infection: None    Is medication and dose appropriate based on diagnosis and infection status? Yes    Prescription has been clinically reviewed: Yes      Baseline Quality of Life Assessment      How many days over the past month did your atopic dermatitis  keep you from your normal activities? For example, brushing your teeth or getting up in the morning. 0    Financial Information     Medication Assistance provided: Prior Authorization    Anticipated copay of $10.35 reviewed with patient. Verified delivery address.    Delivery Information     Scheduled delivery date: 08/31/21    Expected start date: 08/31/21    Medication will be delivered via UPS to the prescription address in [facility].  This shipment will not require a signature.      Explained the services we provide at [facility] and that each month we would call to set up refills.  Stressed importance of returning phone calls so that we could ensure they receive their medications in time each month.  Informed patient that we should be setting up refills 7-10 days prior to when they will run out of medication.  A pharmacist will reach out to perform a clinical assessment periodically.  Informed patient that a welcome packet, containing information about our pharmacy and other support services, a Notice of Privacy Practices, and a drug information handout will be sent.      The patient or caregiver noted above participated in the development of this care plan and knows that they can request review of or adjustments to the care plan at any time.      Patient or caregiver verbalized understanding of the above information as well as how to contact the  pharmacy at [phone] option 4 with any questions/concerns.  The pharmacy is open Monday through Friday 8:30am-4:30pm.  A pharmacist is available 24/7 via pager to answer any clinical questions they may have.    Patient Specific Needs     - Does the patient have any physical, cognitive, or cultural barriers? No    - Does the patient have adequate living arrangements? (i.e. the ability to store and take their medication appropriately) Yes    - Did you identify any home environmental safety or security hazards? No    - Patient prefers to have medications discussed with  Patient     - Is the patient or caregiver able to read and understand education materials at a high school level or above? Yes    - Patient's primary language is  English     - Is the patient high risk? No    SOCIAL DETERMINANTS OF HEALTH     At the [facility], we have learned that life circumstances - like trouble affording food, housing, utilities, or transportation can affect the health of many of our patients.   That is why we wanted to ask: are you currently experiencing any life circumstances that are negatively impacting your health and/or quality of life? No    Social Determinants of Health     Food Insecurity: Not on file   Tobacco Use: High Risk   ??? Smoking Tobacco Use: Every Day   ??? Smokeless Tobacco Use: Never   ??? Passive Exposure: Not on file   Transportation Needs: Not on file   Alcohol Use: Not on file   Housing/Utilities: Not on file   Substance Use: Not on file   Financial Resource Strain: Not on file   Physical Activity: Not on file   Health Literacy: Not on file   Stress: Not on file   Intimate Partner Violence: Not on file   Depression: Not on file Social Connections: Not on file       Would you be willing to receive help with any of the needs that you have identified today? Not applicable       Nonie Hoyer, PharmD  PGY1 Community-based Pharmacy Resident  [facility] - Specialty Pharmacy

## 2021-08-27 MED ORDER — EMPTY CONTAINER
0 refills | 0 days
Start: 2021-08-27 — End: ?

## 2021-08-30 MED FILL — EMPTY CONTAINER: 90 days supply | Qty: 1 | Fill #0

## 2021-08-30 NOTE — Unmapped (Signed)
Patient LVM on nurse line stating she has a yeast infection in her mouth and throat since starting the new medication from last appointment. She request to speak with someone about treatment for it and if this is caused by the new medication.  Please advise.  Thanks,  Vonzell Schlatter

## 2021-09-06 NOTE — Unmapped (Signed)
Per [employer], patient wants to hold off on more Dupixent for now. Will reach 4/10 visit notes and then reach back out to patient.    Mallory Schaad A. Katrinka Blazing, PharmD, BCPS - Pharmacist   [facility]
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
## 2021-09-20 ENCOUNTER — Ambulatory Visit
Admit: 2021-09-20 | Discharge: 2021-09-21 | Payer: MEDICARE | Attending: Student in an Organized Health Care Education/Training Program | Primary: Student in an Organized Health Care Education/Training Program

## 2021-09-20 DIAGNOSIS — Z85828 Personal history of other malignant neoplasm of skin: Principal | ICD-10-CM

## 2021-09-20 DIAGNOSIS — D492 Neoplasm of unspecified behavior of bone, soft tissue, and skin: Principal | ICD-10-CM

## 2021-09-20 DIAGNOSIS — L309 Dermatitis, unspecified: Principal | ICD-10-CM

## 2021-09-20 NOTE — Unmapped (Signed)
Dermatology Note     Assessment and Plan:      (1) Neoplasm Unsp x 1 (right forearm):     Risks, benefits and adverse effects of biopsy were reviewed.  After obtaining verbal consent, the biopsy site was anesthetized using 1% lidocaine with epinephrine.  Tissue site was obtained using a dermblade and hemostasis was achieved using drysol.      Immediately after the biopsy, the sites were treated using curettage and IL5FU as described below:    Curettage was performed x 2 passes. The base was injected with 5-Fluorouracil x 0.2 ml. Area was cleaned, dressed and post-procedure wound care instructions were reviewed.     Lot #: [id number]  Exp: 03/2022    Treatment Area (# of treatments by date) Tumor Size (mm)   (1) right forearm keratoacanthoma 8          Eczematous dermatitis with prurigo nodularis  - discussed etiology and management strategies  - triamcinolone ointment is helpful but not controlling symptoms and new prurigo nodules from appearing  - started dupilumab in April, has completed loading dose, continue maintenance dose of 300 mg subcutaneous injection every 14 days    Personal history of non-melanoma skin cancer   - No evidence of recurrence at this time.  - Discussed maintaining vigilance and counseled on sun protection as above.    The patient was advised to call for an appointment should any new, changing, or symptomatic lesions develop.     RTC: Return in about 4 weeks (around 10/18/2021) for KAs. or sooner as needed   _________________________________________________________________      Chief Complaint     Chief Complaint   Patient presents with    Skin Check     Recheck of KA on lower legs.   Pt states 5 other spots have appeared on left leg, spot on right arm.         HPI     Lindsay Webster is a 61 y.o. female who presents as a returning patient (last seen 08/23/2021) to Dermatology for multiple lesions of concern. Spot on right forearm that has popped up since last visit. Not tender or bleeding, no prior treatment.    Started dupixent and took the loading doses on April 2.     The patient denies any other new or changing lesions or areas of concern.     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     ROS: Other than symptoms mentioned in the HPI, no fevers, chills, or other skin complaints    Physical Examination     GENERAL: Well-appearing female in no acute distress, resting comfortably.  NEURO: Alert and oriented, answers questions appropriately  PSYCH: Normal mood and affect  SKIN (Focal Skin Exam): Per  patient request, examination of upper and lower extremities was performed  - Lesion A: 8 mm scaly erythematous papule on right forearm  - eczematous patches and plaques on upper and lower extremities  - ecchymoses on lower legs bilaterally          All areas not commented on are within normal limits or unremarkable      (Approved Template 02/24/2020)

## 2021-09-20 NOTE — Unmapped (Addendum)
You can try Amlactin or Cerave rough and bumpy for dead skin on legs.    Shave biopsy   A shave biopsy involves numbing a small area of your skin and then obtaining a sample to help [place] with proper diagnosis or skin condition. Biopsy results typically return in 7 to 14 days.    To care for the area: Leave the bandage in place until the morning after your procedure is performed. On a daily basis, carefully remove the bandage, then shower or wash as usual. Allow water to run over the site. Please do not scrub. Carefully dry the area, then apply ointment (some people develop an allergy to Neosporin, so we recommend Vaseline orAquaphor). Cover the site with a fresh bandage. Should any bleeding occur, apply firm pressure for 15 minutes. The treated site will heal best if  a scab never forms (the wound heals by new skin cells traveling from the outside toward the middle-their journey is easier if no scab stands in their way).    Long-term care: the site will be more sensitive than your surrounding skin. Keep it covered, and remember to apply sunscreen every day to all your exposed skin. A scar may remain which is lighter or pinker than your normal skin. Your body will continue to improve your scar for up to one year.    Infection following this procedure is rare. However, if you are worried about the appearance of your site, contact your doctor. Complete healing may take up to one month. We have a physician on call at all times. If you have any concerns about the site, please call our clinic at [phone]     [facility] releases most results to you as soon as they are available. Therefore, you may see some results before we do. Please give [place] 3 business days to review the tests and contact you by phone or through MyChart. If you are concerned that some results may be upsetting or confusing, you may wish to wait until we contact you before looking at the report in MyChart.   If you have an urgent question, you can send [place] a message or call our clinic. Otherwise, we prefer that you wait 3 business days for [place] to contact you.    Dermatology Clinical Team

## 2021-09-22 NOTE — Unmapped (Signed)
Patient notified by mychart of biopsy results. Treated at time of visit with intralesional 5-FU.      Dx: Lindsay Webster, right forearm     Tracking: N   Tx: observation, s/p IL-5FU

## 2021-09-23 MED FILL — DUPIXENT 300 MG/2 ML SUBCUTANEOUS PEN INJECTOR: SUBCUTANEOUS | 28 days supply | Qty: 4 | Fill #0

## 2021-09-23 NOTE — Unmapped (Signed)
Lindsay Webster reports her first dose of Dupixent went well. She delayed starting due to a reaction to her 5-FU, but this has improved as well. She's currently using hydroxyzine to help with itching. She hasn't seen any improvements with her itching/prurigo nodules yet, but she knows it will take more time.     She has dry eyes at baseline and is using artificial tears to help. She's not noticed any changes with her eye irritation since starting Dupixent.      [facility] Clinical Assessment & Refill Coordination Note    Ailis Rigaud, DOB: [birth year]  Phone: [phone] (home)     All above HIPAA information was verified with patient.     Was a Nurse, learning disability used for this call? No    Specialty Medication(s):   Inflammatory Disorders: Dupixent     Current Outpatient Medications   Medication Sig Dispense Refill    albuterol HFA 90 mcg/actuation inhaler 1 puff as needed      ammonium lactate (LAC-HYDRIN) 12 % lotion 1 APPLICATION TWICE A DAY EXTERNALLY 30 DAYS      ascorbic acid, vitamin C, (VITAMIN C) 1000 MG tablet Take 1 tablet (1,000 mg total) by mouth.      CALCIUM ACETATE ORAL Take by mouth.      calcium citrate-vitamin D3 500 mg-12.5 mcg /5 gram Powd Take by mouth.      calcium-vitamin D 500 mg(1,250mg ) -200 unit per tablet Take 2 tablets by mouth in the morning and 2 tablets in the evening. Take with meals.      cholecalciferol, vitamin D3-25 mcg, 1,000 unit,, 25 mcg (1,000 unit) capsule Take 5 capsules (125 mcg total) by mouth daily.      cyanocobalamin 1,000 mcg/mL injection Inject into the muscle every thirty (30) days.      cyclobenzaprine (FLEXERIL) 10 MG tablet TAKE 1 TABLET THREE TIMES A DAY AS NEEDED  0    DENTA 5000 PLUS 1.1 % Crea USE DAILY ON TOOTHBRUSH AFTER REGULAR BRUSHING & FLOSSING DON TEAT OR DRINK FOR 30 MINUTES AFTER USE      divalproex (DEPAKOTE) 250 MG DR tablet TAKE 1 TABLET IN THE MORNING AND TAKE 2 TABLETS IN THE EVENING 90 tablet 6    DULoxetine (CYMBALTA) 30 MG capsule Take 1 capsule (30 mg total) by mouth.      DULoxetine (CYMBALTA) 60 MG capsule Take 1 capsule (60 mg total) by mouth.      dupilumab 300 mg/2 mL PnIj Inject the contents of 1 pen (300 mg) under the skin every fourteen (14) days. 4 mL 11    empty container (SHARPS-A-GATOR DISPOSAL SYSTEM) Misc Use as directed 1 each 0    EPINEPHrine (EPIPEN) 0.3 mg/0.3 mL injection Inject 0.3 mL (0.3 mg total) into the muscle.      famotidine (PEPCID) 40 MG tablet       fluconazole (DIFLUCAN) 150 MG tablet TAKE 1 TABLET BY MOUTH AS NEEDED      fluorometholone (FML) 0.1 % ophthalmic suspension INSTILL ONE DROP IN EACH EYE AS NEEDED AS DIRECTED      gabapentin (NEURONTIN) 100 MG capsule TAKE ONE CAPSULE 3 TIMES A DAY  0    galcanezumab-gnlm (EMGALITY PEN) 120 mg/mL injection INJECT 1 ML (120 MG TOTAL) INTO THE SKIN ONCE FOR 1 DOSE.      GLUC SU/CHONDRO SU A/VIT C/MN (GLUCOSAMINE CHONDROITIN MAXSTR ORAL) Take 1,500 Int'l Units by mouth daily at 0600.      glucosamine-chondroitin 500-400 mg tablet  Take 1 tablet by mouth in the morning.      Lactobacillus acidophilus Powd Take 1 capsule by mouth.      Lactobacillus rhamnosus GG (CULTURELLE) 10 billion cell capsule Take 1 capsule by mouth daily.      LORazepam (ATIVAN) 1 MG tablet Take 1 tablet (1 mg total) by mouth daily.  2    MAGNESIUM ORAL Take 500 mg by mouth daily.       meclizine (ANTIVERT) 25 mg tablet Take 1 tablet (25 mg total) by mouth.      meloxicam (MOBIC) 15 MG tablet Take 1 tablet (15 mg total) by mouth daily.      metoprolol tartrate (LOPRESSOR) 25 MG tablet TAKE 1 TABLET BY MOUTH TWICE A DAY      mupirocin (BACTROBAN) 2 % ointment Apply topically Three (3) times a day. To affected area till healed 15 g 1    MYRBETRIQ 50 mg Tb24 extended-release tablets Take 1 tablet (50 mg total) by mouth daily.  2    naloxegoL (MOVANTIK) tablet       omeprazole (PRILOSEC) 40 MG capsule TAKE 1 CAPSULE TWICE A DAY 30 MINUTES BEFORE A MEAL 60 capsule 2    oxyCODONE (OXYCONTIN) 10 mg TR12 12 hour crush resistant tablet Take 1 tablet (10 mg total) by mouth.      polyethylene glycol (COLYTE) 240-22.72-6.72 gram solution   0    polyethylene glycol (MIRALAX) 17 gram packet Take 17 g by mouth daily as needed.      promethazine (PHENERGAN) 25 MG tablet Take 1 tablet (25 mg total) by mouth every eight (8) hours as needed for nausea. 60 tablet 1    spironolactone (ALDACTONE) 25 MG tablet TAKE 1/2 TAB (12.5 MG) ON TUES ,THURS AND SATURDAY      triamcinolone (KENALOG) 0.1 % ointment Apply topically Two (2) times a day. To itchy spots as needed 80 g 2    TRIAMCINOLONE ACETONIDE (NASACORT NASL) 1 application into each nostril two (2) times a day as needed.      venlafaxine (EFFEXOR-XR) 150 MG 24 hr capsule Take 1 capsule (150 mg total) by mouth daily.  2    vitamin E-268 mg, 400 UNIT, 268 mg (400 UNIT) capsule Take 1 capsule (268 mg total) by mouth.      zinc sulfate (ZINCATE) 220 mg (50 mg elemental zinc) capsule Take 50 mg by mouth daily.       No current facility-administered medications for this visit.        Changes to medications: Tishina reports no changes at this time.    Allergies   Allergen Reactions    Bee Pollens Swelling    Codeine Hives    Penicillins Hives    Sulfa (Sulfonamide Antibiotics) Anaphylaxis and Shortness Of Breath     Other reaction(s): Unknown    Venom-Honey Bee Swelling     Respiratory   Other reaction(s): Unknown    Zonisamide Anaphylaxis     Headache medication    Carisoprodol      Other reaction(s): rash    Penicillin      Other reaction(s): Unknown    Hydrocodone Nausea And Vomiting       Changes to allergies: No    SPECIALTY MEDICATION ADHERENCE     Dupixent - 0 left  Medication Adherence    Patient reported X missed doses in the last month: 0  Specialty Medication: Dupixent          Specialty medication(s) dose(s) confirmed:  Regimen is correct and unchanged.     Are there any concerns with adherence? No    Adherence counseling provided? Not needed    CLINICAL MANAGEMENT AND INTERVENTION      Clinical Benefit Assessment:    Do you feel the medicine is effective or helping your condition? Yes    Clinical Benefit counseling provided? Not needed    Adverse Effects Assessment:    Are you experiencing any side effects? No    Are you experiencing difficulty administering your medicine? No    Quality of Life Assessment:    Quality of Life    Rheumatology  Oncology  Dermatology  Cystic Fibrosis          Have you discussed this with your provider? Not needed    Acute Infection Status:    Acute infections noted within Epic:  No active infections  Patient reported infection: None    Therapy Appropriateness:    Is therapy appropriate and patient progressing towards therapeutic goals? Yes, therapy is appropriate and should be continued    DISEASE/MEDICATION-SPECIFIC INFORMATION      For patients on injectable medications: Patient currently has 0 doses left.  Next injection is scheduled for Sunday, 4/16 .    PATIENT SPECIFIC NEEDS     Does the patient have any physical, cognitive, or cultural barriers? No    Is the patient high risk? No    Does the patient require a Care Management Plan? No     SHIPPING     Specialty Medication(s) to be Shipped:   Inflammatory Disorders: Dupixent    Other medication(s) to be shipped: No additional medications requested for fill at this time     Changes to insurance: No    Delivery Scheduled: Yes, Expected medication delivery date: Friday, 4/14.     Medication will be delivered via UPS to the confirmed prescription address in [facility].    The patient will receive a drug information handout for each medication shipped and additional FDA Medication Guides as required.  Verified that patient has previously received a Conservation officer, historic buildings and a Surveyor, mining.    The patient or caregiver noted above participated in the development of this care plan and knows that they can request review of or adjustments to the care plan at any time.      All of the patient's questions and concerns have been addressed.    Lanney Gins   [facility] Specialty Pharmacist

## 2021-09-28 NOTE — Unmapped (Signed)
Patient Lindsay Webster on the nurse line.  She stated that the she had a reaction to the chemo shot last month.  She treated by her PCP and prescribed diflucan.  She has c/o of a yeast infection/possible thrush and would like to know if our office can prescribe another course of diflucan to treat this, or if her PCP should continue to manage the side effects?

## 2021-09-29 MED ORDER — FLUCONAZOLE 150 MG TABLET
ORAL_TABLET | Freq: Once | ORAL | 1 refills | 4 days | Status: CP
Start: 2021-09-29 — End: 2021-09-29

## 2021-10-15 NOTE — Unmapped (Signed)
[facility] Refill Coordination Note    Specialty Medication(s) to be Shipped:   Inflammatory Disorders: Dupixent    Other medication(s) to be shipped: No additional medications requested for fill at this time     Lindsay Webster, DOB: 21-Aug-1960  Phone: [phone] (home)       All above HIPAA information was verified with patient.     Was a Nurse, learning disability used for this call? No    Completed refill call assessment today to schedule patient's medication shipment from the [facility] [phone]).  All relevant notes have been reviewed.     Specialty medication(s) and dose(s) confirmed: Regimen is correct and unchanged.   Changes to medications: Doaa reports no changes at this time.  Changes to insurance: No  New side effects reported not previously addressed with a pharmacist or physician: None reported  Questions for the pharmacist: No    Confirmed patient received a Conservation officer, historic buildings and a Surveyor, mining with first shipment. The patient will receive a drug information handout for each medication shipped and additional FDA Medication Guides as required.       DISEASE/MEDICATION-SPECIFIC INFORMATION        For patients on injectable medications: Patient currently has 0 doses left.  Next injection is scheduled for 10/24/2021.    SPECIALTY MEDICATION ADHERENCE     Medication Adherence    Patient reported X missed doses in the last month: 0  Specialty Medication: Dupixent  Patient is on additional specialty medications: No  Any gaps in refill history greater than 2 weeks in the last 3 months: no  Demonstrates understanding of importance of adherence: yes  Informant: patient  Reliability of informant: reliable  Confirmed plan for next specialty medication refill: delivery by pharmacy  Refills needed for supportive medications: not needed              Were doses missed due to medication being on hold? No        REFERRAL TO PHARMACIST     Referral to the pharmacist: Not needed      [facility]     Shipping address confirmed in Epic.     Delivery Scheduled: Yes, Expected medication delivery date: 10/20/2021.     Medication will be delivered via UPS to the prescription address in Epic [facility].    Delphina Schum D Maggie Dworkin   [facility] Specialty Technician

## 2021-10-19 MED FILL — DUPIXENT 300 MG/2 ML SUBCUTANEOUS PEN INJECTOR: SUBCUTANEOUS | 28 days supply | Qty: 4 | Fill #1

## 2021-11-01 ENCOUNTER — Ambulatory Visit
Admit: 2021-11-01 | Discharge: 2021-11-02 | Payer: MEDICARE | Attending: Student in an Organized Health Care Education/Training Program | Primary: Student in an Organized Health Care Education/Training Program

## 2021-11-01 DIAGNOSIS — L570 Actinic keratosis: Principal | ICD-10-CM

## 2021-11-01 DIAGNOSIS — Z85828 Personal history of other malignant neoplasm of skin: Principal | ICD-10-CM

## 2021-11-01 DIAGNOSIS — L858 Other specified epidermal thickening: Principal | ICD-10-CM

## 2021-11-01 DIAGNOSIS — L309 Dermatitis, unspecified: Principal | ICD-10-CM

## 2021-11-01 NOTE — Unmapped (Addendum)
Cryosurgery  Cryosurgery (“freezing”) uses liquid nitrogen to destroy certain types of skin lesions. Lowering the temperature of the lesion in a small area surrounding skin destroys the lesion. Immediately following cryosurgery, you will notice redness and swelling of the treatment area. Blistering or weeping may occur, lasting approximately one week which will then be followed by crusting. Most areas will heal completely in 10 to 14 days.    Wash the treated areas daily. Allow soap and water to run over the areas, but do not scrub. Should a scab or crust form, allow it to fall off on its own. Do not remove or pick at it. Application of an ointment  and a bandage may make you feel more comfortable, but it is not necessary. Some people develop an allergy to Neosporin, so we recommend that Vaseline or  Aquaphor be used.    The cryotherapy site will be more sensitive than your surrounding skin. Keep it covered, and remember to apply sunscreen every day to all your sun exposed skin. A scar may remain which is lighter or pinker than your normal skin. Your body will continue to improve your scar for up to one year; however a light-colored scar may remain.    Infection following cryotherapy is rare. However if you are worried about the appearance of the treated area, contact your doctor. We have a physician on call at all times. If you have any concerns about the site, please call our clinic at [phone]

## 2021-11-01 NOTE — Unmapped (Signed)
Dermatology Note     Assessment and Plan:      Keratoacanthoma, right forearm  - treated at last visit with biopsy and intralesional 5-FU, residual lesion present today and jointly elected to treat with repeat intralesional 5-FU today    Risks, benefits and adverse effects of procedure were reviewed:    Site treated with IL5FU as described below:    Numbed with lidocaine 2% with epinephrine 1:100,000. The base was injected with 5-Fluorouracil x 0.2 ml. Area was cleaned, dressed and post-procedure wound care instructions were reviewed.     Lot #: [id number]  Exp: 03/2022    Treatment Area (# of treatments by date) Tumor Size (mm)   right forearm (2) keratoacanthoma 8      Actinic Keratosis(es):   - Discussed premalignant nature of lesions and therefore recommended therapy.   - Patient agrees to cryotherapy. See procedure note for details.  Cryotherapy Procedure Note:   After R/B/A discussed (including scarring, pigment alteration, recurrence, or persistence of the lesion) and verbal consent was obtained, identified lesions were treated with liquid nitrogen x 1 ten second freeze-thaw cycle. The patient tolerated the procedure well and was instructed on post-procedure care.  Total AK's frozen: 4  Location and number: forehead    Eczematous dermatitis with prurigo nodularis  - discussed etiology and management strategies  - triamcinolone ointment is helpful but not controlling symptoms and new prurigo nodules from appearing  - continue dupilumab 300 mg subcutaneous injection every 14 days, started in April 2023.     Personal history of non-melanoma skin cancer   - No evidence of recurrence at this time.  - Discussed maintaining vigilance and counseled on sun protection as above.    The patient was advised to call for an appointment should any new, changing, or symptomatic lesions develop.     RTC: Return in about 8 weeks (around 12/27/2021). or sooner as needed _________________________________________________________________      Chief Complaint     Chief Complaint   Patient presents with    Skin Problem     5FU injections on right forearm        HPI     Lindsay Webster is a 61 y.o. female who presents as a returning patient (last seen 09/20/2021) to Dermatology for multiple lesions of concern. At last visit lesion on right forearm was biopsied and treated with intralesional 5-FU. Biopsy showed keratoacanthoma.    Today notes residual lesion on right forearm. Overall improved. Not painful or bleeding. Did well with injection at last visit.     Scaly spots on forehead present for a few months, not painful or bleeding. No prior treatment.     The patient denies any other new or changing lesions or areas of concern.     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     ROS: Other than symptoms mentioned in the HPI, no fevers, chills, or other skin complaints    Physical Examination     GENERAL: Well-appearing female in no acute distress, resting comfortably.  NEURO: Alert and oriented, answers questions appropriately  PSYCH: Normal mood and affect  SKIN (Focal Skin Exam): Per patient request, examination of face, upper and lower extremities was performed  - Actinic Keratosis(es): Scaly erythematous macule(s) on the forehead  - right arm with 8 mm keratotic papule   - scaly eczematous plaques and patches on dorsal upper and lower extremities    All areas not commented on are within normal limits or unremarkable      (Approved Template 02/24/2020)

## 2021-11-12 NOTE — Unmapped (Signed)
[facility] Pharmacy Refill Coordination Note    Specialty Medication(s) to be Shipped:   Inflammatory Disorders: Dupixent    Other medication(s) to be shipped: No additional medications requested for fill at this time     Lindsay Webster, DOB: November 18, 1960  Phone: [phone] (home)       All above HIPAA information was verified with patient.     Was a Nurse, learning disability used for this call? No    Completed refill call assessment today to schedule patient's medication shipment from the [facility] [phone]).  All relevant notes have been reviewed.     Specialty medication(s) and dose(s) confirmed: Regimen is correct and unchanged.   Changes to medications: Masyn reports no changes at this time.  Changes to insurance: No  New side effects reported not previously addressed with a pharmacist or physician: None reported  Questions for the pharmacist: No    Confirmed patient received a Conservation officer, historic buildings and a Surveyor, mining with first shipment. The patient will receive a drug information handout for each medication shipped and additional FDA Medication Guides as required.       DISEASE/MEDICATION-SPECIFIC INFORMATION        For patients on injectable medications: Patient currently has 0 doses left.  Next injection is scheduled for 11/21/2021.    SPECIALTY MEDICATION ADHERENCE     Medication Adherence    Patient reported X missed doses in the last month: 0  Specialty Medication: Dupixent  Patient is on additional specialty medications: No  Any gaps in refill history greater than 2 weeks in the last 3 months: no  Demonstrates understanding of importance of adherence: yes  Informant: patient  Reliability of informant: reliable  Confirmed plan for next specialty medication refill: delivery by pharmacy  Refills needed for supportive medications: not needed              Were doses missed due to medication being on hold? No        REFERRAL TO PHARMACIST     Referral to the pharmacist: Not needed      [facility]     Shipping address confirmed in Epic.     Delivery Scheduled: Yes, Expected medication delivery date: 11/16/2021.     Medication will be delivered via UPS to the prescription address in Epic WAM.    Nilah Belcourt D Chinenye Katzenberger   [facility] Specialty Technician

## 2021-11-15 MED FILL — DUPIXENT 300 MG/2 ML SUBCUTANEOUS PEN INJECTOR: SUBCUTANEOUS | 28 days supply | Qty: 4 | Fill #2

## 2021-11-22 DIAGNOSIS — L281 Prurigo nodularis: Principal | ICD-10-CM

## 2021-11-22 DIAGNOSIS — L309 Dermatitis, unspecified: Principal | ICD-10-CM

## 2021-12-08 DIAGNOSIS — L281 Prurigo nodularis: Principal | ICD-10-CM

## 2021-12-08 DIAGNOSIS — L309 Dermatitis, unspecified: Principal | ICD-10-CM

## 2021-12-08 NOTE — Unmapped (Signed)
[facility] Special[facility] Pharmacy Refill Coordination Note    Specialty Medication(s) to be Shipped:   Inflammatory Disorders: Dupixent    Other medication(s) to be shipped: No additional medications requested for fill at this time     Lindsay Webster, DOB: 1961-05-23  Phone: [phone] (home)       All above HIPAA information was verified with patient.     Was a Nurse, learning disability used for this call? No    Completed refill call assessment today to schedule patient's medication shipment from the [facility] [phone]).  All relevant notes have been reviewed.     Specialty medication(s) and dose(s) confirmed: Regimen is correct and unchanged.   Changes to medications: Neeya reports no changes at this time.  Changes to insurance: No  New side effects reported not previously addressed with a pharmacist or physician: None reported  Questions for the pharmacist: No    Confirmed patient received a Conservation officer, historic buildings and a Surveyor, mining with first shipment. The patient will receive a drug information handout for each medication shipped and additional FDA Medication Guides as required.       DISEASE/MEDICATION-SPECIFIC INFORMATION        For patients on injectable medications: Patient currently has 0 doses left.  Next injection is scheduled for 12/19/2021.    SPECIALTY MEDICATION ADHERENCE     Medication Adherence    Patient reported X missed doses in the last month: 0  Specialty Medication: Dupixent  Patient is on additional specialty medications: No  Any gaps in refill history greater than 2 weeks in the last 3 months: no  Demonstrates understanding of importance of adherence: yes  Informant: patient  Reliability of informant: reliable  Confirmed plan for next specialty medication refill: delivery by pharmacy  Refills needed for supportive medications: not needed              Were doses missed due to medication being on hold? No        REFERRAL TO PHARMACIST     Referral to the pharmacist: Not needed      [facility]     Shipping address confirmed in Epic.     Delivery Scheduled: Yes, Expected medication delivery date: 12/10/2021.     Medication will be delivered via UPS to the prescription address in Epic WAM.    Wilborn Membreno D Zain Lankford   [facility] Specialty Technician

## 2021-12-09 MED FILL — DUPIXENT 300 MG/2 ML SUBCUTANEOUS PEN INJECTOR: SUBCUTANEOUS | 28 days supply | Qty: 4 | Fill #3

## 2021-12-24 NOTE — Unmapped (Signed)
Dermatology Note     Assessment and Plan:      Eczematous dermatitis with prurigo nodularis  - Will increase topical steroid potency from triamcinolone to Lidex   - fluocinonide (LIDEX) 0.05 % ointment; Apply topically Two (2) times a day. To itchy, raised lesions on the legs until improved  - Continue dupilumab 300 mg subcutaneous injection every 14 days, started in April 2023.     Notalgia paresthetica  - Discussed etiology and management strategies including topical and oral medications (gabapentin). We also discussed obtaining imaging to evaluate for cervical spine disease, and patient will discuss this with her PCP at her upcoming appt.  - Currently on Cymbalta 90mg  daily.     Irritated/Inflamed Seborrheic Keratosis(es):   - Discussed benign nature of lesions, but due to symptoms, offered treatment with cryotherapy.  - Patient agrees to cryotherapy. See procedure note for details.  Cryotherapy Procedure Note:   After R/B/A discussed (including scarring, pigment alteration, recurrence, or persistence of the lesion) and verbal consent was obtained, identified lesions were treated with liquid nitrogen x 1-2 ten second freeze-thaw cycle. The patient tolerated the procedure well and was instructed on post-procedure care.  Total ISK(s) frozen: 1  Location and number: left lateral breast x1    Personal history of non-melanoma skin cancer (including eruptive KAs)  - No evidence of recurrence at this time.  - Discussed maintaining vigilance and counseled on sun protection as above.    Benign Lesions/ Findings:   Nevus/Nevi-Benign Appearing  Seborrheic Keratosis(es) - no irritation noted  - Reassurance provided regarding the benign appearance of lesions noted on exam today; no treatment is indicated in the absence of symptoms/changes.  - Reinforced importance of photoprotective strategies including liberal and frequent sunscreen use of a broad-spectrum SPF 30 or greater, use of protective clothing, and sun avoidance for prevention of cutaneous malignancy and photoaging.  Counseled patient on the importance of regular self-skin monitoring as well as routine clinical skin examinations as scheduled.     The patient was advised to call for an appointment should any new, changing ,or symptomatic lesions or a flare develop.     RTC: follow up in 4-6 weeks or sooner as needed   _____________________________________________________________________________________    Referring Provider: Parks Ranger, MD     Chief Complaint     Chief Complaint   Patient presents with   ??? Follow-up     LESIONS REMOVED-HEALING FINE       HPI     Lindsay Webster is a pleasant 61 y.o. female, who presents as a returning patient (last seen 11/01/2021) to Dermatology for a full body skin exam for evaluation of lesion (s).      At last visit, residual KA on right forearm was retreated with IL5FU. She also was continued on dupixent 300mg  every 14 days for eczematous dermatitis and prurigo nodularis.    Today patient reports the following concerns:  - Continues to experiencing extreme itching on the back which has been long-standing and constantly scratches. She takes hydroxyzine which helps with sleep and is also on Cymbalta 90mg  daily.  - She recently developed an itchy lesion at her biopsy scar on the left breast; states that bx was done in her teens for an irritated mole that was reportedly benign.   - Wound on her right leg from a dog scratch 1 week ago and has been using vaseline and gauze to the area.  - Itchy lesions on her lower extremities. She has been  applying mupirocin and triamcinolone and is still on Dupixent.    The patient denies any other new/changing lesions or other issues of concern.     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    Physical Examination     General: Well-appearing female, in no acute distress, resting comfortably  Neuro: Alert and oriented, answers questions appropriately  Full Skin Exam: Examination of the scalp, face, eyelids, lips, nose, ears, neck, chest, abdomen, back, arms, legs, hands, feet, palms, soles, nails, buttocks was performed.  Skin examination was notable for the following:  Nevi: Scattered well-demarcated, regular, pigmented macules and papules on the trunk and extremities > rest  Seborrheic Keratoses: Multiple stuck-on appearing  keratotic papules on the trunk and extremities. one irritated w redness, crusting, edema, and /or partial avulsion   Surgical Site: Well healed scar on the right forearm, left shin, right shin, and right hand without nodularity, re-pigmentation,  scaling, erythema, or regrowth  Keratotic papules scattered over the lower legs    All areas not commented on are within normal limits or unremarkable.    Scribe's Attestation: Geroge Baseman. Glorianne Proctor, MD, PhD obtained and performed the history, physical exam and medical decision making elements that were entered into the chart.  Signed by Minette Brine, Scribe, on December 27, 2021 at 11:00 AM.    ----------------------------------------------------------------------------------------------------------------------  January 02, 2022 8:20 AM. Documentation assistance provided by the Scribe. I was present during the time the encounter was recorded. The information recorded by the Scribe was done at my direction and has been reviewed and validated by me.  ----------------------------------------------------------------------------------------------------------------------

## 2021-12-27 ENCOUNTER — Ambulatory Visit: Admit: 2021-12-27 | Discharge: 2021-12-28 | Payer: MEDICARE

## 2021-12-27 DIAGNOSIS — Z85828 Personal history of other malignant neoplasm of skin: Principal | ICD-10-CM

## 2021-12-27 DIAGNOSIS — L281 Prurigo nodularis: Principal | ICD-10-CM

## 2021-12-27 MED ORDER — FLUOCINONIDE 0.05 % TOPICAL OINTMENT
Freq: Two times a day (BID) | TOPICAL | 2 refills | 0 days | Status: CP
Start: 2021-12-27 — End: 2022-12-27

## 2021-12-27 NOTE — Unmapped (Signed)
[facility] Special[facility] Pharmacy Refill Coordination Note    Specialty Medication(s) to be Shipped:   Inflammatory Disorders: Dupixent    Other medication(s) to be shipped: No additional medications requested for fill at this time     Lindsay Webster, DOB: May 31, 1961  Phone: [phone] (home)       All above HIPAA information was verified with patient.     Was a Nurse, learning disability used for this call? No    Completed refill call assessment today to schedule patient's medication shipment from the [facility] [phone]).  All relevant notes have been reviewed.     Specialty medication(s) and dose(s) confirmed: Regimen is correct and unchanged.   Changes to medications: Lindsay Webster reports no changes at this time.  Changes to insurance: No  New side effects reported not previously addressed with a pharmacist or physician: None reported  Questions for the pharmacist: No    Confirmed patient received a Conservation officer, historic buildings and a Surveyor, mining with first shipment. The patient will receive a drug information handout for each medication shipped and additional FDA Medication Guides as required.       DISEASE/MEDICATION-SPECIFIC INFORMATION        For patients on injectable medications: Patient currently has 0 doses left.  Next injection is scheduled for 01/02/22.    SPECIALTY MEDICATION ADHERENCE     Medication Adherence    Patient reported X missed doses in the last month: 0  Specialty Medication: DUPIXENT PEN 300 mg/2 mL Pnij (dupilumab)  Patient is on additional specialty medications: No              Were doses missed due to medication being on hold? No        REFERRAL TO PHARMACIST     Referral to the pharmacist: Not needed      [facility]     Shipping address confirmed in Epic.     Delivery Scheduled: Yes, Expected medication delivery date: 12/30/21.     Medication will be delivered via UPS to the prescription address in Epic WAM.    Lindsay Webster   [facility] Specialty Technician

## 2021-12-27 NOTE — Unmapped (Addendum)
Your itching on the back is known as notalgia paresthetica, which is nerve related itch. Since it is not getting better, talk to your PCP about considering imaging of the cervical spine.    Notalgia paresthetica -- Like brachioradial pruritus, notalgia paresthetica is characterized by localized pruritus and appears to be of neurologic origin. The current view on etiology is that the itch is due to nerve entrapment of the posterior rami of spinal nerves that arise from T2 to T6.  The symptoms are unilateral and involve skin medial to the scapular border on the mid or upper back. Cutaneous changes secondary to scratching or rubbing the skin (such as hyperpigmentation) are the only visible findings.    Cryosurgery  Cryosurgery (???freezing???) uses liquid nitrogen to destroy certain types of skin lesions. Lowering the temperature of the lesion in a small area surrounding skin destroys the lesion. Immediately following cryosurgery, you will notice redness and swelling of the treatment area. Blistering or weeping may occur, lasting approximately one week which will then be followed by crusting. Most areas will heal completely in 10 to 14 days.    Wash the treated areas daily. Allow soap and water to run over the areas, but do not scrub. Should a scab or crust form, allow it to fall off on its own. Do not remove or pick at it. Application of an ointment  and a bandage may make you feel more comfortable, but it is not necessary. Some people develop an allergy to Neosporin, so we recommend that Vaseline or  Aquaphor be used.    The cryotherapy site will be more sensitive than your surrounding skin. Keep it covered, and remember to apply sunscreen every day to all your sun exposed skin. A scar may remain which is lighter or pinker than your normal skin. Your body will continue to improve your scar for up to one year; however a light-colored scar may remain.    Infection following cryotherapy is rare. However if you are worried about the appearance of the treated area, contact your doctor. We have a physician on call at all times. If you have any concerns about the site, please call our clinic at [phone]

## 2021-12-29 MED FILL — DUPIXENT 300 MG/2 ML SUBCUTANEOUS PEN INJECTOR: SUBCUTANEOUS | 28 days supply | Qty: 4 | Fill #4

## 2022-01-19 NOTE — Unmapped (Signed)
[facility] Pharm[facility] Refill Coordination Note    Specialty Medication(s) to be Shipped:   Inflammatory Disorders: Dupixent    Other medication(s) to be shipped: No additional medications requested for fill at this time     Lindsay Webster, DOB: 1960-07-10  Phone: [phone] (home)       All above HIPAA information was verified with patient.     Was a Nurse, learning disability used for this call? No    Completed refill call assessment today to schedule patient's medication shipment from the [facility] [phone]).  All relevant notes have been reviewed.     Specialty medication(s) and dose(s) confirmed: Regimen is correct and unchanged.   Changes to medications: Lindsay Webster reports no changes at this time.  Changes to insurance: No  New side effects reported not previously addressed with a pharmacist or physician: None reported  Questions for the pharmacist: No    Confirmed patient received a Conservation officer, historic buildings and a Surveyor, mining with first shipment. The patient will receive a drug information handout for each medication shipped and additional FDA Medication Guides as required.       DISEASE/MEDICATION-SPECIFIC INFORMATION        For patients on injectable medications: Patient currently has 0 doses left.  Next injection is scheduled for 01/30/2022.    SPECIALTY MEDICATION ADHERENCE     Medication Adherence    Patient reported X missed doses in the last month: 0  Specialty Medication: Dupixent  Patient is on additional specialty medications: No  Any gaps in refill history greater than 2 weeks in the last 3 months: no  Demonstrates understanding of importance of adherence: yes  Informant: patient  Reliability of informant: reliable              Confirmed plan for next specialty medication refill: delivery by pharmacy  Refills needed for supportive medications: not needed              Were doses missed due to medication being on hold? No     DUPIXENT PEN 300 mg/2 mL : 0 on hand        REFERRAL TO PHARMACIST     Referral to the pharmacist: Not needed      [facility]     Shipping address confirmed in Epic.     Delivery Scheduled: Yes, Expected medication delivery date: 01/25/2022.     Medication will be delivered via UPS to the prescription address in Epic WAM.    Lindsay Webster   [facility] Specialty Technician

## 2022-01-24 MED FILL — DUPIXENT 300 MG/2 ML SUBCUTANEOUS PEN INJECTOR: SUBCUTANEOUS | 28 days supply | Qty: 4 | Fill #5

## 2022-01-31 ENCOUNTER — Ambulatory Visit
Admit: 2022-01-31 | Discharge: 2022-02-01 | Payer: MEDICARE | Attending: Student in an Organized Health Care Education/Training Program | Primary: Student in an Organized Health Care Education/Training Program

## 2022-01-31 DIAGNOSIS — L281 Prurigo nodularis: Principal | ICD-10-CM

## 2022-01-31 DIAGNOSIS — L814 Other melanin hyperpigmentation: Principal | ICD-10-CM

## 2022-01-31 DIAGNOSIS — L910 Hypertrophic scar: Principal | ICD-10-CM

## 2022-01-31 DIAGNOSIS — D229 Melanocytic nevi, unspecified: Principal | ICD-10-CM

## 2022-01-31 DIAGNOSIS — L821 Other seborrheic keratosis: Principal | ICD-10-CM

## 2022-01-31 DIAGNOSIS — L570 Actinic keratosis: Principal | ICD-10-CM

## 2022-01-31 DIAGNOSIS — Z85828 Personal history of other malignant neoplasm of skin: Principal | ICD-10-CM

## 2022-01-31 MED ORDER — MUPIROCIN 2 % TOPICAL OINTMENT
Freq: Three times a day (TID) | TOPICAL | 3 refills | 0 days | Status: CP
Start: 2022-01-31 — End: 2022-02-07

## 2022-01-31 MED ADMIN — triamcinolone acetonide (KENALOG) injection 10 mg: 10 mg | INTRA_ARTICULAR | @ 15:00:00 | Stop: 2022-01-31

## 2022-01-31 NOTE — Unmapped (Signed)
It was nice to see you today! Your resident physician was Dr. Abie Killian    If any of your medications are too expensive, look for a coupon at GoodRx.com or reference the application on a smartphone  - Enter the medication name, size, and your zip code to find coupons for local pharmacies.   - Print a coupon and bring it to the pharmacy, or pull up the coupon on a smartphone.  - You can also call pharmacies to ask about the cost of your medication before you pick it up.  If you still cannot afford your medication, please let us know.     Please call our clinic at [phone] with any concerns or to schedule a follow up appointment. We look forward to seeing you again!    [facility] releases most results to you as soon as they are available. Therefore, you may see some results before we do. Please give us 2 business days to review the tests and contact you by phone or through MyChart. If you are concerned that some results may be upsetting or confusing, you may wish to wait until we contact you before looking at the report in MyChart. If you have an urgent question, you can send us a message or call our clinic. Otherwise, we prefer that you wait 2 business days for us to contact you.

## 2022-01-31 NOTE — Unmapped (Addendum)
Dermatology Note     Assessment and Plan:      Benign Lesions/ Findings:   Actinic Elastosis  Nevus/Nevi-Benign Appearing  Seborrheic Keratosis(es) - no irritation noted  - Reassurance provided regarding the benign appearance of lesions noted on exam today; no treatment is indicated in the absence of symptoms/changes.  - Reinforced importance of photoprotective strategies including liberal and frequent sunscreen use of a broad-spectrum SPF 30 or greater, use of protective clothing, and sun avoidance for prevention of cutaneous malignancy and photoaging.  Counseled patient on the importance of regular self-skin monitoring as well as routine clinical skin examinations as scheduled.     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 Discussed lesions today more consistent with scars and not new skin cancers   - Discussed maintaining vigilance and counseled on sun protection as above.  - Two hypertrophic scars of RLE injected with intra-lesional kenalog     Intralesional Kenalog Procedure Note: After the patient was informed of risks (including atrophy and dyspigmentation), benefits and side effects of intralesional steroid injection, the patient elected to undergo injection and verbal consent was obtained. Skin was cleaned with alcohol and injected intralesionally into the sites (below). The patient tolerated the procedure well without complications and was instructed on post-procedure care.  Location(s): RLE   Number of sites treated: 2  Kenalog (triamcinolone) Concentration: 10 mg/ml   Volume: 0.2 ml total    Actinic Keratosis(es):   - Discussed premalignant nature of lesions and therefore recommended therapy.   - Patient agrees to cryotherapy. See procedure note for details.  Cryotherapy Procedure Note:   After R/B/A discussed (including scarring, pigment alteration, recurrence, or persistence of the lesion) and verbal consent was obtained, identified lesions were treated with liquid nitrogen x 1 ten second freeze-thaw cycle. The patient tolerated the procedure well and was instructed on post-procedure care.  Total AK's frozen:4  Location and number: Chest, LUE, face     Eczematous dermatitis with prurigo nodularis, excoriated lesions   - Continue fluocinonide (LIDEX) 0.05 % ointment; Apply topically Two (2) times a day. To itchy, raised lesions on the legs until improved  - Continue dupilumab 300 mg subcutaneous injection every 14 days, started in April 2023.   - Start mupirocin ointment twice daily to excoriated lesions on face     The patient was advised to call for an appointment should any new, changing, or symptomatic lesions develop.     RTC: 6-8 weeks   _________________________________________________________________      Chief Complaint     Chief Complaint   Patient presents with   ??? Eczema       HPI     Lindsay Webster is a 61 y.o. female who presents as a returning patient (last seen 12/27/2021) to Dermatology for follow up of below.      1.) Follow up eczematous dermatitis, prurigo nodularis. At last visit, increased topical steroid to fluocinonide 0.05%. Also continued on dupixent 300 mg q2 weeks. 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 Previously treated with IL 5 FU. She believes she has had improvement in lesions while on dupixent. Endorsing several lesions of concern on upper, lower extremities and face     She also reports that following her last visit (where we diagnosed notalgia paraesthetica) she saw  her PCP who ordered spinal imaging which has show severe DDD and spurs.  Planning to see neurosurgeon in [place].    The patient denies any other new or changing lesions or areas of concern.     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     ROS: Other than symptoms mentioned in the HPI, no fevers, chills, or other skin complaints    Physical Examination     GENERAL: Well-appearing female in no acute distress, resting comfortably.  NEURO: Alert and oriented, answers questions appropriately  SKIN (Full Skin Exam): Examination of the face, eyelids, lips, nose, ears, neck, chest, abdomen, back, arms, legs, hands, feet, palms, soles, nails was performed  - Actinic Elastosis: mild chronic sun damage: dyspigmentation, telangiectasia, and wrinkling  - Actinic Keratosis(es): Scaly erythematous macule(s) on the upper extremities  - Nevus/nevi: Scattered well-demarcated, regular, pigmented macule(s) and/or papule(s) on the scattered diffusely  - Seborrheic Keratosis(es): Stuck-on appearing keratotic papule(s) on the scattered diffusely, none irritated with redness, crusting, edema, and/or partial avulsion  - Firm pink papules on lower extremities as site of prior keratoacanthomas  - Scattered eroded pink papules noted diffusely     (Approved Template 02/24/2020)

## 2022-02-18 NOTE — Unmapped (Signed)
[facility] Refill Coordination Note    Specialty Medication(s) to be Shipped:   Inflammatory Disorders: Dupixent    Other medication(s) to be shipped: No additional medications requested for fill at this time     Lindsay Webster, DOB: 1961/05/29  Phone: [phone] (home)       All above HIPAA information was verified with patient.     Was a Nurse, learning disability used for this call? No    Completed refill call assessment today to schedule patient's medication shipment from the [facility] [phone]).  All relevant notes have been reviewed.     Specialty medication(s) and dose(s) confirmed: Regimen is correct and unchanged.   Changes to medications: Lindsay Webster reports no changes at this time.  Changes to insurance: No  New side effects reported not previously addressed with a pharmacist or physician: None reported  Questions for the pharmacist: No    Confirmed patient received a Conservation officer, historic buildings and a Surveyor, mining with first shipment. The patient will receive a drug information handout for each medication shipped and additional FDA Medication Guides as required.       DISEASE/MEDICATION-SPECIFIC INFORMATION        For patients on injectable medications: Patient currently has 0 doses left.  Next injection is scheduled for 02/27/2022.    SPECIALTY MEDICATION ADHERENCE     Medication Adherence    Patient reported X missed doses in the last month: 0  Specialty Medication: Dupixent  Patient is on additional specialty medications: No  Any gaps in refill history greater than 2 weeks in the last 3 months: no  Demonstrates understanding of importance of adherence: yes  Informant: patient  Reliability of informant: reliable              Confirmed plan for next specialty medication refill: delivery by pharmacy  Refills needed for supportive medications: not needed              Were doses missed due to medication being on hold? No     DUPIXENT PEN 300 mg/2 mL : 0 on hand        REFERRAL TO PHARMACIST     Referral to the pharmacist: Not needed      [facility]     Shipping address confirmed in Epic.     Delivery Scheduled: Yes, Expected medication delivery date: 02/23/2022.     Medication will be delivered via UPS to the prescription address in Epic WAM.    Lindsay Webster   [facility] Specialty Technician

## 2022-02-22 MED FILL — DUPIXENT 300 MG/2 ML SUBCUTANEOUS PEN INJECTOR: SUBCUTANEOUS | 28 days supply | Qty: 4 | Fill #6

## 2022-03-17 NOTE — Unmapped (Signed)
[facility] Refill Coordination Note    Specialty Medication(s) to be Shipped:   Inflammatory Disorders: Dupixent    Other medication(s) to be shipped: No additional medications requested for fill at this time     Lindsay Webster, DOB: 19-Apr-1961  Phone: [phone] (home)       All above HIPAA information was verified with patient.     Was a Nurse, learning disability used for this call? No    Completed refill call assessment today to schedule patient's medication shipment from the [facility] [phone]).  All relevant notes have been reviewed.     Specialty medication(s) and dose(s) confirmed: Regimen is correct and unchanged.   Changes to medications: Toria reports no changes at this time.  Changes to insurance: No  New side effects reported not previously addressed with a pharmacist or physician: None reported  Questions for the pharmacist: No    Confirmed patient received a Conservation officer, historic buildings and a Surveyor, mining with first shipment. The patient will receive a drug information handout for each medication shipped and additional FDA Medication Guides as required.       DISEASE/MEDICATION-SPECIFIC INFORMATION        For patients on injectable medications: Patient currently has 1 doses left.  Next injection is scheduled for 03/20/22.    SPECIALTY MEDICATION ADHERENCE     Medication Adherence    Patient reported X missed doses in the last month: 0  Specialty Medication: Dupixent  Patient is on additional specialty medications: No  Informant: patient                          Were doses missed due to medication being on hold? No    Dupixent 300 mg/16ml: 14 days of medicine on hand       REFERRAL TO PHARMACIST     Referral to the pharmacist: Not needed      [facility]     Shipping address confirmed in Epic.     Delivery Scheduled: Yes, Expected medication delivery date: 03/24/22.     Medication will be delivered via UPS to the prescription address in Epic WAM.    Jasper Loser   [facility] Specialty Technician

## 2022-03-21 ENCOUNTER — Ambulatory Visit
Admit: 2022-03-21 | Discharge: 2022-03-22 | Payer: MEDICARE | Attending: Student in an Organized Health Care Education/Training Program | Primary: Student in an Organized Health Care Education/Training Program

## 2022-03-21 NOTE — Unmapped (Unsigned)
No show the lesion) and verbal consent was obtained, identified lesions were treated with liquid nitrogen x 1 ten second freeze-thaw cycle. The patient tolerated the procedure well and was instructed on post-procedure care.  Total AK's frozen:4  Location and number: Chest, LUE, face     Eczematous dermatitis with prurigo nodularis, excoriated lesions   - Continue fluocinonide (LIDEX) 0.05 % ointment; Apply topically Two (2) times a day. To itchy, raised lesions on the legs until improved  - Continue dupilumab 300 mg subcutaneous injection every 14 days, started in April 2023.   - Start mupirocin ointment twice daily to excoriated lesions on face     The patient was advised to call for an appointment should any new, changing, or symptomatic lesions develop.     RTC: 6-8 weeks   _________________________________________________________________      Chief Complaint     Chief Complaint   Patient presents with    Skin Problem     5 FU        HPI     Lindsay Webster is a 61 y.o. female who presents as a returning patient (last seen 01/31/2022) to Dermatology for follow up of below.      1.) Follow up eczematous dermatitis, prurigo nodularis. At last visit, continued fluocinonide 0.05%. Also continued on dupixent 300 mg q2 weeks. 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 Previously treated with IL 5 FU. She believes she has had improvement in lesions while on dupixent. She has hypertrophic scars secondary to this, managed with intra-lesional kenalog.     She also reports that following her last visit (where we diagnosed notalgia paraesthetica) she saw her PCP who ordered spinal imaging which has show severe DDD and spurs.  Planning to see neurosurgeon in [place].    The patient denies any other new or changing lesions or areas of concern.     Pertinent Past Medical History     History of skin cancer as outlined below:    Problem List    None      #Eczematous dermatitis, prurigo nodularis   #Notalgia paresthetica     Family History:   Unknown     Past Medical History, Family History, Social History, Medication List, Allergies, and Problem List were reviewed in the rooming section of Epic.     ROS: Other than symptoms mentioned in the HPI, no fevers, chills, or other skin complaints    Physical Examination     GENERAL: Well-appearing female in no acute distress, resting comfortably.  NEURO: Alert and oriented, answers questions appropriately  SKIN (Full Skin Exam): Examination of the face, eyelids, lips, nose, ears, neck, chest, abdomen, back, arms, legs, hands, feet, palms, soles, nails was performed  - Actinic Elastosis: mild chronic sun damage: dyspigmentation, telangiectasia, and wrinkling  - Actinic Keratosis(es): Scaly erythematous macule(s) on the upper extremities  - Nevus/nevi: Scattered well-demarcated, regular, pigmented macule(s) and/or papule(s) on the scattered diffusely  - Seborrheic Keratosis(es): Stuck-on appearing keratotic papule(s) on the scattered diffusely, none irritated with redness, crusting, edema, and/or partial avulsion  - Firm pink papules on lower extremities as site of prior keratoacanthomas  - Scattered eroded pink papules noted diffusely     (Approved Template 02/24/2020)

## 2022-03-23 MED FILL — DUPIXENT 300 MG/2 ML SUBCUTANEOUS PEN INJECTOR: SUBCUTANEOUS | 28 days supply | Qty: 4 | Fill #7

## 2022-04-11 ENCOUNTER — Ambulatory Visit
Admit: 2022-04-11 | Discharge: 2022-04-12 | Payer: MEDICARE | Attending: Student in an Organized Health Care Education/Training Program | Primary: Student in an Organized Health Care Education/Training Program

## 2022-04-11 DIAGNOSIS — L282 Other prurigo: Principal | ICD-10-CM

## 2022-04-11 DIAGNOSIS — L281 Prurigo nodularis: Principal | ICD-10-CM

## 2022-04-11 DIAGNOSIS — L821 Other seborrheic keratosis: Principal | ICD-10-CM

## 2022-04-11 DIAGNOSIS — L578 Other skin changes due to chronic exposure to nonionizing radiation: Principal | ICD-10-CM

## 2022-04-11 DIAGNOSIS — D229 Melanocytic nevi, unspecified: Principal | ICD-10-CM

## 2022-04-11 DIAGNOSIS — L814 Other melanin hyperpigmentation: Principal | ICD-10-CM

## 2022-04-11 MED ADMIN — triamcinolone acetonide (KENALOG) injection 10 mg: 10 mg | INTRA_ARTICULAR | @ 18:00:00 | Stop: 2022-04-11

## 2022-04-11 NOTE — Unmapped (Signed)
Dermatology Note     Assessment and Plan:      Benign Lesions/ Findings:   Actinic Elastosis  Actinic purpura   Lentigines  Seborrheic keratosis   - Reassurance provided regarding the benign appearance of lesions noted on exam today; no treatment is indicated in the absence of symptoms/changes.  - Reinforced importance of photoprotective strategies including liberal and frequent sunscreen use of a broad-spectrum SPF 30 or greater, use of protective clothing, and sun avoidance for prevention of cutaneous malignancy and photoaging.  Counseled patient on the importance of regular self-skin monitoring as well as routine clinical skin examinations as scheduled.     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 Discussed lesions today more consistent with scars and not new skin cancers   - Discussed maintaining vigilance and counseled on sun protection as above.    Eczematous dermatitis with prurigo nodularis, excoriated lesions   - Continue fluocinonide (LIDEX) 0.05 % ointment; Apply topically Two (2) times a day. To itchy, raised lesions on the legs until improved  - Continue dupilumab 300 mg subcutaneous injection every 14 days, started in April 2023.   - intra lesional kenalog injected into two pruritic lesions of R calf, L upper arm    Intralesional Kenalog Procedure Note: After the patient was informed of risks (including atrophy and dyspigmentation), benefits and side effects of intralesional steroid injection, the patient elected to undergo injection and verbal consent was obtained. Skin was cleaned with alcohol and injected intralesionally into the sites (below). The patient tolerated the procedure well without complications and was instructed on post-procedure care.  Location(s): RLE, LUE   Number of sites treated: 2  Kenalog (triamcinolone) Concentration: 10 mg/ml   Volume: 0.4 ml total    The patient was advised to call for an appointment should any new, changing, or symptomatic lesions develop.     RTC: 6-8 weeks   _________________________________________________________________      Chief Complaint     Chief Complaint   Patient presents with    Skin Check     FBSE-WILL DISCUSS       HPI     Lindsay Webster is a 61 y.o. female who presents as a returning patient (last seen 01/31/22) to Dermatology for follow up of below.      1.) Follow up eczematous dermatitis, prurigo nodularis. At last visit, continued fluocinonide 0.05% and dupixent 300 mg q2 weeks. She notes improvement in itch with use of the fluocinonide. Endorsing two new pruritic lesions on R calf, L upper arm. Prior lesions that were injected with intra lesional kenalog resolved.     2.) History of non melanoma skin cancer, eruptive keratoacanthomas in setting of trauma. Previously treated with IL 5 FU. She believes she has had improvement in lesions while on dupixent.      The patient denies any other new or changing lesions or areas of concern.     Pertinent Past Medical History     History of skin cancer as outlined below:    Problem List    None      #Eczematous dermatitis, prurigo nodularis   #Notalgia paresthetica     Family History:   Unknown     Past Medical History, Family History, Social History, Medication List, Allergies, and Problem List were reviewed in the rooming section of Epic.     ROS: Other than symptoms mentioned in the HPI,  no fevers, chills, or other skin complaints    Physical Examination     GENERAL: Well-appearing female in no acute distress, resting comfortably.  NEURO: Alert and oriented, answers questions appropriately  SKIN (Full Skin Exam): Examination of the face, eyelids, lips, nose, ears, neck, chest, abdomen, back, arms, legs, hands, feet, palms, soles, nails was performed  - Actinic Elastosis: mild chronic sun damage: dyspigmentation, telangiectasia, and wrinkling  - Actinic purpura: scattered purple patches on upper extremities  - Numerous brown -tan macules and patches diffusely  - Several brown stuck on papules   - pink/purple dome shaped eroded papules and plaques on L upper arm, R calf      (Approved Template 02/24/2020)

## 2022-04-11 NOTE — Unmapped (Signed)
It was nice to see you today! Your resident physician was Dr. Donda Friedli    If any of your medications are too expensive, look for a coupon at GoodRx.com or reference the application on a smartphone  - Enter the medication name, size, and your zip code to find coupons for local pharmacies.   - Print a coupon and bring it to the pharmacy, or pull up the coupon on a smartphone.  - You can also call pharmacies to ask about the cost of your medication before you pick it up.  If you still cannot afford your medication, please let us know.     Please call our clinic at [phone] with any concerns or to schedule a follow up appointment. We look forward to seeing you again!    [facility] releases most results to you as soon as they are available. Therefore, you may see some results before we do. Please give us 2 business days to review the tests and contact you by phone or through MyChart. If you are concerned that some results may be upsetting or confusing, you may wish to wait until we contact you before looking at the report in MyChart. If you have an urgent question, you can send us a message or call our clinic. Otherwise, we prefer that you wait 2 business days for us to contact you.

## 2022-04-22 NOTE — Unmapped (Signed)
Lindsay Webster reports her PN/eczema is stable. She's had a lot of improvement with her nodules, and she uses topical steroids/getting ILK prn.     She has noticed some headaches recently for a few days post-dose. We discussed she could try to pre-medicate with her OTC medications.    [facility] Clinical Assessment & Refill Coordination Note    Monie Strozewski, DOB: [birth year]  Phone: [phone] (home)     All above HIPAA information was verified with patient.     Was a Nurse, learning disability used for this call? No    Specialty Medication(s):   Inflammatory Disorders: Dupixent     Current Outpatient Medications   Medication Sig Dispense Refill    albuterol HFA 90 mcg/actuation inhaler 1 puff as needed      ammonium lactate (LAC-HYDRIN) 12 % lotion       ascorbic acid, vitamin C, (VITAMIN C) 1000 MG tablet Take 1 tablet (1,000 mg total) by mouth.      CALCIUM ACETATE ORAL Take by mouth.      calcium citrate-vitamin D3 500 mg-12.5 mcg /5 gram Powd Take by mouth.      calcium-vitamin D 500 mg(1,250mg ) -200 unit per tablet Take 2 tablets by mouth in the morning and 2 tablets in the evening. Take with meals.      cholecalciferol, vitamin D3-25 mcg, 1,000 unit,, 25 mcg (1,000 unit) capsule Take 5 capsules (125 mcg total) by mouth daily.      cyanocobalamin 1,000 mcg/mL injection Inject into the muscle every thirty (30) days.      cyclobenzaprine (FLEXERIL) 10 MG tablet   0    DENTA 5000 PLUS 1.1 % Crea USE DAILY ON TOOTHBRUSH AFTER REGULAR BRUSHING & FLOSSING DON TEAT OR DRINK FOR 30 MINUTES AFTER USE      divalproex (DEPAKOTE) 250 MG DR tablet TAKE 1 TABLET IN THE MORNING AND TAKE 2 TABLETS IN THE EVENING 90 tablet 6    DULoxetine (CYMBALTA) 30 MG capsule Take 1 capsule (30 mg total) by mouth.      DULoxetine (CYMBALTA) 60 MG capsule Take 1 capsule (60 mg total) by mouth.      dupilumab 300 mg/2 mL PnIj Inject the contents of 1 pen (300 mg) under the skin every fourteen (14) days. 4 mL 11    empty container (SHARPS-A-GATOR DISPOSAL SYSTEM) Misc Use as directed 1 each 0    EPINEPHrine (EPIPEN) 0.3 mg/0.3 mL injection Inject 0.3 mL (0.3 mg total) into the muscle.      famotidine (PEPCID) 40 MG tablet       fluocinonide (LIDEX) 0.05 % ointment Apply topically Two (2) times a day. To itchy, raised lesions on the legs until improved 60 g 2    fluorometholone (FML) 0.1 % ophthalmic suspension INSTILL ONE DROP IN EACH EYE AS NEEDED AS DIRECTED      fremanezumab-vfrm (AJOVY AUTOINJECTOR) 225 mg/1.5 mL AtIn Inject 225 mg under the skin every twenty-eight (28) days.      gabapentin (NEURONTIN) 100 MG capsule TAKE ONE CAPSULE 3 TIMES A DAY  0    galcanezumab-gnlm (EMGALITY PEN) 120 mg/mL injection       GLUC SU/CHONDRO SU A/VIT C/MN (GLUCOSAMINE CHONDROITIN MAXSTR ORAL) Take 1,500 Int'l Units by mouth daily at 0600.      glucosamine-chondroitin 500-400 mg tablet Take 1 tablet by mouth in the morning.      Lactobacillus acidophilus Powd Take 1 capsule by mouth.      Lactobacillus rhamnosus GG (  CULTURELLE) 10 billion cell capsule Take 1 capsule by mouth daily.      LORazepam (ATIVAN) 1 MG tablet Take 1 tablet (1 mg total) by mouth daily.  2    MAGNESIUM ORAL Take 500 mg by mouth daily.      meclizine (ANTIVERT) 25 mg tablet Take 1 tablet (25 mg total) by mouth.      meloxicam (MOBIC) 15 MG tablet Take 1 tablet (15 mg total) by mouth daily.      metoprolol tartrate (LOPRESSOR) 25 MG tablet TAKE 1 TABLET BY MOUTH TWICE A DAY      mupirocin (BACTROBAN) 2 % ointment Apply topically Three (3) times a day. To affected area till healed 15 g 1    MYRBETRIQ 50 mg Tb24 extended-release tablets Take 1 tablet (50 mg total) by mouth daily.  2    naloxegoL (MOVANTIK) tablet       omeprazole (PRILOSEC) 40 MG capsule TAKE 1 CAPSULE TWICE A DAY 30 MINUTES BEFORE A MEAL 60 capsule 2    oxyCODONE (OXYCONTIN) 10 mg TR12 12 hour crush resistant tablet Take 1 tablet (10 mg total) by mouth.      polyethylene glycol (MIRALAX) 17 gram packet Take 17 g by mouth daily as needed.      promethazine (PHENERGAN) 25 MG tablet Take 1 tablet (25 mg total) by mouth every eight (8) hours as needed for nausea. 60 tablet 1    spironolactone (ALDACTONE) 25 MG tablet       triamcinolone (KENALOG) 0.1 % ointment Apply topically Two (2) times a day. To itchy spots as needed 80 g 2    TRIAMCINOLONE ACETONIDE (NASACORT NASL) 1 application. into each nostril two (2) times a day as needed.      venlafaxine (EFFEXOR-XR) 150 MG 24 hr capsule Take 1 capsule (150 mg total) by mouth daily.  2    vitamin E-268 mg, 400 UNIT, 268 mg (400 UNIT) capsule Take 1 capsule (268 mg total) by mouth.      zinc sulfate (ZINCATE) 220 mg (50 mg elemental zinc) capsule Take 50 mg by mouth daily.       No current facility-administered medications for this visit.        Changes to medications: Maren reports no changes at this time.    Allergies   Allergen Reactions    Bee Pollens Swelling    Codeine Hives    Penicillins Hives    Sulfa (Sulfonamide Antibiotics) Anaphylaxis and Shortness Of Breath     Other reaction(s): Unknown    Venom-Honey Bee Swelling     Respiratory   Other reaction(s): Unknown    Zonisamide Anaphylaxis     Headache medication    Carisoprodol      Other reaction(s): rash    Penicillin      Other reaction(s): Unknown    Hydrocodone Nausea And Vomiting       Changes to allergies: No    SPECIALTY MEDICATION ADHERENCE   Dupixent - 0 left  Medication Adherence    Patient reported X missed doses in the last month: 0  Specialty Medication: Dupixent                            Specialty medication(s) dose(s) confirmed: Regimen is correct and unchanged.     Are there any concerns with adherence? No    Adherence counseling provided? Not needed    CLINICAL MANAGEMENT AND INTERVENTION  Clinical Benefit Assessment:    Do you feel the medicine is effective or helping your condition? Yes    Clinical Benefit counseling provided? Not needed    Adverse Effects Assessment:    Are you experiencing any side effects?  Yes, potentially - headache for a few days with each dose. We discussed she could try pre-meds.     Are you experiencing difficulty administering your medicine? No    Quality of Life Assessment:    Quality of Life    Rheumatology  Oncology  Dermatology  1. What impact has your specialty medication had on the symptoms of your skin condition (i.e. itchiness, soreness, stinging)?: Some  2. What impact has your specialty medication had on your comfort level with your skin?: Some  Cystic Fibrosis          How many days over the past month did your PN  keep you from your normal activities? For example, brushing your teeth or getting up in the morning. 0    Have you discussed this with your provider? Not needed    Acute Infection Status:    Acute infections noted within Epic:  No active infections  Patient reported infection: None    Therapy Appropriateness:    Is therapy appropriate and patient progressing towards therapeutic goals? Yes, therapy is appropriate and should be continued    DISEASE/MEDICATION-SPECIFIC INFORMATION      For patients on injectable medications: Patient currently has 0 doses left.  Next injection is scheduled for 11/19.    Chronic Inflammatory Diseases: Have you experienced any flares in the last month? No  Has this been reported to your provider? No    PATIENT SPECIFIC NEEDS     Does the patient have any physical, cognitive, or cultural barriers? No    Is the patient high risk? No    Did the patient require a clinical intervention? No    Does the patient require physician intervention or other additional services (i.e., nutrition, smoking cessation, social work)? No    SOCIAL DETERMINANTS OF HEALTH     At the [facility], we have learned that life circumstances - like trouble affording food, housing, utilities, or transportation can affect the health of many of our patients.   That is why we wanted to ask: are you currently experiencing any life circumstances that are negatively impacting your health and/or quality of life? Patient declined to answer    Social Determinants of Health     Financial Resource Strain: Not on file   Internet Connectivity: Not on file   Food Insecurity: Not on file   Tobacco Use: High Risk (04/11/2022)    Patient History     Smoking Tobacco Use: Every Day     Smokeless Tobacco Use: Never     Passive Exposure: Not on file   Housing/Utilities: Not on file   Alcohol Use: Not on file   Transportation Needs: Not on file   Substance Use: Not on file   Health Literacy: Not on file   Physical Activity: Not on file   Interpersonal Safety: Not on file   Stress: Not on file   Intimate Partner Violence: Not on file   Depression: Not on file   Social Connections: Not on file       Would you be willing to receive help with any of the needs that you have identified today? Not applicable       SHIPPING     Specialty Medication(s) to be  Shipped:   Inflammatory Disorders: Dupixent    Other medication(s) to be shipped: No additional medications requested for fill at this time     Changes to insurance: No    Delivery Scheduled: Yes, Expected medication delivery date: 11/15.     Medication will be delivered via UPS to the confirmed prescription address in [facility].    The patient will receive a drug information handout for each medication shipped and additional FDA Medication Guides as required.  Verified that patient has previously received a Conservation officer, historic buildings and a Surveyor, mining.    The patient or caregiver noted above participated in the development of this care plan and knows that they can request review of or adjustments to the care plan at any time.      All of the patient's questions and concerns have been addressed.    Lanney Gins   [facility] Specialty Pharmacist

## 2022-04-26 MED FILL — DUPIXENT 300 MG/2 ML SUBCUTANEOUS PEN INJECTOR: SUBCUTANEOUS | 28 days supply | Qty: 4 | Fill #8

## 2022-05-22 NOTE — Unmapped (Signed)
Dermatology Note     Assessment and Plan:      Benign Lesions/ Findings:   Actinic Elastosis  Nevus/Nevi-Benign Appearing  Seborrheic Keratosis(es) - irritation of some noted  Actinic Purpura  Xerosis  - Reassurance provided regarding the benign appearance of lesions noted on exam today; no treatment is indicated in the absence of symptoms/changes.  - Reinforced importance of photoprotective strategies including liberal and frequent sunscreen use of a broad-spectrum SPF 30 or greater, use of protective clothing, and sun avoidance for prevention of cutaneous malignancy and photoaging.  Counseled patient on the importance of regular self-skin monitoring as well as routine clinical skin examinations as scheduled.     Neoplasm(ia) of unspecified etiology: LOC 1  - To help confirm diagnosis, biopsy/biopsies obtained today.   Biopsy (Shave) Procedure Note:   After R/B/A discussed (including scarring, pigment alteration, recurrence, or persistence of the lesion) and consent was obtained, the area was marked and photographed. Time Out verification of patient, procedure, and site was performed. When applicable, safety precautions based on patient's medical history or medication use and review of relevant images and results was also performed as part of the Time Out. Site was then prepped with alcohol, and anesthetized with lidocaine 2% with epinephrine. Biopsy(ies) performed using a shave technique. Hemostasis was achieved with pressure, aluminum chloride, Monsel's, and/or electrocautery. Area was dressed with petrolatum and bandage. Wound care instructions were provided. We will contact the patient with results when available. Patient agrees to be notified of results by MyChart - even if needs further management.   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 Discussed lesions today more consistent with scars and not new skin cancers   - Discussed maintaining vigilance and counseled on sun protection as above.    Eczematous dermatitis with prurigo nodularis, excoriated lesions   - Continue fluocinonide (LIDEX) 0.05 % ointment; Apply topically Two (2) times a day. To itchy, raised lesions on the legs until improved  - Continue dupilumab 300 mg subcutaneous injection every 14 days, started in April 2023. Has been improving with IGA score now at 1 compared to 4  - Has previously responded well to intra-lesional kenalog, will repeat today    Intralesional Kenalog Procedure Note: After the patient was informed of risks (including atrophy and dyspigmentation), benefits and side effects of intralesional steroid injection, the patient elected to undergo injection and verbal consent was obtained. Skin was cleaned with alcohol and injected intralesionally into the sites (below). The patient tolerated the procedure well without complications and was instructed on post-procedure care.  Location(s): RLE  Number of sites treated: 1  Kenalog (triamcinolone) Concentration: 40 mg/ml   Volume: 0.2 ml total    The patient was advised to call for an appointment should any new, changing, or symptomatic lesions develop.     RTC: Return in about 2 months (around 07/24/2022) for Recheck. or sooner as needed   _________________________________________________________________      Chief Complaint     Chief Complaint   Patient presents with    Follow-up       HPI     Lindsay Webster is a 61 y.o. female who presents as a returning patient (last seen 04/11/2022) to Dermatology for follow up of prurigo nodularis. At last visit patient was continued on dupilumab, lidex ointment,  and was injected with intra-lesional kenalog.    Today, patient reports the following:   - skin is horrible  - Still on dupilumab  - Wants IL5FU infections  - Still picking at sites, did not try NAC  - Said she broke out in rash with Covid in November but has since resolved    The patient denies any other new or changing lesions or areas of concern.     Pertinent Past Medical History     Past Medical History, Family History, Social History, Medication List, Allergies, and Problem List were reviewed in the rooming section of Epic.     ROS: Other than symptoms mentioned in the HPI, no fevers, chills, or other skin complaints    Physical Examination     GENERAL: Well-appearing female in no acute distress, resting comfortably.  NEURO: Alert and oriented, answers questions appropriately  PSYCH: Normal mood and affect  RESP: No increased work of breathing  SKIN (Focal Skin Exam): Per patient request, examination of extremities and trunk was performed  - Actinic Elastosis: moderate chronic sun damage: dyspigmentation, telangiectasia, and wrinkling  - Seborrheic Keratosis(es): Stuck-on appearing keratotic papule(s) on the trunk, upper extremities, and scattered diffusely, a few irritated with redness, crusting, edema, and/or partial avulsion  - Actinic Purpura: Diffuse atrophy with scattered purpuric patches 1-3 cm in size on the bilateral forearms and dorsal hands. There is evidence of post-inflammatory hyperpigmentation with an orange-brown color change  - Multiple excoriated crusted erythematous papules upper and lower extremities    All areas not commented on are within normal limits or unremarkable      (Approved Template 02/24/2020)

## 2022-05-23 ENCOUNTER — Ambulatory Visit: Admit: 2022-05-23 | Discharge: 2022-05-24 | Payer: MEDICARE

## 2022-05-23 DIAGNOSIS — D492 Neoplasm of unspecified behavior of bone, soft tissue, and skin: Principal | ICD-10-CM

## 2022-05-23 DIAGNOSIS — D692 Other nonthrombocytopenic purpura: Principal | ICD-10-CM

## 2022-05-23 DIAGNOSIS — D229 Melanocytic nevi, unspecified: Principal | ICD-10-CM

## 2022-05-23 DIAGNOSIS — L820 Inflamed seborrheic keratosis: Principal | ICD-10-CM

## 2022-05-23 DIAGNOSIS — L282 Other prurigo: Principal | ICD-10-CM

## 2022-05-23 DIAGNOSIS — L578 Other skin changes due to chronic exposure to nonionizing radiation: Principal | ICD-10-CM

## 2022-05-23 MED ADMIN — triamcinolone acetonide (KENALOG-40) injection 40 mg: 40 mg | @ 20:00:00 | Stop: 2022-05-23

## 2022-05-23 NOTE — Unmapped (Signed)
Start NAC 1000 mg twice daily. Here is a picture:

## 2022-05-31 NOTE — Unmapped (Signed)
The [facility] has made a second and final attempt to reach this patient to refill the following medication:Dupixent.      We have been unable to leave messages on the following phone numbers: [phone], have sent a text message to the following phone numbers: [phone], and have sent a Mychart questionnaire..    Dates contacted: 12/4 and 12/19  Last scheduled delivery: 04/27/22    The patient may be at risk of non-compliance with this medication. The patient should call the [facility] at [phone]  Option 4, then Option 2 (all other specialty patients) to refill medication.    Unk Lightning   [facility] Specialty Technician

## 2022-07-05 ENCOUNTER — Telehealth: Payer: Self-pay

## 2022-07-05 NOTE — Telephone Encounter (Signed)
Per Dr. Harriet Masson: She needs an appt. He pcp send me the monitor results from what he applied in 05/2022 - it abnormal and I would like to discuss it with her.   KT   Called pt. Made appt for next week to see Dr. Harriet Masson.

## 2022-07-13 ENCOUNTER — Ambulatory Visit: Payer: Medicare HMO | Admitting: Cardiology

## 2022-07-21 NOTE — Unmapped (Signed)
Dermatology Note     Assessment and Plan:      Benign Lesions/ Findings:   Actinic Elastosis  Nevus/Nevi-Benign Appearing  Seborrheic Keratosis(es) - irritation of some noted  Actinic Purpura  Xerosis  - Reassurance provided regarding the benign appearance of lesions noted on exam today; no treatment is indicated in the absence of symptoms/changes.  - Reinforced importance of photoprotective strategies including liberal and frequent sunscreen use of a broad-spectrum SPF 30 or greater, use of protective clothing, and sun avoidance for prevention of cutaneous malignancy and photoaging.  Counseled patient on the importance of regular self-skin monitoring as well as routine clinical skin examinations as scheduled.     Neoplasm(ia) of unspecified etiology: LOC 1  - To help confirm diagnosis, biopsy/biopsies obtained today.   Biopsy (Shave) Procedure Note:   After R/B/A discussed (including scarring, pigment alteration, recurrence, or persistence of the lesion) and consent was obtained, the area was marked and photographed. Time Out verification of patient, procedure, and site was performed. When applicable, safety precautions based on patient's medical history or medication use and review of relevant images and results was also performed as part of the Time Out. Site was then prepped with alcohol, and anesthetized with lidocaine 2% with epinephrine. Biopsy(ies) performed using a shave technique. Hemostasis was achieved with pressure, aluminum chloride, Monsel's, and/or electrocautery. Area was dressed with petrolatum and bandage. Wound care instructions were provided. We will contact the patient with results when available. Patient agrees to be notified of results by MyChart - even if needs further management.   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 Discussed lesions today more consistent with scars and not new skin cancers   - Discussed maintaining vigilance and counseled on sun protection as above.    Eczematous dermatitis with prurigo nodularis, excoriated lesions   - Continue fluocinonide (LIDEX) 0.05 % ointment; Apply topically Two (2) times a day. To itchy, raised lesions on the legs until improved  - Continue dupilumab 300 mg subcutaneous injection every 14 days, started in April 2023. Has been improving with IGA score now at 1 compared to 4  - Has previously responded well to intra-lesional kenalog, will repeat today    Intralesional Kenalog Procedure Note: After the patient was informed of risks (including atrophy and dyspigmentation), benefits and side effects of intralesional steroid injection, the patient elected to undergo injection and verbal consent was obtained. Skin was cleaned with alcohol and injected intralesionally into the sites (below). The patient tolerated the procedure well without complications and was instructed on post-procedure care.  Location(s): RLE  Number of sites treated: 1  Kenalog (triamcinolone) Concentration: 40 mg/ml   Volume: 0.2 ml total    The patient was advised to call for an appointment should any new, changing, or symptomatic lesions develop.     RTC: No follow-ups on file. or sooner as needed   _________________________________________________________________      Chief Complaint     No chief complaint on file.      HPI     Lindsay Webster is a 62 y.o. female who presents as a returning patient (last seen 05/23/2022) to Dermatology for follow up of prurigo nodularis. At last visit patient was continued on dupilumab, lidex ointment, and was injected with intra-lesional kenalog.    Today, patient reports  the following:   - skin is horrible  - Still on dupilumab  - Wants IL5FU infections  - Still picking at sites, did not try NAC  - Said she broke out in rash with Covid in November but has since resolved    The patient denies any other new or changing lesions or areas of concern.     Pertinent Past Medical History     Past Medical History, Family History, Social History, Medication List, Allergies, and Problem List were reviewed in the rooming section of Epic.     ROS: Other than symptoms mentioned in the HPI, no fevers, chills, or other skin complaints    Physical Examination     GENERAL: Well-appearing female in no acute distress, resting comfortably.  NEURO: Alert and oriented, answers questions appropriately  PSYCH: Normal mood and affect  RESP: No increased work of breathing  SKIN (Focal Skin Exam): Per patient request, examination of extremities and trunk was performed  - Actinic Elastosis: moderate chronic sun damage: dyspigmentation, telangiectasia, and wrinkling  - Seborrheic Keratosis(es): Stuck-on appearing keratotic papule(s) on the trunk, upper extremities, and scattered diffusely, a few irritated with redness, crusting, edema, and/or partial avulsion  - Actinic Purpura: Diffuse atrophy with scattered purpuric patches 1-3 cm in size on the bilateral forearms and dorsal hands. There is evidence of post-inflammatory hyperpigmentation with an orange-brown color change  - Multiple excoriated crusted erythematous papules upper and lower extremities    All areas not commented on are within normal limits or unremarkable      (Approved Template 02/24/2020)

## 2022-07-25 ENCOUNTER — Ambulatory Visit: Admit: 2022-07-25 | Discharge: 2022-07-26 | Payer: MEDICARE

## 2022-07-25 DIAGNOSIS — L282 Other prurigo: Principal | ICD-10-CM

## 2022-07-25 DIAGNOSIS — L281 Prurigo nodularis: Principal | ICD-10-CM

## 2022-07-25 DIAGNOSIS — L858 Other specified epidermal thickening: Principal | ICD-10-CM

## 2022-07-25 DIAGNOSIS — Z85828 Personal history of other malignant neoplasm of skin: Principal | ICD-10-CM

## 2022-07-25 MED ORDER — FLUOROURACIL 5 % TOPICAL CREAM
0 refills | 0.00000 days | Status: CP
Start: 2022-07-25 — End: ?

## 2022-07-25 MED ORDER — DUPILUMAB 300 MG/2 ML SUBCUTANEOUS PEN INJECTOR
SUBCUTANEOUS | 11 refills | 28.00000 days | Status: CP
Start: 2022-07-25 — End: 2023-07-25
  Filled 2022-07-28: qty 4, 28d supply, fill #0

## 2022-07-26 NOTE — Unmapped (Signed)
Addended by: Inis Sizer A on: 07/26/2022 03:39 PM     Modules accepted: Level of Service

## 2022-07-26 NOTE — Unmapped (Signed)
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    - concern for superficial changes concerning for regrowth. Discussed treatment options including Intralesional 5-Fluorouracil vs topical chemotherapy. Discussed risks, benefits and side effects and patient made informed decision to treat with topical chemotherapy cream.  - start [facility], item [phone] : Fluorouracil 5% Calcipotriene 0.005% cream: Apply twice a day to pre-cancer/early cancer lesions for 5 days. Sent 30g. Patient expressed understanding and agreement to out-of-pocket cost of prescription.  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  - Discussed maintaining vigilance and counseled on sun protection as above.  - Recommend starting niacinamide 500 mg Oral Two (2) times a day to reduce skin cancer risk    Eczematous dermatitis with prurigo nodularis, excoriated lesions   - Continue fluocinonide (LIDEX) 0.05 % ointment; Apply topically Two (2) times a day. To itchy, raised lesions on the legs until improved  - Continue dupilumab 300 mg subcutaneous injection every 14 days, started in April 2023. Has been improving with IGA score now at 1 compared to 4. Refilled today.  - Has previously responded well to intra-lesional kenalog for acute lesions; would reconsider when actively flaring. In the future.     Actinic purpura   - Explained to the patient that this is related to skin fragility due to aging and years of sun exposure  - Explained unclear if association to history of sclerotherapy  - Use adequate broad spectrum sun protection with an SPF of at least a 30  - Avoid trauma  - Consider over-the-counter moisturizers    The patient was advised to call for an appointment should any new, changing, or symptomatic lesions develop.     RTC: Return in about 6 weeks (around 09/05/2022) for In-person. or sooner as needed   _________________________________________________________________      Chief Complaint     No chief complaint on file.      HPI     Lindsay Webster is a 62 y.o. female who presents as a returning patient (last seen 05/23/2022) to Dermatology for follow up of prurigo nodularis and lesion of concern. She has not been able to get her dupilumab (Dupixent) since her last visit. She was told by the pharmacy that she needs new paperwork. She also has generalized itching and thinks that some of the lesions could be returning.    Skin Lesion of Concern  Location: right forearm  Duration: multiple weeks  Character: seems to be regrowing the skin cancer diagnosed there during the last visit   Pain or tendernes: No  Bleeding: No  Pruritus: No  Treatment(s): skin biopsy during last visit      The patient denies any other new or changing lesions or areas of concern.     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     ROS: Other than symptoms mentioned in the HPI, no fevers, chills, or other skin complaints    Physical Examination GENERAL: Well-appearing female in no acute distress, resting comfortably.  NEURO: Alert and oriented, answers questions appropriately  PSYCH: Normal mood and affect  EYES: lids clear, conjunctiva pink, no scleral icterus or other color change  RESP: No increased work of breathing  SKIN (Focal Skin Exam): Per patient request, examination of forearms, dorsal hands and bilateral distal lower extremities was performed    - erythematous scaly plaque over scar on right lateral mid forearm (lesion of concern)  - diffuse xerosis  - diffuse atrophy with scattered purpuric  patches 1-3 cm in size on the bilateral forearms < distal lower extremities.  There is evidence of post-inflammatory hyperpigmentation with an orange-brown color change    All areas not commented on are within normal limits or unremarkable      (Approved Template 02/24/2020)

## 2022-07-26 NOTE — Unmapped (Addendum)
Prurigo nodularis is being treated with dupilumab (Dupixent)   2. Sent new dupilumab (Dupixent) injections  3. Start the compounded topical chemotherapy cream called Fluorouracil 5% Calcipotriene 0.005% cream for the spot on the right forearm.  4. Start Niacinamide 500mg  2x/day to reduce skin cancer risk

## 2022-07-28 NOTE — Unmapped (Signed)
Returned call to patient after she LVM on nurse line stating she wanted her compound that was sent to [place] to be sent to her local pharmacy because they do compound medications.    Per patient, [facility] charges $35 in shipping plus the amount of the compound.  Would rather have her regular pharmacy handle this .  ([facility])    Clarified with patient the Dupixent dose as 300mg  every 14 days.  Patient was confused because it showed there was a change in Dupixent on her AVS.    Patient appreciated the call back.

## 2022-07-28 NOTE — Unmapped (Signed)
[facility] Refill Coordination Note    Specialty Medication(s) to be Shipped:   Inflammatory Disorders: Dupixent    Other medication(s) to be shipped: No additional medications requested for fill at this time     Mariene Siggers, DOB: 1960/07/17  Phone: [phone] (home)       All above HIPAA information was verified with patient.     Was a Nurse, learning disability used for this call? No    Completed refill call assessment today to schedule patient's medication shipment from the [facility] [phone]).  All relevant notes have been reviewed.     Specialty medication(s) and dose(s) confirmed: Regimen is correct and unchanged.   Changes to medications: Grettel reports no changes at this time.  Changes to insurance: No  New side effects reported not previously addressed with a pharmacist or physician: None reported  Questions for the pharmacist: No    Confirmed patient received a Conservation officer, historic buildings and a Surveyor, mining with first shipment. The patient will receive a drug information handout for each medication shipped and additional FDA Medication Guides as required.       DISEASE/MEDICATION-SPECIFIC INFORMATION        For patients on injectable medications: Patient currently has 0 doses left.  Next injection is scheduled for 08/01/22.    SPECIALTY MEDICATION ADHERENCE     Medication Adherence    Patient reported X missed doses in the last month: 0  Specialty Medication: DUPIXENT PEN 300 mg/2 mL Pnij (dupilumab)  Patient is on additional specialty medications: No              Were doses missed due to medication being on hold? No        REFERRAL TO PHARMACIST     Referral to the pharmacist: Not needed      [facility]     Shipping address confirmed in Epic.     Patient was notified of new phone menu : No    Delivery Scheduled: Yes, Expected medication delivery date: 07/29/22.     Medication will be delivered via UPS to the prescription address in Epic WAM.    Quintella Reichert   [facility] Specialty Technician

## 2022-07-29 DIAGNOSIS — L858 Other specified epidermal thickening: Principal | ICD-10-CM

## 2022-07-29 MED ORDER — FLUOROURACIL 5 % TOPICAL CREAM
0 refills | 0.00000 days
Start: 2022-07-29 — End: ?

## 2022-07-29 NOTE — Unmapped (Signed)
Patient would like to try the cream. She is willing to pay the shipping fee for the medication.  Patient declined scheduling an earlier appointment at this time.    Can you reach out to patient to discuss?    Thanks,  Bear Stearns

## 2022-07-29 NOTE — Unmapped (Signed)
Call from patient.  Requests that the compounded cream be sent to [facility] in [place].  They do compounds per patient .      Pended Rx to go to [facility]

## 2022-08-01 MED ORDER — FLUOROURACIL 5 % TOPICAL CREAM
0 refills | 0.00000 days | Status: CP
Start: 2022-08-01 — End: ?

## 2022-08-09 ENCOUNTER — Ambulatory Visit: Payer: Medicare HMO | Attending: Cardiology | Admitting: Cardiology

## 2022-08-11 NOTE — Unmapped (Signed)
Spoke with patient by phone 08/05/22:  - Patient reports she has restarted dupilumab, has obtained the compounded Efudex/calcipotriene, and started the NAC  - Notes a spot on her ear that is firm; asked her to send images  - Okay to use Efudex/calcipotriene on new spots

## 2022-08-18 ENCOUNTER — Ambulatory Visit: Admit: 2022-08-18 | Discharge: 2022-08-19 | Payer: MEDICARE

## 2022-08-18 DIAGNOSIS — L249 Irritant contact dermatitis, unspecified cause: Principal | ICD-10-CM

## 2022-08-18 DIAGNOSIS — L281 Prurigo nodularis: Principal | ICD-10-CM

## 2022-08-18 DIAGNOSIS — Z85828 Personal history of other malignant neoplasm of skin: Principal | ICD-10-CM

## 2022-08-18 DIAGNOSIS — L821 Other seborrheic keratosis: Principal | ICD-10-CM

## 2022-08-18 DIAGNOSIS — L858 Other specified epidermal thickening: Principal | ICD-10-CM

## 2022-08-18 MED ORDER — FLUOCINONIDE 0.05 % TOPICAL OINTMENT
Freq: Two times a day (BID) | TOPICAL | 2 refills | 0 days
Start: 2022-08-18 — End: 2023-08-18

## 2022-08-18 NOTE — Unmapped (Signed)
Dermatology Note     Assessment and Plan:      Benign Lesions/ Findings:   Seborrheic Keratosis(es) - no irritation noted - multiple lesions of concern   Actinic Purpura   - Reassurance provided regarding the benign appearance of lesions noted on exam today; no treatment is indicated in the absence of symptoms/changes.  - Reinforced importance of photoprotective strategies including liberal and frequent sunscreen use of a broad-spectrum SPF 30 or greater, use of protective clothing, and sun avoidance for prevention of cutaneous malignancy and photoaging.  Counseled patient on the importance of regular self-skin monitoring as well as routine clinical skin examinations as scheduled.     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  - Discussed maintaining vigilance and counseled on sun protection as above.  - Continue niacinamide 500 mg Oral Two (2) times a day to reduce skin cancer risk    The patient was advised to call for an appointment should any new, changing, or symptomatic lesions develop.     RTC: No follow-ups on file. or sooner as needed   _________________________________________________________________      Chief Complaint     No chief complaint on file.      HPI     Lindsay Webster is a 62 y.o. female who presents as a returning patient (last seen 07/25/2022) to Dermatology for  multiple concerns. Lindsay Webster states that she started to notice white spots on lower and upper extremities. Had a reaction to sesame seed oil. Also states that lesion on right arm has resolved since using Efudex.     The patient denies any other new or changing lesions or areas of concern.     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     ROS: Other than symptoms mentioned in the HPI, no fevers, chills, or other skin complaints    Physical Examination     GENERAL: Well-appearing female in no acute distress, resting comfortably.  NEURO: Alert and oriented, answers questions appropriately  PSYCH: Normal mood and affect  EYES: lids clear, conjunctiva pink, no scleral icterus or other color change  RESP: No increased work of breathing  SKIN (Sun exposed Exam): Per patient request, examination  of the face, neck, scalp, bilateral upper extremities, palms, and fingernails was performed  - No residual KA noted on right arm   - Slight erythema noted on lower and upper extremities   - Hyperkeratotic, well-demarcated, stuck-on pigmented papule located diffusely     All areas not commented on are within normal limits or unremarkable      (Approved Template 02/24/2020)

## 2022-08-18 NOTE — Unmapped (Signed)
Called, left voicemail    Thanks,  Courtney

## 2022-08-19 MED ORDER — FLUOCINONIDE 0.05 % TOPICAL OINTMENT
Freq: Two times a day (BID) | TOPICAL | 2 refills | 0 days | Status: CP
Start: 2022-08-19 — End: 2023-08-19

## 2022-08-20 NOTE — Unmapped (Signed)
I saw and evaluated the patient, participating in the key elements of the service. I discussed the findings, assessment and plan with the resident and agree with resident’s findings and plan as documented in the resident’s note. I was present for the entirety of the procedure(s).

## 2022-08-22 NOTE — Unmapped (Signed)
[facility] Refill Coordination Note    Specialty Medication(s) to be Shipped:   Inflammatory Disorders: Dupixent    Other medication(s) to be shipped: No additional medications requested for fill at this time     Lindsay Webster, DOB: [birth year]  Phone: [phone] (home)       All above HIPAA information was verified with patient.     Was a Nurse, learning disability used for this call? No    Completed refill call assessment today to schedule patient's medication shipment from the [facility] [phone]).  All relevant notes have been reviewed.     Specialty medication(s) and dose(s) confirmed: Regimen is correct and unchanged.   Changes to medications: Lindsay Webster reports no changes at this time.  Changes to insurance: No  New side effects reported not previously addressed with a pharmacist or physician: None reported  Questions for the pharmacist: No    Confirmed patient received a Conservation officer, historic buildings and a Surveyor, mining with first shipment. The patient will receive a drug information handout for each medication shipped and additional FDA Medication Guides as required.       DISEASE/MEDICATION-SPECIFIC INFORMATION        For patients on injectable medications: Patient currently has 0 doses left.  Next injection is scheduled for 08/28/2022.    SPECIALTY MEDICATION ADHERENCE     Medication Adherence    Patient reported X missed doses in the last month: 0  Specialty Medication: Dupixent Pen 300mg /48mL  Patient is on additional specialty medications: No  Informant: patient              Were doses missed due to medication being on hold? No        REFERRAL TO PHARMACIST     Referral to the pharmacist: Not needed      [facility]     Shipping address confirmed in Epic.     Patient was notified of new phone menu : No    Delivery Scheduled: Yes, Expected medication delivery date: 08/25/22.     Medication will be delivered via UPS to the prescription address in Epic WAM.    Lindsay Webster   [facility] Specialty Technician

## 2022-08-24 MED FILL — DUPIXENT 300 MG/2 ML SUBCUTANEOUS PEN INJECTOR: SUBCUTANEOUS | 28 days supply | Qty: 4 | Fill #1

## 2022-09-05 ENCOUNTER — Ambulatory Visit: Admit: 2022-09-05 | Discharge: 2022-09-06 | Payer: MEDICARE

## 2022-09-05 DIAGNOSIS — Z85828 Personal history of other malignant neoplasm of skin: Principal | ICD-10-CM

## 2022-09-05 DIAGNOSIS — L281 Prurigo nodularis: Principal | ICD-10-CM

## 2022-09-05 DIAGNOSIS — L820 Inflamed seborrheic keratosis: Principal | ICD-10-CM

## 2022-09-05 DIAGNOSIS — L309 Dermatitis, unspecified: Principal | ICD-10-CM

## 2022-09-05 DIAGNOSIS — L814 Other melanin hyperpigmentation: Principal | ICD-10-CM

## 2022-09-05 DIAGNOSIS — L821 Other seborrheic keratosis: Principal | ICD-10-CM

## 2022-09-05 DIAGNOSIS — L578 Other skin changes due to chronic exposure to nonionizing radiation: Principal | ICD-10-CM

## 2022-09-05 DIAGNOSIS — L858 Other specified epidermal thickening: Principal | ICD-10-CM

## 2022-09-05 NOTE — Unmapped (Unsigned)
Dermatology Note     Assessment and Plan:      Irritant or allergic contact dermatitis secondary to sesame seed oil:   - resolved on exam today  - no further treatment needed.    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  - Discussed maintaining vigilance and counseled on sun protection as above.  - Continue niacinamide 500 mg Oral Two (2) times a day to reduce skin cancer risk. Discussed that joint symptoms (Assessment & Plan below), less favored to be associated to niacinamide supplement. Offered trial of discontinuation to see if symptoms improve at risk of stopping preventive effects from skin cancer benefits. After a discussion of risks vs benefits, the patient made the informed decision to continue for now.    Eczematous dermatitis with prurigo nodularis, excoriated lesions   - Continue fluocinonide (LIDEX) 0.05 % ointment; Apply topically Two (2) times a day. To itchy, raised lesions on the legs until improved. Patient expressed that no refill(s) needed today.   - Continue dupilumab 300 mg subcutaneous injection every 14 days, started in April 2023. Has been improving with IGA score now at 1 compared to 4. Patient expressed that no refill(s) needed today.   - Has previously responded well to intra-lesional kenalog for acute lesions; would reconsider when actively flaring in the future. Last Intralesional Kenalog treatment on 05/23/2022 visit.    Seborrheic keratoses and stucco keratosis on the dorsal feet and bilateral distal lower extremities  - Patient has been using a fine shaving blade to thin out the lesions and shaving th hairs and thinks it has helped.   - discussed concern for skin trauma from shaving blade and risk of triggering/causing the eruptive prurigo nodularis or Keratoacanthomas    Lesion of concern of left ear:  - patient used 5 days of the compounded fluorouracil (Efudex) 5%/calcipotriene  0.005% cream  - no residual lesion on exam today. Based on clearance of lesions with treatment, suspect Actinic Keratosis or early Keratoacanthoma but we cannot know definitely.  - no further treatment recommended.    Lesion of concern of right shin, 2 lesions of concern on left dorsal forearm, 1 lesion of concern on right forearm:  - concern for partially treated/early Keratoacanthoma, recommend 5-10 days of the compounded fluorouracil (Efudex) 5%/calcipotriene  0.005% cream.    Arthralgias  - Patient expressed debilitating pain of hands, wrist, shoulders, and hips amongst other joints.  - New labs sent via fax by PCP showing no evidence of lab markers for Systemic Lupus Erythematosus/Dermatomyositis.  - Recommend PCP consider checking anti-citrullinated peptide antibodies and rheumatoid factor antibodies to rule out Rheumatoid Arthritis.  - Discussed extensively that joint pain not a common side effects of dupilumab (Dupixent) and not seen in the clinic commonly, but given small rate of cases reported, cannot definitely rule out. Discussed possibility of discontinuing the dupilumab (Dupixent) to see if any improvement. Patient clarified that during the previous use of dupilumab (Dupixent) from February to December 2023 she did not get these arthralgias. During the period from December to February, she did not experience the arthralgias. After an extensive discussion, the patient made the informed decision to wait for lab results from PCP work-up for Rheumatoid Arthritis.    The patient was advised to call for an appointment should any new, changing, or symptomatic lesions develop.     RTC: Return in about 5 weeks (around 10/10/2022) for  In-person. or sooner as needed   _________________________________________________________________      Chief Complaint     Chief Complaint   Patient presents with    Dermatitis     Pt states current flares on ear, lower arms, right leg- pt states these are new areas since last visit.  Pt states she keep areas covered and changes wraps about every 5 days.        HPI     Lindsay Webster is a 62 y.o. female who presents as a returning patient (last seen 08/18/2022) to Dermatology for follow up of eczematous dermatitis complicated with eruptive prurigo nodularis and eruptive Keratoacanthomas. Pt states current flares on ear, lower arms, right leg- pt states these are new areas since last visit. Pt states she keep areas covered and changes wraps about every 5 days.    The patient denies any other new or changing lesions or areas of concern.     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     ROS: Other than symptoms mentioned in the HPI, no fevers, chills, or other skin complaints    Physical Examination     GENERAL: Well-appearing female in no acute distress, resting comfortably.  NEURO: Alert and oriented, answers questions appropriately  EYES: lids clear, conjunctiva pink, no scleral icterus or other color change  RESP: No increased work of breathing  SKIN (Focal Skin Exam): Per patient request, examination of face, sun exposed upper extremities, sun exposed distal lower extremities including feet, ears  was performed  - Lentigo/lentigines: Scattered pigmented macules that are tan to brown in color and are somewhat non-uniform in shape and concentrated in the sun-exposed areas of the face, upper extremities, and lower extremities  - Nevus/nevi: Scattered well-demarcated, regular, pigmented macule(s) and/or papule(s) on the upper extremities, lower extremities, and scattered diffusely  - Seborrheic Keratosis(es): Stuck-on appearing keratotic papule(s) on the upper extremities, lower extremities, and scattered diffusely, none irritated with redness, crusting, edema, and/or partial avulsion    - hyperkeratotic papules and plaques, some with erythematous base, located on the bilateral forearms and right shin  - hyperpigmented and erythematous macules and patches on the bilateral forearms and bilateral distal lower extremities  - eczematous dry plaques and patches, with lichenification, hyperpigmentation and some crusting/scabbing, distributed along the bilateral distal lower legs    All areas not commented on are within normal limits or unremarkable    (Approved Template 02/24/2020)

## 2022-09-05 NOTE — Unmapped (Addendum)
-   Recommend rule out of Rheumatoid Arthritis given history of progressing/worsening joint pain; could consider checking anti-citrullinated peptide antibodies, rheumatoid factor antibodies, and ESR (sed rate). Not found on the previous labs sent via fax.  - We did not do any injections during the this visit, okay to proceed with injections if needed for the patient's care.

## 2022-09-12 NOTE — Unmapped (Signed)
Called PCP, Dr. Ardelle Park, to discuss coordination of care.    Discussed that he is getting our notes.  He notes recent diagnosis of costochondritis is being managed with ibuprofen.  He did not have an explanation for her other joint symptoms.

## 2022-09-20 NOTE — Unmapped (Addendum)
[facility] Refill Coordination Note    Specialty Medication(s) to be Shipped:   Inflammatory Disorders: Dupixent    Other medication(s) to be shipped: No additional medications requested for fill at this time     Lindsay Webster, DOB: September 08, 1960  Phone: [phone] (home)       All above HIPAA information was verified with patient.     Was a Nurse, learning disability used for this call? No    Completed refill call assessment today to schedule patient's medication shipment from the [facility] [phone]).  All relevant notes have been reviewed.     Specialty medication(s) and dose(s) confirmed: Regimen is correct and unchanged.   Changes to medications: Radley reports no changes at this time.  Changes to insurance: No  New side effects reported not previously addressed with a pharmacist or physician: None reported  Questions for the pharmacist: No    Confirmed patient received a Conservation officer, historic buildings and a Surveyor, mining with first shipment. The patient will receive a drug information handout for each medication shipped and additional FDA Medication Guides as required.       DISEASE/MEDICATION-SPECIFIC INFORMATION        For patients on injectable medications: Patient currently has 0 doses left.  Next injection is scheduled for 09/25/2022.    SPECIALTY MEDICATION ADHERENCE     Medication Adherence    Patient reported X missed doses in the last month: 0  Specialty Medication: Dupixent Pen 300mg /60mL  Patient is on additional specialty medications: No  Informant: patient              Were doses missed due to medication being on hold? No        REFERRAL TO PHARMACIST     Referral to the pharmacist: Not needed      [facility]     Shipping address confirmed in Epic.     Patient was notified of new phone menu : No    Delivery Scheduled: Yes, Expected medication delivery date: 09/23/22. Patient is aware of shipping delays.    Medication will be delivered via UPS to the prescription address in Epic WAM.     [name]   [facility] Specialty Technician

## 2022-09-22 MED FILL — DUPIXENT 300 MG/2 ML SUBCUTANEOUS PEN INJECTOR: SUBCUTANEOUS | 28 days supply | Qty: 4 | Fill #2

## 2022-10-10 ENCOUNTER — Ambulatory Visit
Admit: 2022-10-10 | Discharge: 2022-10-11 | Payer: MEDICARE | Attending: Student in an Organized Health Care Education/Training Program | Primary: Student in an Organized Health Care Education/Training Program

## 2022-10-10 DIAGNOSIS — L309 Dermatitis, unspecified: Principal | ICD-10-CM

## 2022-10-10 DIAGNOSIS — L281 Prurigo nodularis: Principal | ICD-10-CM

## 2022-10-10 DIAGNOSIS — Z85828 Personal history of other malignant neoplasm of skin: Principal | ICD-10-CM

## 2022-10-10 DIAGNOSIS — B079 Viral wart, unspecified: Principal | ICD-10-CM

## 2022-10-10 NOTE — Unmapped (Signed)
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 Difficulty/limitations of treatment options discussed. Risk of spread discussed.  - Joint decision to proceed with cryotherapy   Cryotherapy Procedure Note:   After R/B/A discussed (including blistering reaction, inflammatory reaction, scarring, pigment alteration, recurrence, or persistence of the lesion) and verbal consent was obtained, identified lesions were treated with liquid nitrogen x 2 ten second freeze-thaw cycle. The patient tolerated the procedure well and was instructed on post-procedure care. 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  - Discussed maintaining vigilance and counseled on sun protection as above.    Eczematous dermatitis with prurigo nodularis, excoriated lesions - Improving with Dupixent  - Continue fluocinonide (LIDEX) 0.05 % ointment; Apply topically Two (2) times a day. To itchy, raised lesions on trunk and extremities until improved.   - Continue dupilumab 300 mg subcutaneous injection every 14 days, started in April 2023. Has been improving with IGA score now at 1 compared to 4. Patient expressed that no refill(s) needed today.     The patient was advised to call for an appointment should any new, changing, or symptomatic lesions develop.     RTC: Return in about 3 months (around 01/09/2023) for Next scheduled follow up. or sooner as needed   _________________________________________________________________      Chief Complaint     Chief Complaint   Patient presents with    Follow-up     Better       HPI     Lindsay Webster is a 62 y.o. female who presents as a returning patient (last seen 09/05/2022) to [facility] for follow up of eczematous dermatitis.     Overall eczematous dermatitis/prurigo nodularis improving with Dupixent. Reports a few areas that have been slow to heal on the forearms and cheeks but otherwise pleased with her progress. She decided to stop niacinamide, felt it was contributing to her arthralgias.     Pertinent Past Medical History     Past Medical History, Family History, Social History, Medication List, Allergies, and Problem List were reviewed in the rooming section of Epic.     ROS: Other than symptoms mentioned in the HPI, no fevers, chills, or other skin complaints    Physical Examination     GENERAL: Well-appearing female in no acute distress, resting comfortably.  NEURO: Alert and oriented, answers questions appropriately  PSYCH: Normal mood and affect  RESP: No increased work of breathing  SKIN (Focused Exam): Per patient request, examination of the face, neck, scalp, bilateral upper and lower extremities, palms, and fingernails was performed  - Few scattered excoriated papules on the trunk and extremities, as well as on the bilateral cheeks  - Well-demarcated verrucous, hyperkeratotic papules on the palms>soles    All areas not commented on are within normal limits or unremarkable    (Approved Template 02/24/2020)

## 2022-10-10 NOTE — Unmapped (Addendum)
Eucerin Roughness Relief Spot treatment

## 2022-10-13 NOTE — Unmapped (Signed)
[facility] Refill Coordination Note    Specialty Medication(s) to be Shipped:   Inflammatory Disorders: Dupixent    Other medication(s) to be shipped: No additional medications requested for fill at this time     Lindsay Webster, DOB: 23-Apr-1961  Phone: [phone] (home)       All above HIPAA information was verified with patient.     Was a Nurse, learning disability used for this call? No    Completed refill call assessment today to schedule patient's medication shipment from the [facility] [phone]).  All relevant notes have been reviewed.     Specialty medication(s) and dose(s) confirmed: Regimen is correct and unchanged.   Changes to medications: [name] reports no changes at this time.  Changes to insurance: No  New side effects reported not previously addressed with a pharmacist or physician: None reported  Questions for the pharmacist: No    Confirmed patient received a Conservation officer, historic buildings and a Surveyor, mining with first shipment. The patient will receive a drug information handout for each medication shipped and additional FDA Medication Guides as required.       DISEASE/MEDICATION-SPECIFIC INFORMATION        For patients on injectable medications: Patient currently has 0 doses left.  Next injection is scheduled for 10/23/2022.    SPECIALTY MEDICATION ADHERENCE     Medication Adherence    Specialty Medication: dupilumab 300 mg/2 mL PnIj              Were doses missed due to medication being on hold? No        REFERRAL TO PHARMACIST     Referral to the pharmacist: Not needed      [facility]     Shipping address confirmed in Epic.     Patient was notified of new phone menu : No    Delivery Scheduled: Yes, Expected medication delivery date: 10/20/22.     Medication will be delivered via UPS to the prescription address in Epic WAM.     Lindsay Webster   [facility] Specialty Technician

## 2022-10-19 MED FILL — DUPIXENT 300 MG/2 ML SUBCUTANEOUS PEN INJECTOR: SUBCUTANEOUS | 28 days supply | Qty: 4 | Fill #3

## 2022-11-17 NOTE — Unmapped (Addendum)
[facility] Refill Coordination Note    Specialty Medication(s) to be Shipped:   DUPIXENT PEN 300 mg/2 mL Pnij (dupilumab)    Other medication(s) to be shipped: No additional medications requested for fill at this time     Lindsay Webster, DOB: 02/05/1961  Phone: [phone] (home)       All above HIPAA information was verified with patient.     Was a Nurse, learning disability used for this call? No    Completed refill call assessment today to schedule patient's medication shipment from the [facility] [phone]).  All relevant notes have been reviewed.     Specialty medication(s) and dose(s) confirmed: Regimen is correct and unchanged.   Changes to medications: Lindsay Webster reports no changes at this time.  Changes to insurance: No  New side effects reported not previously addressed with a pharmacist or physician: None reported  Questions for the pharmacist: No    Confirmed patient received a Conservation officer, historic buildings and a Surveyor, mining with first shipment. The patient will receive a drug information handout for each medication shipped and additional FDA Medication Guides as required.       DISEASE/MEDICATION-SPECIFIC INFORMATION        For patients on injectable medications: Patient currently has 0 doses left.  Next injection is scheduled for 11/20/22.    SPECIALTY MEDICATION ADHERENCE     Medication Adherence    Patient reported X missed doses in the last month: 0  Specialty Medication: dupilumab (Dupixent) 300 mg/2 mL PnIj  Patient is on additional specialty medications: No              Were doses missed due to medication being on hold? No     REPATHA SURECLICK 140 mg/mL Pnij (evolocumab): 0 days of medicine on hand        REFERRAL TO PHARMACIST     Referral to the pharmacist: Not needed      [facility]     Shipping address confirmed in Epic.       Delivery Scheduled: Yes, Expected medication delivery date: 11/22/22.     Medication will be delivered via UPS to the prescription address in Epic WAM.    Lindsay Webster   [facility] Specialty Technician

## 2022-11-21 ENCOUNTER — Ambulatory Visit
Admit: 2022-11-21 | Discharge: 2022-11-22 | Payer: MEDICARE | Attending: Student in an Organized Health Care Education/Training Program | Primary: Student in an Organized Health Care Education/Training Program

## 2022-11-21 MED ADMIN — triamcinolone acetonide (KENALOG) injection 10 mg: 10 mg | @ 19:00:00 | Stop: 2022-11-21

## 2022-11-21 MED FILL — DUPIXENT 300 MG/2 ML SUBCUTANEOUS PEN INJECTOR: SUBCUTANEOUS | 28 days supply | Qty: 4 | Fill #4

## 2022-11-21 NOTE — Unmapped (Signed)
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  - Discussed maintaining vigilance and counseled on sun protection as above.  - Can continue efudex daily for any scaly lesions of concern      Eczematous dermatitis with prurigo nodularis, excoriated lesions - Improving with Dupixent  - Continue fluocinonide (LIDEX) 0.05 % ointment; Apply topically Two (2) times a day. To itchy, raised lesions on trunk and extremities until improved.   - Continue dupilumab 300 mg subcutaneous injection every 14 days, started in April 2023. Has been improving with IGA score now at 1 compared to 4. Patient expressed that no refill(s) needed today.     Intralesional Kenalog of Prurigo L shin Procedure Note: After the patient was informed of risks (including atrophy and dyspigmentation), benefits and side effects of intralesional steroid injection, the patient elected to undergo injection and verbal consent was obtained. Skin was cleaned with alcohol and injected intralesionally into the sites (below). The patient tolerated the procedure well without complications and was instructed on post-procedure care.  Location(s): L shin   Number of sites treated: 1  Kenalog (triamcinolone) Concentration: 10 mg/ml   Volume: 1.0 ml total  - If no improvement, can consider bx and efudex intralesional at next visit    Actinic Keratosis(es):   - Discussed premalignant nature of lesions and therefore recommended therapy.   - Patient agrees to cryotherapy. See procedure note for details.  Cryotherapy Procedure Note:   After R/B/A discussed (including scarring, pigment alteration, recurrence, or persistence of the lesion) and verbal consent was obtained, identified lesions were treated with liquid nitrogen x 1 ten second freeze-thaw cycle. The patient tolerated the procedure well and was instructed on post-procedure care.  Total AK's frozen: 3  Location and number: face      Irritated/Inflamed Seborrheic Keratosis(es):   - Discussed benign nature of lesions, but due to symptoms, offered treatment with cryotherapy.  - Patient agrees to cryotherapy. See procedure note for details.  Cryotherapy Procedure Note:   After R/B/A discussed (including scarring, pigment alteration, recurrence, or persistence of the lesion) and verbal consent was obtained, identified lesions were treated with liquid nitrogen x 1-2 ten second freeze-thaw cycle. The patient tolerated the procedure well and was instructed on post-procedure care.  Total ISK(s) frozen: 2  Location and number: Chest and back     The patient was advised to call for an appointment should any new, changing, or symptomatic lesions develop.     RTC: Return in about 4 months (around 03/23/2023) for In-person. or sooner as needed   _________________________________________________________________      Chief Complaint     Chief Complaint   Patient presents with    Skin Check     FBSE, pt has areas of concern on left leg and chest areas       HPI     Lindsay Webster is a 62 y.o. female who presents as a returning patient (last seen 10/10/2022) to Dermatology for a full body skin exam. Patient last seen 09/20/22. She had verruca tx with cryo. Has hx of eruptive KA, prurigo - stable on dupixent.    Today reports:   - LOC on L shin. Has tried efudex on several spots as well.     The patient denies any  other new or changing lesions or areas of concern.     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     ROS: Other than symptoms mentioned in the HPI, no fevers, chills, or other skin complaints    Physical Examination     GENERAL: Well-appearing female in no acute distress, resting comfortably.  NEURO: Alert and oriented, answers questions appropriately  SKIN (Full Skin Exam): Examination of the face, eyelids, lips, nose, ears, neck, chest, abdomen, back, arms, legs, hands, feet, palms, soles, nails was performed  - Actinic Elastosis: moderate chronic sun damage: dyspigmentation, telangiectasia, and wrinkling  - Lentigo/lentigines: Scattered pigmented macules that are tan to brown in color and are somewhat non-uniform in shape and concentrated in the sun-exposed areas of the face, upper extremities, and scattered diffusely  - Nevus/nevi: Scattered well-demarcated, regular, pigmented macule(s) and/or papule(s) on the scattered diffusely  - Seborrheic Keratosis(es): Stuck-on appearing keratotic papule(s) on the scattered diffusely, none irritated with redness, crusting, edema, and/or partial avulsion  - L shin with tan plaque       (Approved Template 02/24/2020)

## 2022-12-21 NOTE — Unmapped (Signed)
[facility] Refill Coordination Note    Specialty Medication(s) to be Shipped:   Inflammatory Disorders: Dupixent    Other medication(s) to be shipped: No additional medications requested for fill at this time     Lindsay Webster, DOB: April 24, 1961  Phone: [phone] (home)       All above HIPAA information was verified with patient.     Was a Nurse, learning disability used for this call? No    Completed refill call assessment today to schedule patient's medication shipment from the [facility] [phone]).  All relevant notes have been reviewed.     Specialty medication(s) and dose(s) confirmed: Regimen is correct and unchanged.   Changes to medications: Tenecia reports no changes at this time.  Changes to insurance: No  New side effects reported not previously addressed with a pharmacist or physician: None reported  Questions for the pharmacist: No    Confirmed patient received a Conservation officer, historic buildings and a Surveyor, mining with first shipment. The patient will receive a drug information handout for each medication shipped and additional FDA Medication Guides as required.       DISEASE/MEDICATION-SPECIFIC INFORMATION        For patients on injectable medications: Patient currently has 0 doses left.  Next injection is scheduled for 12/23/2022.    SPECIALTY MEDICATION ADHERENCE     Medication Adherence    Specialty Medication: dupilumab 300 mg/2 mL PnIj              Were doses missed due to medication being on hold? No    Dupixent 300mg /34mL: 0 days of medicine on hand     REFERRAL TO PHARMACIST     Referral to the pharmacist: Not needed      [facility]     Shipping address confirmed in Epic.       Delivery Scheduled: Yes, Expected medication delivery date: 12/23/2022.     Medication will be delivered via UPS to the prescription address in Epic WAM.    Elnora Morrison, PharmD   [facility] Specialty Pharmacist

## 2022-12-22 MED FILL — DUPIXENT 300 MG/2 ML SUBCUTANEOUS PEN INJECTOR: SUBCUTANEOUS | 28 days supply | Qty: 4 | Fill #5

## 2023-01-09 ENCOUNTER — Ambulatory Visit: Admit: 2023-01-09 | Payer: MEDICARE

## 2023-01-26 NOTE — Unmapped (Signed)
[facility] Refill Coordination Note    Specialty Medication(s) to be Shipped:   Inflammatory Disorders: Dupixent    Other medication(s) to be shipped: No additional medications requested for fill at this time     Lindsay Webster, DOB: Dec 06, 1960  Phone: [phone] (home)       All above HIPAA information was verified with patient.     Was a Nurse, learning disability used for this call? No    Completed refill call assessment today to schedule patient's medication shipment from the [facility] [phone]).  All relevant notes have been reviewed.     Specialty medication(s) and dose(s) confirmed: Regimen is correct and unchanged.   Changes to medications: Lindsay Webster reports no changes at this time.  Changes to insurance: No  New side effects reported not previously addressed with a pharmacist or physician: None reported  Questions for the pharmacist: No    Confirmed patient received a Conservation officer, historic buildings and a Surveyor, mining with first shipment. The patient will receive a drug information handout for each medication shipped and additional FDA Medication Guides as required.       DISEASE/MEDICATION-SPECIFIC INFORMATION        For patients on injectable medications: Patient currently has 0 doses left.  Next injection is scheduled for overdue will take once received.    SPECIALTY MEDICATION ADHERENCE     Medication Adherence    Patient reported X missed doses in the last month: 0  Specialty Medication: dupilumab 300 mg/2 mL PnIj  Patient is on additional specialty medications: No              Were doses missed due to medication being on hold? No    Dupixent 300/2 mg/ml: 0 doses of medicine on hand        REFERRAL TO PHARMACIST     Referral to the pharmacist: Not needed      [facility]     Shipping address confirmed in Epic.       Delivery Scheduled: Yes, Expected medication delivery date: 01/31/23.     Medication will be delivered via UPS to the prescription address in Epic WAM.    [facility] Specialty Technician

## 2023-01-30 MED FILL — DUPIXENT 300 MG/2 ML SUBCUTANEOUS PEN INJECTOR: SUBCUTANEOUS | 28 days supply | Qty: 4 | Fill #6

## 2023-02-06 ENCOUNTER — Ambulatory Visit: Admit: 2023-02-06 | Discharge: 2023-02-07 | Payer: MEDICARE

## 2023-02-06 DIAGNOSIS — L281 Prurigo nodularis: Principal | ICD-10-CM

## 2023-02-06 DIAGNOSIS — Z85828 Personal history of other malignant neoplasm of skin: Principal | ICD-10-CM

## 2023-02-06 DIAGNOSIS — L282 Other prurigo: Principal | ICD-10-CM

## 2023-02-06 DIAGNOSIS — D485 Neoplasm of uncertain behavior of skin: Principal | ICD-10-CM

## 2023-02-06 DIAGNOSIS — R202 Paresthesia of skin: Principal | ICD-10-CM

## 2023-02-06 DIAGNOSIS — L820 Inflamed seborrheic keratosis: Principal | ICD-10-CM

## 2023-02-06 DIAGNOSIS — D492 Neoplasm of unspecified behavior of bone, soft tissue, and skin: Principal | ICD-10-CM

## 2023-02-06 DIAGNOSIS — L309 Dermatitis, unspecified: Principal | ICD-10-CM

## 2023-02-06 NOTE — Unmapped (Signed)
Dermatology Note     Assessment and Plan:      Benign Lesions/ Findings:   Actinic Elastosis  Seborrheic Keratosis(es) - no irritation noted  Seborrheic Keratosis(es) - irritation of some noted  - Reassurance provided regarding the benign appearance of lesions noted on exam today; no treatment is indicated in the absence of symptoms/changes.  - Reinforced importance of photoprotective strategies including liberal and frequent sunscreen use of a broad-spectrum SPF 30 or greater, use of protective clothing, and sun avoidance for prevention of cutaneous malignancy and photoaging.  Counseled patient on the importance of regular self-skin monitoring as well as routine clinical skin examinations as scheduled.     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  - Discussed maintaining vigilance and counseled on sun protection as above.  - Can continue efudex daily for any scaly lesions of concern     Eczematous dermatitis with prurigo nodularis, excoriated lesions - stable with Dupixent  - Continue fluocinonide (LIDEX) 0.05 % ointment; Apply topically Two (2) times a day. To itchy, raised lesions on trunk and extremities until improved. Patient expressed that no refill(s) needed today.   - Continue dupilumab 300 mg subcutaneous injection every 14 days, started in April 2023. Has been improving with IGA score now at 1 compared to 4. Patient expressed that no refill(s) needed today.      Neoplasm of uncertain behavior of skin (lesion of concern left shin)  - Dermatopathology Order  - To help confirm diagnosis, biopsy/biopsies obtained today.   Biopsy (Shave) Procedure Note:   After R/B/A discussed (including scarring, pigment alteration, recurrence, or persistence of the lesion) and consent was obtained, the area was marked and photographed. Time Out verification of patient, procedure, and site was performed. When applicable, safety precautions based on patient's medical history or medication use and review of relevant images and results was also performed as part of the Time Out. Site was then prepped with alcohol, and anesthetized with lidocaine 1% with epinephrine. Biopsy(ies) performed using a shave technique. Hemostasis was achieved with pressure, aluminum chloride, Monsel's, and/or electrocautery. Area was dressed with petrolatum and bandage. Wound care instructions were provided. We will contact the patient with results when available. Patient agrees to be notified of results by MyChart - even if needs further management.   A) Location: left shin, DDx: BCC vs SCC vs AK vs KA vs SK    Irritated/Inflamed Seborrheic Keratosis(es):   - Discussed benign nature of lesions, but due to symptoms, offered treatment with cryotherapy.  - Patient agrees to cryotherapy. See procedure note for details.  Cryotherapy Procedure Note:   After R/B/A discussed (including scarring, pigment alteration, recurrence, or persistence of the lesion) and verbal consent was obtained, identified lesions were treated with liquid nitrogen x 1-2 ten second freeze-thaw cycle. The patient tolerated the procedure well and was instructed on post-procedure care.  Total ISK(s) frozen: 1  Location and number: frontal hairline center    The patient was advised to call for an appointment should any new, changing, or symptomatic lesions develop.     RTC: Return in about 3 months (around 05/09/2023) for In-person. or sooner as needed   _________________________________________________________________      Chief Complaint     Chief Complaint   Patient presents with    Prurigo nodularis     Better  HPI     Lindsay Webster is a 62 y.o. female who presents as a returning patient (last seen 11/21/2022) to Dermatology for multiple lesions of concern.     Patient has history of eczematous dermatitis, prurigo nodularis and eruptive KAs, stable on dupilumab (Dupixent).    During the last visit: cryotherapy to 2 iSK on chest and back, cryotherapy to 3 AKs on face and Intralesional Kenalog to prurigo nodularis/KA lesion on left shin.    Today she reports LOC1 on forehead, itchy not going away. 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     The patient denies any other new or changing lesions or areas of concern.     Pertinent Past Medical History     History of skin cancer as outlined below:    Problem List       History of nonmelanoma skin cancer       Diagnosis Location Biopsy Date Treatment date Procedure Provider   KA Right forearm 05/23/22 07/25/22 Biopsy and topical 5-FU/calcipotriene T[facility]ett/[facility]   KA Right forearm 09/2021 09/2021, 10/2021 [place]-5FU [facility]    KA Left shin 08/2021 08/2021 IL-5FU [facility]   KA Right shin 05/2021 07/2020 IL-5FU [facility]   SCCis Left shin 05/2020 with Clear margins      SCCis Right hand 09/01/20 09/28/20 ED&C [name]             Arthritis   Fibromyalgia  Stroke  Myalgia  Migraine  Eczematous dermatitis and prurigo nodularis   Notalgia paresthetica  Eruptive Keratoacanthomas    Past Medical History, Family History, Social History, Medication List, Allergies, and Problem List were reviewed in the rooming section of Epic.     ROS: Other than symptoms mentioned in the HPI, no fevers, chills, or other skin complaints    Physical Examination     GENERAL: Well-appearing female in no acute distress, resting comfortably.  NEURO: Alert and oriented, answers questions appropriately  PSYCH: Normal mood and affect  SKIN (Focal Skin Exam): Per patient request, examination of face, arms and lower legs was performed  - Actinic Elastosis: moderate chronic sun damage: dyspigmentation, telangiectasia, and wrinkling  - Seborrheic Keratosis(es): Stuck-on appearing keratotic papule(s) on the frontal hairline, one irritated with redness, crusting, edema, and/or partial avulsion  - Actinic Purpura: Diffuse atrophy with scattered purpuric patches 1-3 cm in size on the bilateral forearms and dorsal hands. There is evidence of post-inflammatory hyperpigmentation with an orange-brown color change  - erythematous indurated papule with central scaling, located on left shin  (lesion of concern)          All areas not commented on are within normal limits or unremarkable    (Approved Template 02/24/2020)

## 2023-02-06 NOTE — Unmapped (Addendum)
[facility] releases most results to you as soon as they are available. Therefore, you may see some results before we do. Please give [place] 3 business days to review the tests and contact you by phone or through MyChart. If you are concerned that some results may be upsetting or confusing, you may wish to wait until we contact you before looking at the report in MyChart.   If you have an urgent question, you can call our clinic. MyChart should not be used for urgent issues. Otherwise, we prefer that you wait 3 business days for [place] to contact you.    The [facility] Dermatology Clinical Team     Shave biopsy   A shave biopsy involves numbing a small area of your skin and then obtaining a sample to help [place] with proper diagnosis or skin condition. Biopsy results typically return in 7 to 14 days.    To care for the area: Leave the bandage in place until the morning after your procedure is performed. On a daily basis, carefully remove the bandage, then shower or wash as usual. Allow water to run over the site. Please do not scrub. Carefully dry the area, then apply ointment (some people develop an allergy to Neosporin, so we recommend Vaseline orAquaphor). Cover the site with a fresh bandage. Should any bleeding occur, apply firm pressure for 15 minutes. The treated site will heal best if  a scab never forms (the wound heals by new skin cells traveling from the outside toward the middle-their journey is easier if no scab stands in their way).    Long-term care: the site will be more sensitive than your surrounding skin. Keep it covered, and remember to apply sunscreen every day to all your exposed skin. A scar may remain which is lighter or pinker than your normal skin. Your body will continue to improve your scar for up to one year.    Infection following this procedure is rare. However, if you are worried about the appearance of your site, contact your doctor. Complete healing may take up to one month. We have a physician on call at all times. If you have any concerns about the site, please call our clinic at [phone]     Cryosurgery  Cryosurgery (???freezing???) uses liquid nitrogen to destroy certain types of skin lesions. Lowering the temperature of the lesion in a small area surrounding skin destroys the lesion. Immediately following cryosurgery, you will notice redness and swelling of the treatment area. Blistering or weeping may occur, lasting approximately one week which will then be followed by crusting. Most areas will heal completely in 10 to 14 days.    Wash the treated areas daily. Allow soap and water to run over the areas, but do not scrub. Should a scab or crust form, allow it to fall off on its own. Do not remove or pick at it. Application of an ointment  and a bandage may make you feel more comfortable, but it is not necessary. Some people develop an allergy to Neosporin, so we recommend that Vaseline or  Aquaphor be used.    The cryotherapy site will be more sensitive than your surrounding skin. Keep it covered, and remember to apply sunscreen every day to all your sun exposed skin. A scar may remain which is lighter or pinker than your normal skin. Your body will continue to improve your scar for up to one year; however a light-colored scar may remain.    Infection following cryotherapy is rare.  However if you are worried about the appearance of the treated area, contact your doctor. We have a physician on call at all times. If you have any concerns about the site, please call our clinic at [phone]

## 2023-02-07 MED ORDER — FLUOROURACIL 5 % TOPICAL CREAM
0 refills | 0.00000 days | Status: CP
Start: 2023-02-07 — End: ?

## 2023-02-07 NOTE — Unmapped (Signed)
Refilled [facility], item [phone] : Fluorouracil 5% Calcipotriene 0.005% cream: Apply twice a day to early cancer lesions for 10 days. Sent 30g. Patient expressed understanding and agreement to out-of-pocket cost of prescription.

## 2023-02-07 NOTE — Unmapped (Signed)
Addended by: Ovidio Kin D on: 02/07/2023 04:35 PM     Modules accepted: Orders

## 2023-02-14 NOTE — Unmapped (Signed)
-----   Message from Ovidio Kin, MD sent at 02/07/2023  4:35 PM EDT -----  Result Note:    Specimen: A  Diagnosis:  Keratoacanthoma  Site: left shin  Tracking:Yes.  Treatment plan:  Intralesional 5-Fluorouracil in clinic and fluorouracil 5%/calcipotriene 0.005% cream while awaiting for in person appointment.  [id number] visit for Intralesional 5-Fluorouracil in [facility] continuity clinic with Dr Orma Flaming; soonest available  Patient notified: [facility]

## 2023-02-16 NOTE — Unmapped (Signed)
----- 

## 2023-02-21 NOTE — Unmapped (Signed)
[facility] Refill Coordination Note    Specialty Medication(s) to be Shipped:   Inflammatory Disorders: Dupixent    Other medication(s) to be shipped: No additional medications requested for fill at this time     Lindsay Webster, DOB: Dec 23, 1960  Phone: [phone] (home)       All above HIPAA information was verified with patient.     Was a Nurse, learning disability used for this call? No    Completed refill call assessment today to schedule patient's medication shipment from the [facility] [phone]).  All relevant notes have been reviewed.     Specialty medication(s) and dose(s) confirmed: Regimen is correct and unchanged.   Changes to medications: Lindsay Webster reports no changes at this time.  Changes to insurance: No  New side effects reported not previously addressed with a pharmacist or physician: None reported  Questions for the pharmacist: No    Confirmed patient received a Conservation officer, historic buildings and a Surveyor, mining with first shipment. The patient will receive a drug information handout for each medication shipped and additional FDA Medication Guides as required.       DISEASE/MEDICATION-SPECIFIC INFORMATION        For patients on injectable medications: Patient currently has 0 doses left.  Next injection is scheduled for 9/15.    SPECIALTY MEDICATION ADHERENCE     Medication Adherence    Patient reported X missed doses in the last month: 0  Specialty Medication: DUPIXENT PEN 300 mg/2 mL  Patient is on additional specialty medications: No              Were doses missed due to medication being on hold? No    Dupixent 300/2 mg/ml: 0 doses of medicine on hand        REFERRAL TO PHARMACIST     Referral to the pharmacist: Not needed      [facility]     Shipping address confirmed in Epic.       Delivery Scheduled: Yes, Expected medication delivery date: 02/23/23.     Medication will be delivered via UPS to the prescription address in Epic W[facility].    Willette Pa   [facility] Specialty Technician

## 2023-02-22 MED FILL — DUPIXENT 300 MG/2 ML SUBCUTANEOUS PEN INJECTOR: SUBCUTANEOUS | 28 days supply | Qty: 4 | Fill #7

## 2023-02-28 NOTE — Unmapped (Signed)
Left patient vmail requesting call back to schedule follow up with Dr. Carola Rhine and [name] in [facility].

## 2023-03-15 NOTE — Unmapped (Signed)
Lindsay Webster reports her Dupixent continues to work well. She continues to use artificial tears for dry eye (had this at baseline). She's got one tiny flare area and requests some steroid ointment to try.     [facility] Clinical Assessment & Refill Coordination Note    Lindsay Webster, DOB: 1961/03/09  Phone: [phone] (home)     All above HIPAA information was verified with patient.     Was a Nurse, learning disability used for this call? No    Specialty Medication(s):   Inflammatory Disorders: Dupixent     Current Outpatient Medications   Medication Sig Dispense Refill    albuterol HFA 90 mcg/actuation inhaler 1 puff as needed      ascorbic acid, vitamin C, (VITAMIN C) 1000 MG tablet Take 1 tablet (1,000 mg total) by mouth.      DENTA 5000 PLUS 1.1 % Crea USE DAILY ON TOOTHBRUSH AFTER REGULAR BRUSHING & FLOSSING DON TEAT OR DRINK FOR 30 MINUTES AFTER USE      doxycycline (VIBRA-TABS) 100 MG tablet       DULoxetine (CYMBALTA) 30 MG capsule Take 1 capsule (30 mg total) by mouth.      DULoxetine (CYMBALTA) 60 MG capsule Take 1 capsule (60 mg total) by mouth.      dupilumab 300 mg/2 mL PnIj Inject the contents of 1 pen (300 mg) under the skin every fourteen (14) days. 4 mL 11    dupilumab 300 mg/2 mL PnIj Inject the contents of 1 pen (300 mg total) under the skin every fourteen (14) days. 4 mL 11    empty container (SHARPS-A-GATOR DISPOSAL SYSTEM) Misc Use as directed 1 each 0    EPINEPHrine (EPIPEN) 0.3 mg/0.3 mL injection Inject 0.3 mL (0.3 mg total) into the muscle.      famotidine (PEPCID) 40 MG tablet       fluocinonide (LIDEX) 0.05 % ointment Apply topically two (2) times a day. To itchy, raised lesions on the legs until improved 60 g 2    fluorouracil (EFUDEX) 5 % cream Apply twice a day to pre-cancer/early cancer lesion on your left shin for 10 days. Stop if you develop open skin/sore. 40 g 0    fremanezumab-vfrm (AJOVY AUTOINJECTOR) 225 mg/1.5 mL AtIn Inject 225 mg under the skin every twenty-eight (28) days.      Lactobacillus rhamnosus GG (CULTURELLE) 10 billion cell capsule Take 1 capsule by mouth daily.      LORazepam (ATIVAN) 1 MG tablet Take 1 tablet (1 mg total) by mouth daily.  2    meclizine (ANTIVERT) 25 mg tablet Take 1 tablet (25 mg total) by mouth.      metoprolol tartrate (LOPRESSOR) 25 MG tablet TAKE 1 TABLET BY MOUTH TWICE A DAY      mupirocin (BACTROBAN) 2 % ointment Apply topically Three (3) times a day. To affected area till healed 15 g 1    MYRBETRIQ 50 mg Tb24 extended-release tablets Take 1 tablet (50 mg total) by mouth daily.  2    naloxegoL (MOVANTIK) tablet       omeprazole (PRILOSEC) 40 MG capsule TAKE 1 CAPSULE TWICE A DAY 30 MINUTES BEFORE A MEAL 60 capsule 2    polyethylene glycol (MIRALAX) 17 gram packet Take 17 g by mouth daily as needed.      TRIAMCINOLONE ACETONIDE (NASACORT NASL) 1 application. into each nostril two (2) times a day as needed.      vitamin E-268 mg, 400 UNIT, 268 mg (400  UNIT) capsule Take 1 capsule (268 mg total) by mouth.       No current facility-administered medications for this visit.        Changes to medications: Lindsay Webster reports no changes at this time.    Allergies   Allergen Reactions    Bee Pollens Swelling    Clopidogrel Bisulfate Other (See Comments) and Shortness Of Breath     QUESTIONABLE ASSOCIATION WITH SYMPTOMS, Over the course of a week it made her sore throat and difficulty breathing, believes throat was slowly swelling. Starting a z-pack today 11/17/17 to see if sore throat subsides.    Codeine Hives    Penicillins Hives    Sulfa (Sulfonamide Antibiotics) Anaphylaxis and Shortness Of Breath     Other reaction(s): Unknown    Venom-Honey Bee Swelling     Respiratory   Other reaction(s): Unknown    Zonisamide Anaphylaxis     Headache medication    Carisoprodol      Other reaction(s): rash    Penicillin      Other reaction(s): Unknown    Hydrocodone Nausea And Vomiting       Changes to allergies: No    SPECIALTY MEDICATION ADHERENCE     Dupixent - 0 left  Medication Adherence    Patient reported X missed doses in the last month: 0  Specialty Medication: Dupixent          Specialty medication(s) dose(s) confirmed: Regimen is correct and unchanged.     Are there any concerns with adherence? No    Adherence counseling provided? Not needed    CLINICAL MANAGEMENT AND INTERVENTION      Clinical Benefit Assessment:    Do you feel the medicine is effective or helping your condition? Yes    Clinical Benefit counseling provided? Not needed    Adverse Effects Assessment:    Are you experiencing any side effects? No    Are you experiencing difficulty administering your medicine? No    Quality of Life Assessment:    Quality of Life    Rheumatology  Oncology  Dermatology  1. What impact has your specialty medication had on the symptoms of your skin condition (i.e. itchiness, soreness, stinging)?: Tremendous  2. What impact has your specialty medication had on your comfort level with your skin?: Tremendous  Cystic Fibrosis          How many days over the past month did your AD  keep you from your normal activities? For example, brushing your teeth or getting up in the morning. 0    Have you discussed this with your provider? Not needed    Acute Infection Status:    Acute infections noted within Epic:  No active infections  Patient reported infection: None    Therapy Appropriateness:    Is therapy appropriate based on current medication list, adverse reactions, adherence, clinical benefit and progress toward achieving therapeutic goals? Yes, therapy is appropriate and should be continued     DISEASE/MEDICATION-SPECIFIC INFORMATION      For patients on injectable medications: Patient currently has 0 doses left.  Next injection is scheduled for 10/13.    Chronic Inflammatory Diseases: Have you experienced any flares in the last month? No  Has this been reported to your provider? No    PATIENT SPECIFIC NEEDS     Does the patient have any physical, cognitive, or cultural barriers? No    Is the patient high risk? No    Did the patient require a clinical  intervention? No    Does the patient require physician intervention or other additional services (i.e., nutrition, smoking cessation, social work)? No    SOCIAL DETERMINANTS OF HEALTH     At the [facility], we have learned that life circumstances - like trouble affording food, housing, utilities, or transportation can affect the health of many of our patients.   That is why we wanted to ask: are you currently experiencing any life circumstances that are negatively impacting your health and/or quality of life? Patient declined to answer    Social Determinants of Health     Food Insecurity: Not on file   Internet Connectivity: Not on file   Housing/Utilities: Not on file   Tobacco Use: High Risk (02/06/2023)    Patient History     Smoking Tobacco Use: Every Day     Smokeless Tobacco Use: Never     Passive Exposure: Not on file   Transportation Needs: Not on file   Alcohol Use: Not on file   Interpersonal Safety: Unknown (03/15/2023)    Interpersonal Safety     Unsafe Where You Currently Live: Not on file     Physically Hurt by Anyone: Not on file     Abused by Anyone: Not on file   Physical Activity: Not on file   Intimate Partner Violence: Unknown (09/17/2021)    Received from [facility]    HITS     Physically Hurt: Not on file     Insult or Talk Down To: Not on file     Threaten Physical Harm: Not on file     Scream or Curse: Not on file   Stress: Not on file   Substance Use: Not on file   Social Connections: Unknown (10/26/2021)    Received from [facility]    Social Network     Social Network: Not on file   Financial Resource Strain: Not on file   Depression: Not at risk (03/04/2021)    Received from [facility] visits prior to 08/13/2022., [facility] visits prior to 08/13/2022.    PHQ-2     SDOH PHQ2 SCORE: 0   Health Literacy: Not on file       Would you be willing to receive help with any of the needs that you have identified today? Not applicable       SHIPPING     Specialty Medication(s) to be Shipped:   Inflammatory Disorders: Dupixent    Other medication(s) to be shipped:  fluocinonide     Changes to insurance: No    Delivery Scheduled: Yes, Expected medication delivery date: 10/9.     Medication will be delivered via UPS to the confirmed prescription address in [facility].    The patient will receive a drug information handout for each medication shipped and additional FDA Medication Guides as required.  Verified that patient has previously received a Conservation officer, historic buildings and a Surveyor, mining.    The patient or caregiver noted above participated in the development of this care plan and knows that they can request review of or adjustments to the care plan at any time.      All of the patient's questions and concerns have been addressed.    Beatric [facility]op [facility] Specialty Pharmacist

## 2023-03-21 MED FILL — FLUOCINONIDE 0.05 % TOPICAL OINTMENT: TOPICAL | 30 days supply | Qty: 60 | Fill #0

## 2023-03-21 MED FILL — DUPIXENT 300 MG/2 ML SUBCUTANEOUS PEN INJECTOR: SUBCUTANEOUS | 28 days supply | Qty: 4 | Fill #8

## 2023-04-17 NOTE — Unmapped (Signed)
This encounter was created in error - please disregard.  No show to appointment

## 2023-04-18 NOTE — Unmapped (Signed)
The [facility] has made a second and final attempt to reach this patient to refill the following medication:Dupixent.      We have left voicemails on the following phone numbers: [phone], have been unable to leave messages on the following phone numbers: [phone], have sent a text message to the following phone numbers: [phone], and have sent a Mychart questionnaire..    Dates contacted: 04/12/2023   04/18/2023  Last scheduled delivery: 03/21/2023    The patient may be at risk of non-compliance with this medication. The patient should call the [facility] at [phone]  Option 4, then Option 2: Dermatology, Gastroenterology, Rheumatology to refill medication.    Orlean Patten

## 2023-04-19 NOTE — Unmapped (Signed)
[facility] Specialty and Home Delivery Pharmacy Refill Coordination Note    Specialty Medication(s) to be Shipped:   Inflammatory Disorders: Dupixent    Other medication(s) to be shipped: No additional medications requested for fill at this time     Lindsay Webster, DOB: 18-Nov-1960  Phone: [phone] (home)       All above HIPAA information was verified with patient.     Was a Nurse, learning disability used for this call? No    Completed refill call assessment today to schedule patient's medication shipment from the [facility]  [phone]).  All relevant notes have been reviewed.     Specialty medication(s) and dose(s) confirmed: Regimen is correct and unchanged.   Changes to medications: Lindsay Webster reports no changes at this time.  Changes to insurance: No  New side effects reported not previously addressed with a pharmacist or physician: None reported  Questions for the pharmacist: No    Confirmed patient received a Conservation officer, historic buildings and a Surveyor, mining with first shipment. The patient will receive a drug information handout for each medication shipped and additional FDA Medication Guides as required.       DISEASE/MEDICATION-SPECIFIC INFORMATION        For patients on injectable medications: Patient currently has 0 doses left.  Next injection is scheduled for 04/23/23.    SPECIALTY MEDICATION ADHERENCE     Medication Adherence    Patient reported X missed doses in the last month: 0  Specialty Medication: dupilumab 300 mg/2 mL PnIj  Patient is on additional specialty medications: No  Informant: patient              Were doses missed due to medication being on hold? No     DUPIXENT PEN 300 mg/2 mL Pnij (dupilumab): 0 days of medicine on hand       REFERRAL TO PHARMACIST     Referral to the pharmacist: Not needed      Lindsay Webster     Shipping address confirmed in Epic.       Delivery Scheduled: Yes, Expected medication delivery date: 04/25/2023.     Medication will be delivered via UPS to the prescription address in Epic WAM.    [name]   [facility]  Specialty Technician

## 2023-04-19 NOTE — Unmapped (Signed)
Nurse spoke with Clifton Custard, son, re: medication and instruction him to have pt contact [facility] to schedule delivery. Clifton Custard verbalized understanding.

## 2023-04-24 ENCOUNTER — Ambulatory Visit: Admit: 2023-04-24 | Discharge: 2023-04-25

## 2023-04-24 DIAGNOSIS — L858 Other specified epidermal thickening: Principal | ICD-10-CM

## 2023-04-24 DIAGNOSIS — L281 Prurigo nodularis: Principal | ICD-10-CM

## 2023-04-24 DIAGNOSIS — D485 Neoplasm of uncertain behavior of skin: Principal | ICD-10-CM

## 2023-04-24 DIAGNOSIS — L309 Dermatitis, unspecified: Principal | ICD-10-CM

## 2023-04-24 DIAGNOSIS — Z85828 Personal history of other malignant neoplasm of skin: Principal | ICD-10-CM

## 2023-04-24 MED FILL — DUPIXENT 300 MG/2 ML SUBCUTANEOUS PEN INJECTOR: SUBCUTANEOUS | 28 days supply | Qty: 4 | Fill #9

## 2023-04-24 NOTE — Unmapped (Signed)
Addended by: Inis Sizer A on: 04/24/2023 10:17 AM     Modules accepted: Level of Service

## 2023-04-24 NOTE — Unmapped (Addendum)
Dermatology Note     Assessment and Plan:      Personal history of non-melanoma skin cancer   - No evidence of recurrence at this time.  - Discussed maintaining vigilance and counseled on sun protection as above.    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 The patient made the informed decision to treat as orders below.  - patient agreed to treatment with shave removal/debulking and Intralesional 5-Fluorouracil     Shave Removal and Treatment Procedure Note  (1) Keratoacanthoma of left midline shin:     Risks, benefits and adverse effects of biopsy were reviewed.  After obtaining verbal consent, the biopsy site was anesthetized using 1% lidocaine with epinephrine.  Tissue site was obtained using a dermblade and hemostasis was achieved using cautery.      Immediately after the biopsy, the sites were treated using cautery and IL5FU as described below:    The base was injected with 5-Fluorouracil x 0.2 ml. Area was cleaned, dressed and post-procedure wound care instructions were reviewed.     Lot #: [id number]  Exp: 11/12/2023    Treatment Area (# of treatments by date) Tumor Size (mm)   (1) 04/24/23 Keratoacanthoma  15      Neoplasm(ia) of unspecified etiology:  - Dermatopathology Order  - To help confirm diagnosis, biopsy/biopsies obtained today.   Biopsy (Shave) Procedure Note:   After R/B/A discussed (including scarring, pigment alteration, recurrence, or persistence of the lesion) and consent was obtained, the area was marked and photographed. Time Out verification of patient, procedure, and site was performed. When applicable, safety precautions based on patient's medical history or medication use and review of relevant images and results was also performed as part of the Time Out. Site was then prepped with alcohol, and anesthetized with lidocaine 1% with epinephrine. Biopsy(ies) performed using a shave technique. Hemostasis was achieved with pressure, aluminum chloride, Monsel's, and/or electrocautery. Area was dressed with petrolatum and bandage. Wound care instructions were provided. We will contact the patient with results when available. Patient agrees to be notified of results by MyChart - even if needs further management.   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  - Discussed maintaining vigilance and counseled on sun protection as above.  - Can continue efudex daily for any scaly lesions of concern      Eczematous dermatitis with prurigo nodularis, excoriated lesions - stable with dupilumab (Dupixent) with eczema flare of right upper arm today  - Continue fluocinonide (LIDEX) 0.05 % ointment; Apply topically Two (2) times a day. To itchy, raised lesions on trunk and extremities until improved. Patient expressed that no refill(s) needed today.   - Continue dupilumab 300 mg subcutaneous injection every 14 days, started in April 2023. Has been improving with IGA score now at 1 compared to 4. Patient expressed that no refill(s) needed today.   - recommend applying topical steroid to flare of eczema on  right upper arm; recommend not mixing topical steroid ointment with vaseline.    Benign Lesions/ Findings:   Actinic Purpura  - Reassurance provided regarding the benign appearance of lesions noted on exam today; no treatment is indicated in the absence of symptoms/changes.  - Reinforced importance of photoprotective strategies including liberal and frequent sunscreen use of a broad-spectrum SPF 30 or greater, use of protective clothing, and sun avoidance for prevention of cutaneous malignancy and photoaging.  Counseled patient on the importance of regular self-skin monitoring as well as routine clinical skin examinations as scheduled.   - discussed considering using over-the-counter arnica cream/lotion for actinic purpura.    The patient was advised to call for an appointment should any new, changing, or symptomatic lesions develop.     RTC: Return in about 6 months (around 10/22/2023) for In-person. or sooner as needed   _________________________________________________________________      Chief Complaint     Chief Complaint   Patient presents with    Follow-up     Patient states that things are worst. Patient states that the area where she was biopsied is still open and larger.       HPI     Lindsay Webster is a 62 y.o. female who presents as a returning patient (last seen No previous visit found) to Dermatology for follow up of KA on left shin and new lesion of concern.     She completed two 14 day courses of the fluorouracil 5%/calcipotriene 0.005% cream on the KA of the left shin biopsied during the last visit and ti has not gone away.    Skin Lesion of Concern  Location: left medial shin   Duration: weeks  Character: not going away  Pain or tendernes: No  Bleeding: No  Pruritus: No  Treatment(s): none    The patient denies any other new or changing lesions or areas of concern.     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     ROS: Other than symptoms mentioned in the HPI, no fevers, chills, or other skin complaints    Physical Examination     GENERAL: Well-appearing female in no acute distress, resting comfortably.  NEURO: Alert and oriented, answers questions appropriately  PSYCH: Normal mood and affect  SKIN (Focal Skin Exam): Per patient request, examination of right upper medial arm, left shin  was performed    - erythematous indurated crateriform plaque with central erosion located on left midlien shin (Keratoacanthoma lesion A)  - KA  vs SCC vs prurigo nodularis vs excoriation vs other (biopsy lesion B)  - a thin eczematous plaque on the right upper arm medially  - Diffuse atrophy with scattered purpuric patches 1-3 cm in size on the left lower leg.  There is evidence of post-inflammatory hyperpigmentation with an orange-brown color change        All areas not commented on are within normal limits or unremarkable    (Approved Template 02/24/2020)

## 2023-04-24 NOTE — Unmapped (Signed)
[facility] releases most results to you as soon as they are available. Therefore, you may see some results before we do. Please give [place] 3 business days to review the tests and contact you by phone or through MyChart. If you are concerned that some results may be upsetting or confusing, you may wish to wait until we contact you before looking at the report in MyChart.   If you have an urgent question, you can call our clinic. MyChart should not be used for urgent issues. Otherwise, we prefer that you wait 3 business days for [place] to contact you.    [facility] Dermatology Clinical Team     Shave biopsy   A shave biopsy involves numbing a small area of your skin and then obtaining a sample to help [place] with proper diagnosis or skin condition. Biopsy results typically return in 7 to 14 days.    To care for the area: Leave the bandage in place until the morning after your procedure is performed. On a daily basis, carefully remove the bandage, then shower or wash as usual. Allow water to run over the site. Please do not scrub. Carefully dry the area, then apply ointment (some people develop an allergy to Neosporin, so we recommend Vaseline orAquaphor). Cover the site with a fresh bandage. Should any bleeding occur, apply firm pressure for 15 minutes. The treated site will heal best if  a scab never forms (the wound heals by new skin cells traveling from the outside toward the middle-their journey is easier if no scab stands in their way).    Long-term care: the site will be more sensitive than your surrounding skin. Keep it covered, and remember to apply sunscreen every day to all your exposed skin. A scar may remain which is lighter or pinker than your normal skin. Your body will continue to improve your scar for up to one year.    Infection following this procedure is rare. However, if you are worried about the appearance of your site, contact your doctor. Complete healing may take up to one month. We have a physician on call at all times. If you have any concerns about the site, please call our clinic at [phone]       Chemo (5 Fluorouracil injections):  Rarely patients may experience some mild nausea after the injections - to prevent this, try consuming ginger ale and saltine crackers throughout the day today in addition to your normal meals.  You may experience some mild pain at the injection sites for which you can take tylenol/ibuprofen as needed.

## 2023-05-23 DIAGNOSIS — L281 Prurigo nodularis: Principal | ICD-10-CM

## 2023-05-23 DIAGNOSIS — L309 Dermatitis, unspecified: Principal | ICD-10-CM

## 2023-05-23 DIAGNOSIS — L858 Other specified epidermal thickening: Principal | ICD-10-CM

## 2023-05-23 MED ORDER — DUPIXENT 300 MG/2 ML SUBCUTANEOUS PEN INJECTOR
SUBCUTANEOUS | 11 refills | 28.00 days | Status: CP
Start: 2023-05-23 — End: ?
  Filled 2023-06-19: qty 4, 28d supply, fill #0

## 2023-05-23 MED ORDER — EMPTY CONTAINER
0 refills | 0.00 days | Status: CP
Start: 2023-05-23 — End: ?

## 2023-05-23 MED ORDER — MUPIROCIN 2 % TOPICAL OINTMENT
Freq: Three times a day (TID) | TOPICAL | 1 refills | 0.00 days | Status: CP
Start: 2023-05-23 — End: ?
  Filled 2023-06-19: qty 22, 22d supply, fill #0

## 2023-05-23 MED ORDER — FLUOROURACIL 5 % TOPICAL CREAM
0 refills | 0.00 days | Status: CP
Start: 2023-05-23 — End: ?
  Filled 2023-07-24: qty 40, 20d supply, fill #0

## 2023-05-24 DIAGNOSIS — L858 Other specified epidermal thickening: Principal | ICD-10-CM

## 2023-06-13 NOTE — Unmapped (Signed)
[facility] Specialty and Home Delivery Pharmacy Refill Coordination Note    Specialty Medication(s) to be Shipped:   Inflammatory Disorders: Dupixent    Other medication(s) to be shipped:  mupirocin 2% ointment     Lindsay Webster, DOB: Jul 08, 1960  Phone: [phone] (home)       All above HIPAA information was verified with patient.     Was a Nurse, learning disability used for this call? No    Completed refill call assessment today to schedule patient's medication shipment from the [facility] and [facility] Delivery Pharm[facility]  [phone]).  All relevant notes have been reviewed.     Specialty medication(s) and dose(s) confirmed: Regimen is correct and unchanged.   Changes to medications: Lindsay Webster reports no changes at this time.  Changes to insurance: No  New side effects reported not previously addressed with a pharmacist or physician: None reported  Questions for the pharmacist: No    Confirmed patient received a Conservation officer, historic buildings and a Surveyor, mining with first shipment. The patient will receive a drug information handout for each medication shipped and additional FDA Medication Guides as required.       DISEASE/MEDICATION-SPECIFIC INFORMATION        For patients on injectable medications: Patient currently has 0 doses left.  Next injection is scheduled for asap. Patient was ill and on bedrest and has not been able to call to schedule delivery. Will take missed dose upon delivery.    SPECIALTY MEDICATION ADHERENCE     Medication Adherence    Patient reported X missed doses in the last month: 1  Specialty Medication: dupilumab (DUPIXENT PEN) 300 mg/2 mL PnIj  Patient is on additional specialty medications: No  Informant: patient              Were doses missed due to medication being on hold? No     DUPIXENT PEN 300 mg/2 mL Pnij (dupilumab): 0 days of medicine on hand       REFERRAL TO PHARMACIST     Referral to the pharmacist: Yes - routine compliance concerns. Patient has missed 1-3 doses of medication. Refills were scheduled and concern routed to pharmacist for evaluation.      SHIPPING     Shipping address confirmed in Epic.       Delivery Scheduled: Yes, Expected medication delivery date: 06/16/2023.     Medication will be delivered via UPS to the prescription address in Epic WAM.    Lindsay Webster Specialty and [facility]

## 2023-06-17 NOTE — Unmapped (Signed)
Montine Circle 's Dupixent shipment will be sent out as a result of copay is now approved by patient/caregiver.      I have spoken with the patient  at [phone]  and communicated the delivery change. We will reschedule the medication for the delivery date that the patient agreed upon.  We have confirmed the delivery date as 06/20/23, via ups.

## 2023-07-24 MED FILL — EMPTY CONTAINER: 90 days supply | Qty: 1 | Fill #0

## 2023-07-24 NOTE — Unmapped (Signed)
[facility] Specialty and Home Delivery Pharmacy Refill Coordination Note    Lindsay Webster, DOB: 08/11/1960  Phone: [phone] (home)       All above HIPAA information was verified with patient.         07/24/2023     1:12 PM   Specialty Rx Medication Refill Questionnaire   Which Medications would you like refilled and shipped? Dupixant   Please list all current allergies: None   Have you missed any doses in the last 30 days? No   Have you had any changes to your medication(s) since your last refill? No   How many days remaining of each medication do you have at home? None.  Its past due now   If receiving an injectable medication, next injection date is 07/23/2023   Have you experienced any side effects in the last 30 days? No   Please enter the full address (street address, city, state, zip code) where you would like your medication(s) to be delivered to. [address], [place], [place] [address]   Please specify on which day you would like your medication(s) to arrive. Note: if you need your medication(s) within 3 days, please call the pharmacy to schedule your order at [phone]  07/25/2023   Has your insurance changed since your last refill? No   Would you like a pharmacist to call you to discuss your medication(s)? No   Do you require a signature for your package? (Note: if we are billing Medicare Part B or your order contains a controlled substance, we will require a signature) No   Additional Comments: None         Completed refill call assessment today to schedule patient's medication shipment from the [facility] Specialty and Home Delivery Pharmacy [phone]).  All relevant notes have been reviewed.       Confirmed patient received a Conservation officer, historic buildings and a Surveyor, mining with first shipment. The patient will receive a drug information handout for each medication shipped and additional FDA Medication Guides as required.         REFERRAL TO PHARMACIST     Referral to the pharmacist: Not needed      [facility]     Shipping address confirmed in Epic.     Delivery Scheduled: Yes, Expected medication delivery date: 2/12.     Medication will be delivered via UPS to the prescription address in Epic WAM.    Lindsay Webster Specialty and [facility]

## 2023-07-25 MED FILL — DUPIXENT 300 MG/2 ML SUBCUTANEOUS PEN INJECTOR: SUBCUTANEOUS | 28 days supply | Qty: 4 | Fill #1

## 2023-08-11 NOTE — Unmapped (Signed)
 [facility] Specialty and Home Delivery Pharmacy Refill Coordination Note    Specialty Medication(s) to be Shipped:   Inflammatory Disorders: Dupixent    Other medication(s) to be shipped: No additional medications requested for fill at this time     Lindsay Webster, DOB: 1961/04/06  Phone: [phone] (home)       All above HIPAA information was verified with patient.     Was a Nurse, learning disability used for this call? No    Completed refill call assessment today to schedule patient's medication shipment from the [facility] and [facility] Delivery Pharmacy  [phone]).  All relevant notes have been reviewed.     Specialty medication(s) and dose(s) confirmed: Regimen is correct and unchanged.   Changes to medications: Lindsay Webster reports no changes at this time.  Changes to insurance: No  New side effects reported not previously addressed with a pharmacist or physician: None reported  Questions for the pharmacist: No    Confirmed patient received a Conservation officer, historic buildings and a Surveyor, mining with first shipment. The patient will receive a drug information handout for each medication shipped and additional FDA Medication Guides as required.       DISEASE/MEDICATION-SPECIFIC INFORMATION        For patients on injectable medications: Patient currently has 0 doses left.  Next injection is scheduled for 3/12.    SPECIALTY MEDICATION ADHERENCE     Medication Adherence    Patient reported X missed doses in the last month: 0  Specialty Medication: dupilumab (DUPIXENT PEN) 300 mg/2 mL PnIj  Patient is on additional specialty medications: No  Informant: patient              Were doses missed due to medication being on hold? No     DUPIXENT PEN 300 mg/2 mL Pnij (dupilumab): 0 days of medicine on hand       REFERRAL TO PHARMACIST     Referral to the pharmacist: Not needed      [facility]     Shipping address confirmed in Epic.       Delivery Scheduled: Yes, Expected medication delivery date: 3/7.     Medication will be delivered via UPS to the prescription address in Epic WAM.    Lindsay Webster Specialty and [facility]

## 2023-08-17 MED FILL — DUPIXENT 300 MG/2 ML SUBCUTANEOUS PEN INJECTOR: SUBCUTANEOUS | 28 days supply | Qty: 4 | Fill #2

## 2023-09-18 NOTE — Unmapped (Unsigned)
 [facility] Specialty and Home Delivery Pharmacy Refill Coordination Note    Specialty Lite Medication(s) to be Shipped:   Repatha    Other medication(s) to be shipped: No additional medications requested for fill at this time     Lindsay Webster, DOB: Jun 30, 1960  Phone: [phone] (home)       All above HIPAA information was verified with patient.     Was a Nurse, learning disability used for this call? No    Changes to medications: Lindsay Webster reports no changes at this time.  Changes to insurance: No      REFERRAL TO PHARMACIST     Referral to the pharmacist: Not needed      [facility]     Shipping address confirmed in Epic.     Cost and Payment: Patient has a copay of $***. They are aware and have authorized the pharmacy to charge the credit card on file.    Delivery Scheduled: {Blank:19197::Yes, Expected medication delivery date: ***.,Yes, Expected medication delivery date: ***.  However, Rx request for refills was sent to the provider as there are none remaining.,Patient declined refill at this time due to ***.,No, cannot schedule delivery at this time as there are outstanding items that need addressed.  This note has been handed off to the provider for follow up.,Due to patient insurance changes, unable to fill at [facility], please route Rx to *** specialty pharmacy}     Medication will be delivered via {Blank:19197::UPS,Next Day Courier,Same Day Courier,Clinic Courier - *** clinic,***} to the {Blank:19197::prescription,temporary} address in Epic WAM.    Lindsay Webster [facility] and [facility] Delivery Pharmacy Specialty {Blank:19197::Pharmacist,Technician}

## 2023-10-10 MED FILL — DUPIXENT 300 MG/2 ML SUBCUTANEOUS PEN INJECTOR: SUBCUTANEOUS | 28 days supply | Qty: 4 | Fill #3

## 2023-10-10 NOTE — Unmapped (Signed)
 [facility] Specialty and Home Delivery Pharmacy Refill Coordination Note    Lindsay Webster, DOB: 1961/04/03  Phone: [phone] (home)       All above HIPAA information was verified with patient.         10/10/2023    10:37 AM   Specialty Rx Medication Refill Questionnaire   Which Medications would you like refilled and shipped? Dupixant:  zero on hand!!  Last shot taken was Sunday September 17, 2023, i have not received nor have any to take!  I don???t understand how this can continue to help me when I receive it so randomly that its never taken on the 14 day schedule it ask for!   Please list all current allergies: Have not changed   Have you missed any doses in the last 30 days? Yes   If Yes, how many doses have you missed ? 0-2   Have you had any changes to your medication(s) since your last refill? No   How many days remaining of each medication do you have at home? Zero   If receiving an injectable medication, next injection date is 10/01/2023   Have you experienced any side effects in the last 30 days? No   Please enter the full address (street address, city, state, zip code) where you would like your medication(s) to be delivered to. Same as always.  This is my home I own it , [address], [place], [place] [address]   Please specify on which day you would like your medication(s) to arrive. Note: if you need your medication(s) within 3 days, please call the pharmacy to schedule your order at [phone]  10/11/2023   Has your insurance changed since your last refill? No   Would you like a pharmacist to call you to discuss your medication(s)? No   Do you require a signature for your package? (Note: if we are billing Medicare Part B or your order contains a controlled substance, we will require a signature) No   I have been provided my out of pocket cost for my medication and approve the pharmacy to charge the amount to my credit card on file. Yes   Additional Comments: Is this the only way to get this medicine??  I don???t believe 1 time a month or a month in between a box of 2 is beneficial or the intention for me take this drug! There???s been 2 months so far that had 5 weeks which throws of the once a month questions causing me to miss weeks in between!  Why cant it just be sent every 2-3 weeks regardless of all else!!         Completed refill call assessment today to schedule patient's medication shipment from the [facility] Special[facility] and Home Delivery Pharmacy [phone]).  All relevant notes have been reviewed.       Confirmed patient received a Conservation officer, historic buildings and a Surveyor, mining with first shipment. The patient will receive a drug information handout for each medication shipped and additional FDA Medication Guides as required.         REFERRAL TO PHARMACIST     Referral to the pharmacist: Yes - routine compliance concerns. Patient has missed 1-3 doses of medication. Refills were scheduled and concern routed to pharmacist for evaluation.      SHIPPING     Shipping address confirmed in Epic.     Delivery Scheduled: Yes, Expected medication delivery date: 10/11/2023.     Medication will be delivered  via UPS to the prescription address in Epic WAM.    Donney Gala   [facility] Specialty and Home Delivery Pharmacy Specialty Technician

## 2023-11-07 NOTE — Unmapped (Addendum)
 This pharmacist was notified by a technician that this patient has reported that they've missed 0-2  doses of their Dupixent.. I have reviewed the patient's medical record and have determined that no further pharmacist action is needed.      Approximate time spent: 0-5 minutes    Lindsay Webster, Clinical Specialty Pharmacist  [facility] Specialty and Home Delivery Pharmacy      [facility] Specialty and Home Delivery Pharmacy Refill Coordination Note    Lindsay Webster, DOB: [birth year]  Phone: [phone] (home)       All above HIPAA information was verified with patient.         11/06/2023     5:13 PM   Specialty Rx Medication Refill Questionnaire   Which Medications would you like refilled and shipped? Dupixant   Please list all current allergies: Pencillin   Have you missed any doses in the last 30 days? Yes   If Yes, how many doses have you missed ? 0-2   Have you had any changes to your medication(s) since your last refill? No   How many days remaining of each medication do you have at home? 0   If receiving an injectable medication, next injection date is 10/29/2023   Have you experienced any side effects in the last 30 days? No   Please enter the full address (street address, city, state, zip code) where you would like your medication(s) to be delivered to. [address], [place], [place] [address]   Please specify on which day you would like your medication(s) to arrive. Note: if you need your medication(s) within 3 days, please call the pharmacy to schedule your order at [phone]  11/09/2023   Has your insurance changed since your last refill? No   Would you like a pharmacist to call you to discuss your medication(s)? No   Do you require a signature for your package? (Note: if we are billing Medicare Part B or your order contains a controlled substance, we will require a signature) No   I have been provided my out of pocket cost for my medication and approve the pharmacy to charge the amount to my credit card on file. Yes         Completed refill call assessment today to schedule patient's medication shipment from the [facility] and Home Delivery Pharmacy [phone]).  All relevant notes have been reviewed.       Confirmed patient received a Conservation officer, historic buildings and a Surveyor, mining with first shipment. The patient will receive a drug information handout for each medication shipped and additional FDA Medication Guides as required.         REFERRAL TO PHARMACIST     Referral to the pharmacist: Yes - routine compliance concerns. Patient has missed 1-3 doses of medication. Refills were scheduled and concern routed to pharmacist for evaluation.      SHIPPING     Shipping address confirmed in Epic.     Delivery Scheduled: Yes, Expected medication delivery date: 5/29.     Medication will be delivered via UPS to the prescription address in Epic WAM.    Lindsay Webster Specialty and [facility]

## 2023-11-08 MED FILL — DUPIXENT 300 MG/2 ML SUBCUTANEOUS PEN INJECTOR: SUBCUTANEOUS | 28 days supply | Qty: 4 | Fill #4

## 2023-11-24 NOTE — Unmapped (Unsigned)
 This encounter was created in error - please disregard. Appointment cancelled. should any new, changing, or symptomatic lesions develop.     RTC: No follow-ups on file. or sooner as needed   _________________________________________________________________      Chief Complaint     No chief complaint on file.      HPI     Lindsay Webster is a 63 y.o. 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 ***    History of Present Illness        The patient denies any other new or changing lesions or areas of concern.     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     ROS: Other than symptoms mentioned in the HPI, no fevers, chills, or other skin complaints    Physical Examination     GENERAL: Well-appearing female in no acute distress, resting comfortably.  NEURO: Alert and oriented, answers questions appropriately  PSYCH: Normal mood and affect  {PE extent:75514}  {PE list:75421}  ***  Physical Exam      All areas not commented on are within normal limits or unremarkable      (Approved Template 07/15/2023)

## 2023-11-27 NOTE — Unmapped (Signed)
 [facility] releases most results to you as soon as they are available. Therefore, you may see some results before we do. Please give [place] 3 business days to review the tests and contact you by phone or through MyChart. If you are concerned that some results may be upsetting or confusing, you may wish to wait until we contact you before looking at the report in MyChart.   If you have an urgent question, you can call our clinic. MyChart should not be used for urgent issues. Otherwise, we prefer that you wait 3 business days for [place] to contact you.    [facility] Dermatology Clinical Team

## 2023-12-06 NOTE — Unmapped (Signed)
 [facility] Specialty and Home Delivery Pharmacy Refill Coordination Note    Maurisa Tesmer, DOB: November 20, 1960  Phone: [phone] (home)       All above HIPAA information was verified with patient.         11/29/2023     7:36 PM   Specialty Rx Medication Refill Questionnaire   Which Medications would you like refilled and shipped? Dupexent   Please list all current allergies: Same on file   Have you missed any doses in the last 30 days? No   Have you had any changes to your medication(s) since your last refill? No   How many days remaining of each medication do you have at home? 0   If receiving an injectable medication, next injection date is 12/03/2023   Have you experienced any side effects in the last 30 days? No   Please enter the full address (street address, city, state, zip code) where you would like your medication(s) to be delivered to. [address], [place], [place] [address]   Please specify on which day you would like your medication(s) to arrive. Note: if you need your medication(s) within 3 days, please call the pharmacy to schedule your order at [phone]  12/04/2023   Has your insurance changed since your last refill? No   Would you like a pharmacist to call you to discuss your medication(s)? No   Do you require a signature for your package? (Note: if we are billing Medicare Part B or your order contains a controlled substance, we will require a signature) No   I have been provided my out of pocket cost for my medication and approve the pharmacy to charge the amount to my credit card on file. Yes   Additional Comments: Thank you for doing this earlier in the month!         Completed refill call assessment today to schedule patient's medication shipment from the [facility] and Home Delivery Pharmacy [phone]).  All relevant notes have been reviewed.       Confirmed patient received a Conservation officer, historic buildings and a Surveyor, mining with first shipment. The patient will receive a drug information handout for each medication shipped and additional FDA Medication Guides as required.         REFERRAL TO PHARMACIST     Referral to the pharmacist: Not needed      [facility]     Shipping address confirmed in Epic.     Delivery Scheduled: Yes, Expected medication delivery date: 6/27.     Medication will be delivered via UPS to the prescription address in Epic WAM.    Jeoffrey JAYSON Sherra [facility] Specialty and [facility]

## 2023-12-07 MED FILL — DUPIXENT 300 MG/2 ML SUBCUTANEOUS PEN INJECTOR: SUBCUTANEOUS | 28 days supply | Qty: 4 | Fill #5

## 2023-12-24 NOTE — Unmapped (Signed)
 Dermatology Note     Assessment and Plan:      Benign Lesions/ Findings:   Lentigo/Lentigines  Nevus/Nevi-Benign Appearing  Actinic Purpura  - Reassurance provided regarding the benign appearance of lesions noted on exam today; no treatment is indicated in the absence of symptoms/changes.  - Reinforced importance of photoprotective strategies including liberal and frequent sunscreen use of a broad-spectrum SPF 30 or greater, use of protective clothing, and sun avoidance for prevention of cutaneous malignancy and photoaging.  Counseled patient on the importance of regular self-skin monitoring as well as routine clinical skin examinations as scheduled.     Actinic keratoses, nose x 2  - After verbal informed consent discussing the risks and benefits of the procedure including scarring, pigment alteration, recurrence, or persistence of the lesion, cryotherapy using liquid nitrogen was performed. Number treated: 2  - Continue compounded 5FU cream; refills provided through [facility]    Personal history of non-melanoma skin cancer   - No evidence of recurrence at this time.  - Discussed maintaining vigilance and counseled on sun protection as above.        The patient was advised to call for an appointment should any new, changing, or symptomatic lesions develop.     RTC: Return in about 5 months (around 05/26/2024). or sooner as needed   _________________________________________________________________      Chief Complaint     Chief Complaint   Patient presents with    Skin Check     Pt coming in for 6 month FBSE.  Spot on left lower legs   Spot on right hand.         HPI     Lindsay Webster is a 63 y.o. female who presents as a returning patient (last seen 04/24/2023) to Dermatology for follow up of multiple KAs and prurigo nodules.    Today she reports:  - Doing well from a cutaneous point of view with no new lesions  - She has used her compounded 5FU on her face, chest and arms/legs (the latter on previously biopsied sites) and tolerated well  - Medical history most recently complicated by kidney stones     The patient denies any other new or changing lesions or areas of concern.     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     ROS: Other than symptoms mentioned in the HPI, no fevers, chills, or other skin complaints    Physical Examination     GENERAL: Well-appearing female in no acute distress, resting comfortably.  NEURO: Alert and oriented, answers questions appropriately  PSYCH: Normal mood and affect  SKIN (Full Skin Exam):  Examination of the face, eyelids, lips, nose, ears, neck, chest, abdomen, back, arms, legs, hands, feet, palms, soles, nails was performed  - Actinic Keratosis(es): Scaly erythematous macule(s) on the the nasal tip  - Lentigo/lentigines: Scattered pigmented macules that are tan to brown in color and are somewhat non-uniform in shape and concentrated in the sun-exposed areas of the face and upper extremities  - Nevus/nevi: Scattered well-demarcated, regular, pigmented macule(s) and/or papule(s) on the scattered diffusely  - Seborrheic Keratosis(es): Stuck-on appearing keratotic papule(s) on the scattered diffusely, none irritated with redness, crusting, edema, and/or partial avulsion  - Surgical Site(s): Well healed scars without nodularity, re-pigmentation, scaling, erythema, or regrowth  - Actinic Purpura: Diffuse atrophy with scattered purpuric patches 1-3 cm in size on the bilateral forearms and dorsal hands. There is evidence of post-inflammatory hyperpigmentation with an orange-brown color change      All areas not commented on are within normal limits or unremarkable      (Approved Template 02/24/2020)

## 2023-12-25 DIAGNOSIS — L858 Other specified epidermal thickening: Principal | ICD-10-CM

## 2023-12-25 DIAGNOSIS — L570 Actinic keratosis: Principal | ICD-10-CM

## 2023-12-25 MED ORDER — FLUOROURACIL 5 % TOPICAL CREAM
TOPICAL | 11 refills | 0.00000 days | Status: CP
Start: 2023-12-25 — End: ?

## 2023-12-25 NOTE — Unmapped (Addendum)
 Urea or Amlactin!    Meet your team:     Your intake nurse is: Bruno     Please remember to fill out the survey you will receive after your visit. Your comments help us  continue to improve our care.      Thanks in advance!      [facility] Dermatology Clinical Staff       Spots on hands and wrist area are called Purpura.    Patient Education        Skin Cancer Prevention: Care Instructions  Your Care Instructions     Skin cancer is the abnormal growth of cells in the skin. It usually appears as a growth that changes in color, shape, or size. This can be a sore that does not heal or a change in a mole. Skin cancer is almost always curable when found early and treated. So it is important to see your doctor if you have any of these changes in your skin.  Skin cancer is the most common type of cancer. It often appears on areas of the body that have been exposed to the sun, such as the head, face, neck, back, chest, or shoulders.  Follow-up care is a key part of your treatment and safety. Be sure to make and go to all appointments, and call your doctor if you are having problems. It's also a good idea to know your test results and keep a list of the medicines you take.  How can you care for yourself at home?  Wear a wide-brimmed hat and long sleeves and pants if you are going to be outdoors for a long time.  Avoid the sun between 10 a.m. and 4 p.m., which is the peak time for UV rays.  Wear sunscreen on exposed skin. Make sure to use a broad-spectrum sunscreen that has a sun protection factor (SPF) of 30 or higher. Use it every day, even when it is cloudy.  Do not use tanning booths or sunlamps.  Use lip balm or cream that has sun protection factor (SPF) to protect your lips from getting sunburned.  Wear sunglasses that block UV rays.  When should you call for help?  Watch closely for changes in your health, and be sure to contact your doctor if:    You see a change in your skin, such as a growth or mole that:  Grows bigger. This may happen very slowly.  Changes color.  Changes shape.  Starts to bleed easily.     You have swollen glands in your armpits, groin, or neck.     You do not get better as expected.   Where can you learn more?  Go to [facility] at [email or web address] in the Menu. Enter P392 in the search box to learn more about Skin Cancer Prevention: Care Instructions.  Current as of: May 17, 2023  Content Version: 14.5  ?? 2024-2025 [place], Lindsay Webster.   Care instructions adapted under license by [facility]. If you have questions about a medical condition or this instruction, always ask your healthcare professional. [name], [facility], disclaims any warranty or liability for your use of this information.    Patient Education        Actinic Keratosis: Care Instructions  Overview  Actinic keratosis is a skin growth caused by sun damage. It can turn into skin cancer, but this isn't common. Actinic keratoses, also called solar keratoses, are small red, brown, or skin-colored scaly patches. They are  most common on the scalp, face, neck, hands, and forearms.  Your doctor can remove these growths by freezing or scraping them off or by putting medicines on them.  Follow-up care is a key part of your treatment and safety. Be sure to make and go to all appointments, and call your doctor if you are having problems. It's also a good idea to know your test results and keep a list of the medicines you take.  How can you care for yourself at home?  If your doctor told you how to care for the treated area, follow your doctor's instructions. If you did not get instructions, follow this general advice:  Wash around the area with clean water 2 times a day. Don't use hydrogen peroxide or alcohol. They can slow healing.  You may cover the area with a thin layer of petroleum jelly, such as Vaseline, and a nonstick bandage.  Apply more petroleum jelly, and replace the bandage as needed.  Avoid using an antibiotic ointment unless your doctor recommends it.  How can you help prevent it?  To help prevent getting another actinic keratosis:  Always wear sunscreen on exposed skin. Make sure to use a broad-spectrum sunscreen that has a sun protection factor (SPF) of 30 or higher. Use it every day, even when it is cloudy.  Wear long sleeves, a hat, and pants if you are going to be outdoors for a long time.  Avoid the sun between 10 a.m. and 4 p.m., the peak time for UV rays.  Do not use tanning booths or sunlamps.  When should you call for help?  Watch closely for changes in your health, and be sure to contact your doctor if:    You have symptoms of infection, such as:  Increased pain, swelling, warmth, or redness.  Red streaks leading from the area.  Pus draining from the area.  A fever.     You see a change in your skin, such as a spot, growth, or mole that:  Grows bigger. This may happen slowly.  Changes color.  Changes shape.  Starts to bleed easily.     You have a wound that does not heal.   Where can you learn more?  Go to [facility] at [email or web address] in the Menu. Enter L364 in the search box to learn more about Actinic Keratosis: Care Instructions.  Current as of: May 17, 2023  Content Version: 14.5  ?? 2024-2025 [place], Lindsay Webster.   Care instructions adapted under license by [facility]. If you have questions about a medical condition or this instruction, always ask your healthcare professional. [name], [facility], disclaims any warranty or liability for your use of this information.

## 2024-01-03 MED FILL — DUPIXENT 300 MG/2 ML SUBCUTANEOUS PEN INJECTOR: SUBCUTANEOUS | 28 days supply | Qty: 4 | Fill #6

## 2024-01-03 NOTE — Unmapped (Signed)
 [facility] and [facility] Delivery Pharmacy Refill Coordination Note    Lindsay Webster, DOB: August 01, 1960  Phone: [phone] (home)       All above HIPAA information was verified with patient.         01/01/2024     4:42 PM   Specialty Rx Medication Refill Questionnaire   Which Medications would you like refilled and shipped? Dupxent   Please list all current allergies: Same as already on file   Have you missed any doses in the last 30 days? No   Have you had any changes to your medication(s) since your last refill? No   How much of each medication do you have remaining at home? (eg. number of tablets, injections, etc.) 0   If receiving an injectable medication, next injection date is 01/03/2024   Have you experienced any side effects in the last 30 days? No   Please enter the full address (street address, city, state, zip code) where you would like your medication(s) to be delivered to. [address], [place] [address]   Please specify on which day you would like your medication(s) to arrive. Note: if you need your medication(s) within 3 days, please call the pharmacy to schedule your order at [phone]  01/04/2024   Has your insurance changed since your last refill? No   Would you like a pharmacist to call you to discuss your medication(s)? No   Do you require a signature for your package? (Note: if we are billing Medicare Part B or your order contains a controlled substance, we will require a signature) No   I have been provided my out of pocket cost for my medication and approve the pharmacy to charge the amount to my credit card on file. Yes         Completed refill call assessment today to schedule patient's medication shipment from the [facility] and [facility] Delivery Pharmacy [phone]).  All relevant notes have been reviewed.       Confirmed patient received a Conservation officer, historic buildings and a Surveyor, mining with first shipment. The patient will receive a drug information handout for each medication shipped and additional FDA Medication Guides as required.         REFERRAL TO PHARMACIST     Referral to the pharmacist: Not needed      [facility]     Shipping address confirmed in Epic.     Delivery Scheduled: Yes, Expected medication delivery date: 7/24.     Medication will be delivered via UPS to the prescription address in Epic WAM.    Lindsay Webster [facility] and [facility]

## 2024-01-29 NOTE — Unmapped (Signed)
 01/29/2024 - Patient originally requested delivery for 08/29/2023. Delivery is not possible on this date due to incorrect month. I have reached out to the patient and confirmed that delivery on 01/31/2024 is ok.    [facility] and Home Delivery Pharmacy Refill Coordination Note    Lindsay Webster, DOB: March 13, 1961  Phone: [phone] (home)       All above HIPAA information was verified with patient.         01/26/2024    10:17 AM   Specialty Rx Medication Refill Questionnaire   Which Medications would you like refilled and shipped? Dupixant   Please list all current allergies: Same as chart is no changes   Have you missed any doses in the last 30 days? No   Have you had any changes to your medication(s) since your last refill? No   How much of each medication do you have remaining at home? (eg. number of tablets, injections, etc.) 1   If receiving an injectable medication, next injection date is 01/28/2024   Have you experienced any side effects in the last 30 days? No   Please enter the full address (street address, city, state, zip code) where you would like your medication(s) to be delivered to. [address], [place], [place] [address]   Please specify on which day you would like your medication(s) to arrive. Note: if you need your medication(s) within 3 days, please call the pharmacy to schedule your order at [phone]  08/29/2023   Has your insurance changed since your last refill? No   Would you like a pharmacist to call you to discuss your medication(s)? No   Do you require a signature for your package? (Note: if we are billing Medicare Part B or your order contains a controlled substance, we will require a signature) No   I have been provided my out of pocket cost for my medication and approve the pharmacy to charge the amount to my credit card on file. Yes   Additional Comments: Thank you so much!  This method is working way better than before.  Im able to not go 3 and 4 weeks without a shot!  It us  really making a difference in my skin and healing time on open wounds!         Completed refill call assessment today to schedule patient's medication shipment from the [facility] and [facility] Delivery Pharmacy [phone]).  All relevant notes have been reviewed.       Confirmed patient received a Conservation officer, historic buildings and a Surveyor, mining with first shipment. The patient will receive a drug information handout for each medication shipped and additional FDA Medication Guides as required.         REFERRAL TO PHARMACIST     Referral to the pharmacist: Not needed      [facility]     Shipping address confirmed in Epic.     Delivery Scheduled: Yes, Expected medication delivery date: 01/31/2024.     Medication will be delivered via UPS to the prescription address in Epic WAM.    Lindsay Webster   [facility] Special[facility] and Home Delivery Pharmacy Specialty Technician

## 2024-01-30 MED FILL — DUPIXENT 300 MG/2 ML SUBCUTANEOUS PEN INJECTOR: SUBCUTANEOUS | 28 days supply | Qty: 4 | Fill #7

## 2024-02-26 NOTE — Unmapped (Signed)
 Lindsay Webster was previously doing very well on therapy but has recently had some flares of her skin in reaction to dog scratches. She'll plan for followup soon to address.     [facility] Special[facility] and Home Delivery Pharmacy Clinical Assessment & Refill Coordination Note    Lindsay Webster, DOB: 1960-08-04  Phone: [phone] (home)     All above HIPAA information was verified with patient.     Was a Nurse, learning disability used for this call? No    Specialty Medication(s):   Inflammatory Disorders: Dupixent      Current Medications[1]     Changes to medications: Rishita reports no changes at this time.    Medication list has been reviewed and updated in Epic: Yes    Allergies[2]    Changes to allergies: No    Allergies have been reviewed and updated in Epic: Yes    SPECIALTY MEDICATION ADHERENCE     Dupixent  300 mg: 0 doses of medicine on hand   Medication Adherence    Patient reported X missed doses in the last month: 0  Specialty Medication: Dupixent           Specialty medication(s) dose(s) confirmed: Regimen is correct and unchanged.     Are there any concerns with adherence? No    Adherence counseling provided? Not needed    CLINICAL MANAGEMENT AND INTERVENTION      Clinical Benefit Assessment:    Do you feel the medicine is effective or helping your condition? Was previously - has had some issues with skin lately - unclear if related to prurigo or another concern    Clinical Benefit counseling provided? Not needed    Adverse Effects Assessment:    Are you experiencing any side effects? No    Are you experiencing difficulty administering your medicine? No    Quality of Life Assessment:    Quality of Life    Rheumatology  Oncology  Dermatology  1. What impact has your specialty medication had on the symptoms of your skin condition (i.e. itchiness, soreness, stinging)?: Some  2. What impact has your specialty medication had on your comfort level with your skin?: Some  Cystic Fibrosis          How many days over the past month did your eczema, PN  keep you from your normal activities? For example, brushing your teeth or getting up in the morning. Patient declined to answer    Have you discussed this with your provider? Not needed    Acute Infection Status:    Acute infections noted within Epic:  No active infections    Patient reported infection: None    Therapy Appropriateness:    Is therapy appropriate based on current medication list, adverse reactions, adherence, clinical benefit and progress toward achieving therapeutic goals? Yes, therapy is appropriate and should be continued     Clinical Intervention:    Was an intervention completed as part of this clinical assessment? No    DISEASE/MEDICATION-SPECIFIC INFORMATION      For patients on injectable medications: Next injection is scheduled for 9/28.    Chronic Inflammatory Diseases: Have you experienced any flares in the last month? Yes, Unclear if PN/eczema, but patient with flare of skin issue  Has this been reported to your provider? Yes, Patient has connected with clinic    PATIENT SPECIFIC NEEDS     Does the patient have any physical, cognitive, or cultural barriers? No    Is the patient high risk? No    Does the patient  require physician intervention or other additional services (i.e., nutrition, smoking cessation, social work)? No    Does the patient have an additional or emergency contact listed in their chart? Yes    SOCIAL DETERMINANTS OF HEALTH     At the [facility], we have learned that life circumstances - like trouble affording food, housing, utilities, or transportation can affect the health of many of our patients.   That is why we wanted to ask: are you currently experiencing any life circumstances that are negatively impacting your health and/or quality of life? Patient declined to answer    Social Drivers of Health     Food Insecurity: Low Risk  (10/25/2023)    Received from [facility]    Hunger Vital Sign     Within the past 12 months, you worried that your food would run out before you got money to buy more: Never true     Within the past 12 months, the food you bought just didn't last and you didn't have money to get more. : Never true   Tobacco Use: High Risk (12/27/2023)    Patient History     Smoking Tobacco Use: Every Day     Smokeless Tobacco Use: Never     Passive Exposure: Not on file   Transportation Needs: No Transportation Needs (10/25/2023)    Received from [employer]     In the past 12 months, has lack of reliable transportation kept you from medical appointments, meetings, work or from getting things needed for daily living? : No   Alcohol Use: Not on file   Housing: Low Risk  (10/25/2023)    Received from [facility]    Housing Stability Vital Sign     What is your living situation today?: I have a steady place to live     Think about the place you live. Do you have problems with any of the following? Choose all that apply:: None/None on this list   Physical Activity: Not on file   Utilities: Low Risk  (10/25/2023)    Received from [facility]    Utilities     In the past 12 months has the electric, gas, oil, or water company threatened to shut off services in your home? : No   Stress: Not on file   Interpersonal Safety: Not on file   Substance Use: Not on file (04/17/2023)   Intimate Partner Violence: Unknown (09/17/2021)    Received from [facility]    HITS     Physically Hurt: Not on file     Insult or Talk Down To: Not on file     Threaten Physical Harm: Not on file     Scream or Curse: Not on file   Social Connections: Unknown (10/26/2021)    Received from [facility]    Social Network     Social Network: Not on file   Financial Resource Strain: Not on file   Health Literacy: Not on file   Internet Connectivity: Not on file       Would you be willing to receive help with any of the needs that you have identified today? Not applicable       SHIPPING     Specialty Medication(s) to be Shipped:   Inflammatory Disorders: Dupixent     Other medication(s) to be shipped: No additional medications requested for fill at this time    Specialty Medications not needed at this time: N/A  Changes to insurance: No    Cost and Payment: Patient has a $0 copay, payment information is not required.    Delivery Scheduled: Yes, Expected medication delivery date: 9/18.     Medication will be delivered via UPS to the confirmed prescription address in [facility].    The patient will receive a drug information handout for each medication shipped and additional FDA Medication Guides as required.  Verified that patient has previously received a Conservation officer, historic buildings and a Surveyor, mining.    The patient or caregiver noted above participated in the development of this care plan and knows that they can request review of or adjustments to the care plan at any time.      All of the patient's questions and concerns have been addressed.    Casee Knepp [facility] Specialty Pharmacist         [1]   Current Outpatient Medications   Medication Sig Dispense Refill    albuterol HFA 90 mcg/actuation inhaler 1 puff as needed      ascorbic acid, vitamin C, (VITAMIN C) 1000 MG tablet Take 1 tablet (1,000 mg total) by mouth.      DENTA 5000 PLUS 1.1 % Crea USE DAILY ON TOOTHBRUSH AFTER REGULAR BRUSHING & FLOSSING DON TEAT OR DRINK FOR 30 MINUTES AFTER USE      doxycycline (VIBRA-TABS) 100 MG tablet       DULoxetine (CYMBALTA) 30 MG capsule Take 1 capsule (30 mg total) by mouth.      DULoxetine (CYMBALTA) 60 MG capsule Take 1 capsule (60 mg total) by mouth.      dupilumab  (DUPIXENT  PEN) 300 mg/2 mL pen injector Inject the contents of 1 pen (300 mg) under the skin every fourteen (14) days. 4 mL 11    empty container (SHARPS-A-GATOR DISPOSAL SYSTEM) Misc Use as directed 1 each 0    EPINEPHrine (EPIPEN) 0.3 mg/0.3 mL injection Inject 0.3 mL (0.3 mg total) into the muscle.      famotidine (PEPCID) 40 MG tablet       fluorouracil  (EFUDEX ) 5 % cream Apply twice a day to pre-cancer/early cancer lesion 40 g 11    fremanezumab-vfrm (AJOVY AUTOINJECTOR) 225 mg/1.5 mL AtIn Inject 225 mg under the skin every twenty-eight (28) days.      LORazepam (ATIVAN) 1 MG tablet Take 1 tablet (1 mg total) by mouth daily.  2    meclizine (ANTIVERT) 25 mg tablet Take 1 tablet (25 mg total) by mouth.      metoprolol tartrate (LOPRESSOR) 25 MG tablet TAKE 1 TABLET BY MOUTH TWICE A DAY      mupirocin  (BACTROBAN ) 2 % ointment Apply topically Three (3) times a day. To affected area till healed 22 g 1    MYRBETRIQ 50 mg Tb24 extended-release tablets Take 1 tablet (50 mg total) by mouth daily.  2    naloxegoL (MOVANTIK) tablet       omeprazole  (PRILOSEC) 40 MG capsule TAKE 1 CAPSULE TWICE A DAY 30 MINUTES BEFORE A MEAL 60 capsule 2    polyethylene glycol (MIRALAX) 17 gram packet Take 17 g by mouth daily as needed.      TRIAMCINOLONE  ACETONIDE (NASACORT  NASL) 1 application. into each nostril two (2) times a day as needed.      vitamin E-268 mg, 400 UNIT, 268 mg (400 UNIT) capsule Take 1 capsule (268 mg total) by mouth.       No current facility-administered  medications for this visit.   [2]   Allergies  Allergen Reactions    Bee Pollens Swelling    Clopidogrel Bisulfate Other (See Comments) and Shortness Of Breath     QUESTIONABLE ASSOCIATION WITH SYMPTOMS, Over the course of a week it made her sore throat and difficulty breathing, believes throat was slowly swelling. Starting a z-pack today 11/17/17 to see if sore throat subsides.    Codeine Hives    Penicillins Hives    Sulfa (Sulfonamide Antibiotics) Anaphylaxis and Shortness Of Breath     Other reaction(s): Unknown    Venom-Honey Bee Swelling     Respiratory   Other reaction(s): Unknown    Zonisamide Anaphylaxis     Headache medication    Carisoprodol      Other reaction(s): rash    Penicillin      Other reaction(s): Unknown    Hydrocodone Nausea And Vomiting

## 2024-02-28 MED FILL — DUPIXENT 300 MG/2 ML SUBCUTANEOUS PEN INJECTOR: SUBCUTANEOUS | 28 days supply | Qty: 4 | Fill #8

## 2024-03-11 DIAGNOSIS — L814 Other melanin hyperpigmentation: Principal | ICD-10-CM

## 2024-03-11 DIAGNOSIS — L821 Other seborrheic keratosis: Principal | ICD-10-CM

## 2024-03-11 DIAGNOSIS — L281 Prurigo nodularis: Principal | ICD-10-CM

## 2024-03-11 DIAGNOSIS — Z85828 Personal history of other malignant neoplasm of skin: Principal | ICD-10-CM

## 2024-03-11 MED ORDER — CLOBETASOL 0.05 % TOPICAL OINTMENT
Freq: Two times a day (BID) | TOPICAL | 3 refills | 0.00000 days | Status: CP
Start: 2024-03-11 — End: 2025-03-11

## 2024-03-11 NOTE — Unmapped (Signed)
 Dermatology Note     Assessment and Plan     Eczematous dermatitis with prurigo nodularis, excoriated lesions - chronic, improved with dupilumab  (Dupixent ), though flaring on BUEs today (LOC)  Diagnosis, treatment options, prognosis, risk/ benefit, and side effects of treatment were discussed with the patient.   - Continue dupilumab  300 mg subcutaneous injection every 14 days, started in April 2023. Has been improving with IGA score now at 1 compared to 4. Patient expressed that no refill(s) needed today.   - START Clobetasol 0.05% ointment BID to itchy, raised spots and erosions on the BUEs  - The proper use and side effects of topical steroids were discussed with the patient.  These include skin pigmentary changes, skin atrophy, telangiectasias,  and striae.  The patient knows to discontinue the steroids if any of these side effects occur.  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  - Reinforced importance of photoprotective strategies including liberal and frequent sunscreen use of a broad-spectrum SPF 30 or greater, use of protective clothing, and sun avoidance for prevention of cutaneous malignancy and photoaging.  Counseled patient on the importance of regular self-skin monitoring as well as routine clinical skin examinations as scheduled.     The patient was advised to call for an appointment should any new, changing, or symptomatic lesions develop.     RTC: Return in about 3 weeks (around 04/01/2024) for Recheck. or sooner as needed     Chief Complaint     Chief Complaint   Patient presents with    Lesion Of Concern     Pt coming in for spot on both arms  Pt states areas have gotten worse since last visit- spots are itchy, burning and bleeding.        HPI     Lindsay Webster is a 63 y.o. female who presents as a returning patient (last seen 12/25/2023) [facility] for multiple lesions of concern.     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    Non-healing skin lesions developed from scratches caused by a puppy on July 4th, evolving into ulcers on the arms. They are very itchy. Some lesions are white and hard. A brown square mark on her chest was previously biopsied, and a new similar spot is on her right arm. She uses an antiseptic wash but experiences significant skin sensitivity and peeling. Currently on Dupixent  300 mg biweekly. FU5 was used in the past but not in the last four weeks. Has not used a topical steroid. She is concerned about the rapid progression of skin lesions, which can develop into cancer within six weeks.    Spot on the L ear, treated previously, still present and dry, requesting evaluation.    The patient denies any other new or changing lesions or areas of concern.     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     ROS: Other than symptoms mentioned in the HPI, no fevers, chills, or other skin complaints.     Physical Examination     GENERAL: Well-appearing female in no acute distress, resting comfortably.  NEURO: Alert and oriented, answers questions appropriately  PSYCH: Normal mood and affect  RESP: No increased work of breathing  SKIN: Examination of the ears, face, neck, chest, bilateral upper extremities, bilateral lower extremities, hands, and feet was performed    - scattered excoriated papules and nodules on the BUEs  - linear erosions and abrasions on the posterior and lateral RUE  - square-shaped brown macule on the posterior RUE  - L central pinna with indurated brown plaque, stable from prior evaluation    All areas not commented on are within normal limits or unremarkable

## 2024-03-11 NOTE — Unmapped (Addendum)
 Meet your team:     Your intake nurse is: Rosalia Colonel     Please remember to fill out the survey you will receive after your visit. Your comments help us  continue to improve our care.      Thanks in advance!      [facility] Dermatology Clinical Staff

## 2024-03-15 NOTE — Unmapped (Signed)
 [facility] Specialty and Home Delivery Pharmacy Refill Coordination Note    Lindsay Webster, DOB: 06-Jun-1961  Phone: [phone] (home)       All above HIPAA information was verified with patient.         03/15/2024     1:03 PM   Specialty Rx Medication Refill Questionnaire   Which Medications would you like refilled and shipped? Dupixant   Please list all current allergies: Same as on file   Have you missed any doses in the last 30 days? No   Have you had any changes to your medication(s) since your last refill? No   How much of each medication do you have remaining at home? (eg. number of tablets, injections, etc.) 1 pen   If receiving an injectable medication, next injection date is 03/17/2024   Have you experienced any side effects in the last 30 days? No   Please enter the full address (street address, city, state, zip code) where you would like your medication(s) to be delivered to. [address], [place] [address]   Please specify on which day you would like your medication(s) to arrive. Note: if you need your medication(s) within 3 days, please call the pharmacy to schedule your order at [phone]  03/20/2024   Has your insurance changed since your last refill? No   Would you like a pharmacist to call you to discuss your medication(s)? No   Do you require a signature for your package? (Note: if we are billing Medicare Part B or your order contains a controlled substance, we will require a signature) No   I have been provided my out of pocket cost for my medication and approve the pharmacy to charge the amount to my credit card on file. Yes         Completed refill call assessment today to schedule patient's medication shipment from the [facility] and Home Delivery Pharmacy [phone]).  All relevant notes have been reviewed.       Confirmed patient received a Conservation officer, historic buildings and a Surveyor, mining with first shipment. The patient will receive a drug information handout for each medication shipped and additional FDA Medication Guides as required.         REFERRAL TO PHARMACIST     Referral to the pharmacist: Not needed      [facility]     Shipping address confirmed in Epic.     Delivery Scheduled: Yes, Expected medication delivery date: 03/20/2024.     Medication will be delivered via UPS to the prescription address in Epic WAM.    Chiquita Gavel   [facility] Specialty and Home Delivery Pharmacy Specialty Technician

## 2024-03-19 MED FILL — DUPIXENT 300 MG/2 ML SUBCUTANEOUS PEN INJECTOR: SUBCUTANEOUS | 28 days supply | Qty: 4 | Fill #9

## 2024-04-17 NOTE — Progress Notes (Signed)
 The [facility] has made a second and final attempt to reach this patient to refill the following medication:Dupixent  pen 300mg /55ml.      We have left voicemails on the following phone numbers: [phone], have sent a MyChart message, have sent a text message to the following phone numbers: [phone], and have sent a Mychart questionnaire..    Dates contacted: 10/28, 11/5  Last scheduled delivery: 10/7    The patient may be at risk of non-compliance with this medication. The patient should call the [facility] at [phone]  Option 4, then Option 2: Dermatology, Gastroenterology, Rheumatology to refill medication.    Eleanor JAYSON Randine [facility] and [facility]

## 2024-05-14 NOTE — Progress Notes (Signed)
 [facility] Refill Coordination Note    Specialty Medication(s) to be Shipped:   Inflammatory Disorders: Dupixent     Other medication(s) to be shipped: No additional medications requested for fill at this time    Specialty Medications not needed at this time: N/A     Lindsay Webster, DOB: [birth year]  Phone: [phone] (home)       All above HIPAA information was verified with patient.     Was a nurse, learning disability used for this call? No    Completed refill call assessment today to schedule patient's medication shipment from the [facility] Delivery Pharm[facility]  [phone]).  All relevant notes have been reviewed.     Specialty medication(s) and dose(s) confirmed: Regimen is correct and unchanged.   Changes to medications: temporarily off of all supplements  Changes to insurance: No  New side effects reported not previously addressed with a pharmacist or physician: None reported  Questions for the pharmacist: No    Confirmed patient received a Conservation Officer, Historic Buildings and a Surveyor, Mining with first shipment. The patient will receive a drug information handout for each medication shipped and additional FDA Medication Guides as required.       DISEASE/MEDICATION-SPECIFIC INFORMATION        For patients on injectable medications: Next injection is scheduled for 05/19/24.    SPECIALTY MEDICATION ADHERENCE     Medication Adherence    Patient reported X missed doses in the last month: 0  Specialty Medication: Dupixent  pen 300mg /81ml  Patient is on additional specialty medications: No  Patient is on more than two specialty medications: No              Were doses missed due to medication being on hold? No     DUPIXENT  PEN 300 mg/2 mL pen injector (dupilumab ): 1 doses of medicine on hand       REFERRAL TO PHARMACIST     Referral to the pharmacist: Not needed      [facility]     Shipping address confirmed in Epic.     Cost and Payment: Patient has a $0 copay, payment information is not required.    Delivery Scheduled: Yes, Expected medication delivery date: 05/22/24.     Medication will be delivered via UPS to the prescription address in Epic WAM.    Dena LOISE Dolores   [facility]  Specialty Technician

## 2024-05-21 MED FILL — DUPIXENT 300 MG/2 ML SUBCUTANEOUS PEN INJECTOR: SUBCUTANEOUS | 28 days supply | Qty: 4 | Fill #10

## 2024-06-12 DIAGNOSIS — L309 Dermatitis, unspecified: Principal | ICD-10-CM

## 2024-06-12 DIAGNOSIS — L281 Prurigo nodularis: Principal | ICD-10-CM

## 2024-06-12 MED ORDER — DUPIXENT 300 MG/2 ML SUBCUTANEOUS PEN INJECTOR
SUBCUTANEOUS | 11 refills | 28.00000 days
Start: 2024-06-12 — End: ?

## 2024-06-14 MED ORDER — DUPIXENT 300 MG/2 ML SUBCUTANEOUS PEN INJECTOR
SUBCUTANEOUS | 11 refills | 28.00000 days | Status: CP
Start: 2024-06-14 — End: ?

## 2024-06-14 NOTE — Telephone Encounter (Signed)
 Refill request for Dupixent  300 mg/2 ml pen injector  Last seen in clinic 03/11/24  Request sent for your review

## 2024-06-28 NOTE — Progress Notes (Signed)
 06/28/2024 - Patient originally requested delivery for 06/18/24. Delivery is not possible on this date due to ups restrictions. I have reached out to the patient and confirmed that delivery on 07/03/24 is ok.  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 However, the copayment has increased to $12.65. I have called the patient and collected approval to charge the new amount to their debit/credit card.  [facility] Pharm[facility] Refill Coordination Note    Tiffancy Moger, DOB: 25-Aug-1960  Phone: [phone] (home)       All above HIPAA information was verified with patient.         06/14/2024     6:52 PM   Specialty Rx Medication Refill Questionnaire   Which Medications would you like refilled and shipped? Dupixant   Please list all current allergies: Same   Have you missed any doses in the last 30 days? No   Have you had any changes to your medication(s) since your last refill? No   How much of each medication do you have remaining at home? (eg. number of tablets, injections, etc.) 0   If receiving an injectable medication, next injection date is 06/19/2024   Have you experienced any side effects in the last 30 days? No   Please enter the full address (street address, city, state, zip code) where you would like your medication(s) to be delivered to. [address], [place], [place] [address]   Please specify on which day you would like your medication(s) to arrive. Note: if you need your medication(s) within 3 days, please call the pharmacy to schedule your order at [phone]  06/18/2024   Has your insurance changed since your last refill? No   Would you like a pharmacist to call you to discuss your medication(s)? No   Do you require a signature for your package? (Note: if we are billing Medicare Part B or your order contains a controlled substance, we will require a signature) No   I have been provided my out of pocket cost for my medication and approve the pharmacy to charge the amount to my credit card on file. Yes         Completed refill call assessment today to schedule patient's medication shipment from the [facility] Pharm[facility] [phone]).  All relevant notes have been reviewed.       Confirmed patient received a Conservation Officer, Historic Buildings and a Surveyor, Mining with first shipment. The patient will receive a drug information handout for each medication shipped and additional FDA Medication Guides as required.         REFERRAL TO PHARMACIST     Referral to the pharmacist: Not needed      [facility]     Shipping address confirmed in Epic.     Delivery Scheduled: Yes, Expected medication delivery date: 07/03/24.     Medication will be delivered via UPS to the prescription address in Epic WAM.    Dena LOISE Dolores   [facility] Specialty Technician

## 2024-07-02 NOTE — Progress Notes (Signed)
 Lindsay Webster 's dupixent  shipment will be delayed as a result of credit card ending in [phone] declined     I have reached out to the patient  at [phone] and left a voicemail message.  We will wait for a call back from the patient to reschedule the delivery.  We have not confirmed the new delivery date.

## 2024-07-12 NOTE — Progress Notes (Signed)
 Lindsay Webster 's dupixent  shipment will be canceled as a result of credit card ending in [phone] declined     I have reached out to the patient  at [phone] and left a voicemail message.  We will not reschedule the medication and have removed this/these medication(s) from the work request.  We have canceled this work request.
